# Patient Record
Sex: Female | Born: 1975
Health system: Southern US, Community
[De-identification: ages and names within clinical notes are randomized; demographics above are authoritative.]

## PROBLEM LIST (undated history)

## (undated) DIAGNOSIS — F32A Depression, unspecified: Secondary | ICD-10-CM

## (undated) DIAGNOSIS — G43909 Migraine, unspecified, not intractable, without status migrainosus: Secondary | ICD-10-CM

## (undated) DIAGNOSIS — F419 Anxiety disorder, unspecified: Secondary | ICD-10-CM

## (undated) DIAGNOSIS — D571 Sickle-cell disease without crisis: Secondary | ICD-10-CM

## (undated) DIAGNOSIS — F329 Major depressive disorder, single episode, unspecified: Secondary | ICD-10-CM

## (undated) DIAGNOSIS — B009 Herpesviral infection, unspecified: Secondary | ICD-10-CM

## (undated) DIAGNOSIS — I10 Essential (primary) hypertension: Secondary | ICD-10-CM

## (undated) HISTORY — DX: Major depressive disorder, single episode, unspecified: F32.9

## (undated) HISTORY — DX: Essential (primary) hypertension: I10

## (undated) HISTORY — DX: Depression, unspecified: F32.A

## (undated) HISTORY — DX: Migraine, unspecified, not intractable, without status migrainosus: G43.909

## (undated) HISTORY — PX: ENDOMETRIAL ABLATION: SHX621

## (undated) HISTORY — PX: WISDOM TOOTH EXTRACTION: SHX21

## (undated) HISTORY — DX: Anxiety disorder, unspecified: F41.9

## (undated) HISTORY — DX: Sickle-cell disease without crisis: D57.1

## (undated) HISTORY — DX: Herpesviral infection, unspecified: B00.9

---

## 1999-07-12 ENCOUNTER — Other Ambulatory Visit: Admission: RE | Admit: 1999-07-12 | Discharge: 1999-07-12 | Payer: Self-pay | Admitting: Obstetrics and Gynecology

## 1999-09-05 ENCOUNTER — Encounter (INDEPENDENT_AMBULATORY_CARE_PROVIDER_SITE_OTHER): Payer: Self-pay

## 1999-09-05 ENCOUNTER — Other Ambulatory Visit: Admission: RE | Admit: 1999-09-05 | Discharge: 1999-09-05 | Payer: Self-pay | Admitting: *Deleted

## 2000-02-06 ENCOUNTER — Other Ambulatory Visit: Admission: RE | Admit: 2000-02-06 | Discharge: 2000-02-06 | Payer: Self-pay | Admitting: *Deleted

## 2000-02-07 ENCOUNTER — Encounter (INDEPENDENT_AMBULATORY_CARE_PROVIDER_SITE_OTHER): Payer: Self-pay | Admitting: Specialist

## 2000-02-07 ENCOUNTER — Other Ambulatory Visit: Admission: RE | Admit: 2000-02-07 | Discharge: 2000-02-07 | Payer: Self-pay | Admitting: *Deleted

## 2001-08-11 ENCOUNTER — Other Ambulatory Visit: Admission: RE | Admit: 2001-08-11 | Discharge: 2001-08-11 | Payer: Self-pay | Admitting: Internal Medicine

## 2002-05-30 ENCOUNTER — Encounter: Admission: RE | Admit: 2002-05-30 | Discharge: 2002-05-30 | Payer: Self-pay | Admitting: Internal Medicine

## 2002-05-30 ENCOUNTER — Encounter: Payer: Self-pay | Admitting: Internal Medicine

## 2002-05-30 ENCOUNTER — Encounter (INDEPENDENT_AMBULATORY_CARE_PROVIDER_SITE_OTHER): Payer: Self-pay | Admitting: Specialist

## 2002-11-18 ENCOUNTER — Encounter: Payer: Self-pay | Admitting: Obstetrics & Gynecology

## 2002-11-18 ENCOUNTER — Ambulatory Visit (HOSPITAL_COMMUNITY): Admission: RE | Admit: 2002-11-18 | Discharge: 2002-11-18 | Payer: Self-pay | Admitting: Obstetrics & Gynecology

## 2008-08-30 ENCOUNTER — Inpatient Hospital Stay (HOSPITAL_COMMUNITY): Admission: AD | Admit: 2008-08-30 | Discharge: 2008-08-31 | Payer: Self-pay | Admitting: Obstetrics and Gynecology

## 2008-09-02 ENCOUNTER — Inpatient Hospital Stay (HOSPITAL_COMMUNITY): Admission: AD | Admit: 2008-09-02 | Discharge: 2008-09-02 | Payer: Self-pay | Admitting: Obstetrics and Gynecology

## 2009-11-29 ENCOUNTER — Inpatient Hospital Stay (HOSPITAL_COMMUNITY): Admission: AD | Admit: 2009-11-29 | Discharge: 2009-11-29 | Payer: Self-pay | Admitting: Obstetrics and Gynecology

## 2009-12-29 ENCOUNTER — Inpatient Hospital Stay (HOSPITAL_COMMUNITY): Admission: AD | Admit: 2009-12-29 | Discharge: 2009-12-29 | Payer: Self-pay | Admitting: Obstetrics and Gynecology

## 2010-01-03 ENCOUNTER — Observation Stay (HOSPITAL_COMMUNITY): Admission: RE | Admit: 2010-01-03 | Discharge: 2010-01-03 | Payer: Self-pay | Admitting: Obstetrics and Gynecology

## 2010-01-06 ENCOUNTER — Observation Stay (HOSPITAL_COMMUNITY): Admission: AD | Admit: 2010-01-06 | Discharge: 2010-01-07 | Payer: Self-pay | Admitting: Obstetrics and Gynecology

## 2010-01-22 ENCOUNTER — Inpatient Hospital Stay (HOSPITAL_COMMUNITY): Admission: RE | Admit: 2010-01-22 | Discharge: 2010-01-24 | Payer: Self-pay | Admitting: Obstetrics and Gynecology

## 2010-01-23 ENCOUNTER — Encounter (INDEPENDENT_AMBULATORY_CARE_PROVIDER_SITE_OTHER): Payer: Self-pay | Admitting: Obstetrics and Gynecology

## 2010-07-25 LAB — COMPREHENSIVE METABOLIC PANEL
ALT: 13 U/L (ref 0–35)
AST: 30 U/L (ref 0–37)
Albumin: 2.7 g/dL — ABNORMAL LOW (ref 3.5–5.2)
CO2: 26 mEq/L (ref 19–32)
Chloride: 105 mEq/L (ref 96–112)
GFR calc Af Amer: >60 mL/min (ref >60)
GFR calc non Af Amer: >60 mL/min (ref >60)
Potassium: 3.8 mEq/L (ref 3.5–5.1)
Sodium: 139 mEq/L (ref 135–145)
Total Bilirubin: 0.8 mg/dL (ref 0.3–1.2)

## 2010-07-25 LAB — CBC
Hemoglobin: 11.1 g/dL — ABNORMAL LOW (ref 12.0–15.0)
Hemoglobin: 12.2 g/dL (ref 12.0–15.0)
MCH: 33.4 pg (ref 26.0–34.0)
MCHC: 33.4 g/dL (ref 30.0–36.0)
MCV: 99.9 fL (ref 78.0–100.0)
Platelets: 180 10*3/uL (ref 150–400)
RBC: 3.66 MIL/uL — ABNORMAL LOW (ref 3.87–5.11)
WBC: 20.2 10*3/uL — ABNORMAL HIGH (ref 4.0–10.5)

## 2010-07-25 LAB — URINALYSIS, ROUTINE W REFLEX MICROSCOPIC
Bilirubin Urine: NEGATIVE
Glucose, UA: NEGATIVE mg/dL
Ketones, ur: 15 mg/dL — AB
Nitrite: NEGATIVE
pH: 7.5 (ref 5.0–8.0)

## 2010-07-25 LAB — URINE MICROSCOPIC-ADD ON

## 2010-07-25 LAB — RPR: RPR Ser Ql: NONREACTIVE

## 2010-07-26 LAB — CBC
MCV: 99.2 fL (ref 78.0–100.0)
Platelets: 167 10*3/uL (ref 150–400)
RBC: 3.42 MIL/uL — ABNORMAL LOW (ref 3.87–5.11)
RDW: 12.2 % (ref 11.5–15.5)
WBC: 12.8 10*3/uL — ABNORMAL HIGH (ref 4.0–10.5)

## 2010-07-26 LAB — URINE MICROSCOPIC-ADD ON

## 2010-07-26 LAB — URINALYSIS, ROUTINE W REFLEX MICROSCOPIC
Bilirubin Urine: NEGATIVE
Glucose, UA: NEGATIVE mg/dL
Hgb urine dipstick: NEGATIVE
Protein, ur: NEGATIVE mg/dL
Urobilinogen, UA: 0.2 mg/dL (ref 0.0–1.0)

## 2010-07-27 LAB — URINE MICROSCOPIC-ADD ON

## 2010-07-27 LAB — URINALYSIS, ROUTINE W REFLEX MICROSCOPIC
Hgb urine dipstick: NEGATIVE
Nitrite: NEGATIVE
Protein, ur: NEGATIVE mg/dL
Specific Gravity, Urine: 1.01 (ref 1.005–1.030)
Urobilinogen, UA: 0.2 mg/dL (ref 0.0–1.0)

## 2010-08-21 LAB — CBC
HCT: 36.5 % (ref 36.0–46.0)
MCHC: 33.9 g/dL (ref 30.0–36.0)
MCV: 96.2 fL (ref 78.0–100.0)
Platelets: 281 10*3/uL (ref 150–400)
RDW: 11.4 % — ABNORMAL LOW (ref 11.5–15.5)
WBC: 7.1 10*3/uL (ref 4.0–10.5)

## 2010-08-21 LAB — HCG, QUANTITATIVE, PREGNANCY: hCG, Beta Chain, Quant, S: 85 m[IU]/mL — ABNORMAL HIGH (ref <5)

## 2010-08-21 LAB — TYPE AND SCREEN

## 2013-02-07 ENCOUNTER — Other Ambulatory Visit: Payer: Self-pay | Admitting: Obstetrics and Gynecology

## 2013-02-07 ENCOUNTER — Other Ambulatory Visit (HOSPITAL_COMMUNITY)
Admission: RE | Admit: 2013-02-07 | Discharge: 2013-02-07 | Disposition: A | Discharge status: Home / Self Care | Payer: No Typology Code available for payment source | Source: Ambulatory Visit | Attending: Obstetrics and Gynecology | Admitting: Obstetrics and Gynecology

## 2013-02-07 DIAGNOSIS — Z01419 Encounter for gynecological examination (general) (routine) without abnormal findings: Secondary | ICD-10-CM | POA: Insufficient documentation

## 2013-02-07 DIAGNOSIS — Z1151 Encounter for screening for human papillomavirus (HPV): Secondary | ICD-10-CM | POA: Insufficient documentation

## 2013-03-23 ENCOUNTER — Telehealth: Payer: Self-pay | Admitting: *Deleted

## 2013-03-23 NOTE — Telephone Encounter (Signed)
Coumadin orders faxed to [facility].

## 2014-07-12 ENCOUNTER — Ambulatory Visit (INDEPENDENT_AMBULATORY_CARE_PROVIDER_SITE_OTHER): Payer: Managed Care, Other (non HMO) | Admitting: Physician Assistant

## 2014-07-12 VITALS — BP 118/78 | HR 93 | Temp 98.3°F | Resp 16 | Ht 65.5 in | Wt 141.0 lb

## 2014-07-12 DIAGNOSIS — M7989 Other specified soft tissue disorders: Secondary | ICD-10-CM | POA: Diagnosis not present

## 2014-07-12 DIAGNOSIS — R519 Headache, unspecified: Secondary | ICD-10-CM

## 2014-07-12 DIAGNOSIS — R252 Cramp and spasm: Secondary | ICD-10-CM | POA: Diagnosis not present

## 2014-07-12 DIAGNOSIS — I10 Essential (primary) hypertension: Secondary | ICD-10-CM | POA: Diagnosis not present

## 2014-07-12 DIAGNOSIS — D573 Sickle-cell trait: Secondary | ICD-10-CM | POA: Diagnosis not present

## 2014-07-12 DIAGNOSIS — R42 Dizziness and giddiness: Secondary | ICD-10-CM | POA: Diagnosis not present

## 2014-07-12 DIAGNOSIS — R51 Headache: Secondary | ICD-10-CM

## 2014-07-12 LAB — BASIC METABOLIC PANEL
BUN: 9 mg/dL (ref 6–23)
CALCIUM: 9.5 mg/dL (ref 8.4–10.5)
CO2: 29 mEq/L (ref 19–32)
CREATININE: 0.65 mg/dL (ref 0.50–1.10)
Chloride: 102 mEq/L (ref 96–112)
Glucose, Bld: 86 mg/dL (ref 70–99)
Potassium: 3.8 mEq/L (ref 3.5–5.3)
Sodium: 139 mEq/L (ref 135–145)

## 2014-07-12 LAB — POCT UA - MICROSCOPIC ONLY
Bacteria, U Microscopic: NEGATIVE
CASTS, UR, LPF, POC: NEGATIVE
CRYSTALS, UR, HPF, POC: NEGATIVE
Mucus, UA: NEGATIVE
YEAST UA: NEGATIVE

## 2014-07-12 LAB — POCT URINALYSIS DIPSTICK
Bilirubin, UA: NEGATIVE
GLUCOSE UA: NEGATIVE
Ketones, UA: NEGATIVE
Leukocytes, UA: NEGATIVE
NITRITE UA: NEGATIVE
PH UA: 8
Protein, UA: NEGATIVE
RBC UA: NEGATIVE
Spec Grav, UA: 1.015
UROBILINOGEN UA: 0.2

## 2014-07-12 MED ORDER — LISINOPRIL-HYDROCHLOROTHIAZIDE 20-25 MG PO TABS: 1.0000 | ORAL_TABLET | Freq: Every day | ORAL | Status: DC

## 2014-07-12 NOTE — Progress Notes (Signed)
Subjective:    Patient ID: Shirley Nunez, female    DOB: 1975-07-14, 39 y.o.   MRN: [id number]  Chief Complaint  Patient presents with  . Headache   Patient Active Problem List   Diagnosis Date Noted  . Essential hypertension 07/12/2014    Prior to Admission medications   Medication Sig Start Date End Date Taking? Authorizing Provider  lisinopril-hydrochlorothiazide (PRINZIDE,ZESTORETIC) 20-25 MG per tablet Take 1 tablet by mouth daily.   Yes Historical Provider, MD   Medications, allergies, past medical history, surgical history, family history, social history and problem list reviewed and updated.  HPI  80 yof with pmh htn presents with headaches, dizziness, and le swelling.   LE swelling - Has hx htn for past 10 yrs. Got worse after birth of her 2nd child. Doesn't think she's been worked up for secondary causes. Both parents have htn. Currently well controlled on current med. She feels like sometimes she deals with mild bilateral le swelling over past few yrs. Feels that it has become worse over past few weeks. She doesn't really watch her sodium intake. Has never worn compression stockings. Drinks 4-5 glasses water daily. Denies cp, sob. Checks her bp at home once week runs 120-130s/80-90s.   HA/dizziness - Has had mild bilateral ha across forehead and behind both eyes for past few weeks. They typically get worse as day goes on. Denies unilateral ha. Denies vision changes or muscle weakness. Denies photophobia/phonophobia. Denies worst ha of life. Denies sudden onset. Denies fever, chills. Ibuprofen dulls ha but doesn't take it away. Drinks 2-3 cups coffee day. Drinks occasional 1-2 glasses wine at night. Drinks 4-5 glasses water day. Gets dizziness when she bends forward and brings head back up. Denies palps. Denies tinnitus, hearing loss. Saw optometry one month ago, no changes in vision.   Review of Systems See HPI.     Objective:   Physical Exam  Constitutional: She is  oriented to person, place, and time. She appears well-developed and well-nourished.  Non-toxic appearance. She does not have a sickly appearance. She does not appear ill. No distress.  BP 118/78 mmHg  Pulse 93  Temp(Src) 98.3 F (36.8 C) (Oral)  Resp 16  Ht 5' 5.5" (1.664 m)  Wt 141 lb (63.957 kg)  BMI 23.10 kg/m2  SpO2 100%   HENT:  Right Ear: Tympanic membrane is not erythematous. A middle ear effusion is present.  Left Ear: Tympanic membrane is not erythematous. A middle ear effusion is present.  Nose: No mucosal edema or rhinorrhea. Right sinus exhibits frontal sinus tenderness. Right sinus exhibits no maxillary sinus tenderness. Left sinus exhibits frontal sinus tenderness. Left sinus exhibits no maxillary sinus tenderness.  Mouth/Throat: Mucous membranes are normal. Mucous membranes are not dry.  Mild frontal sinus ttp bilaterally.   Eyes: Conjunctivae and EOM are normal. Pupils are equal, round, and reactive to light.  Neck: No Brudzinski's sign noted.  Cardiovascular: Normal rate, regular rhythm and normal heart sounds.  Exam reveals no gallop and no decreased pulses.   No murmur heard. Pulmonary/Chest: Effort normal and breath sounds normal.  Musculoskeletal:       Right lower leg: She exhibits no swelling and no edema.       Left lower leg: She exhibits no swelling and no edema.  No bilateral le edema.   Neurological: She is alert and oriented to person, place, and time. She has normal strength. No cranial nerve deficit or sensory deficit. She displays a negative Romberg sign.  Coordination and gait normal.  Normal rapid alternating movements, heel to shin.  Negative dix-halpike testing.   Skin: Skin is warm and dry. She is not diaphoretic.  No increased turgor.   Psychiatric: She has a normal mood and affect. Her speech is normal and behavior is normal.   Results for orders placed or performed in visit on 07/12/14  POCT urinalysis dipstick  Result Value Ref Range    Color, UA yellow    Clarity, UA clear    Glucose, UA neg    Bilirubin, UA neg    Ketones, UA neg    Spec Grav, UA 1.015    Blood, UA neg    pH, UA 8.0    Protein, UA neg    Urobilinogen, UA 0.2    Nitrite, UA neg    Leukocytes, UA Negative   POCT UA - Microscopic Only  Result Value Ref Range   WBC, Ur, HPF, POC 0-1    RBC, urine, microscopic 0-3    Bacteria, U Microscopic neg    Mucus, UA neg    Epithelial cells, urine per micros 0-6    Crystals, Ur, HPF, POC neg    Casts, Ur, LPF, POC neg    Yeast, UA neg       Assessment & Plan:   89 yof with pmh htn presents with headaches, dizziness, and le swelling.   Swelling of lower extremity - Plan: POCT urinalysis dipstick, POCT UA - Microscopic Only --no pitting edema on exam --normal hydration on ua --compression stockings/elevate at night/limit sodium/increase water intake --rtc if not resolving  Dizziness - Plan: POCT urinalysis dipstick, POCT UA - Microscopic Only, Basic metabolic panel --normal neuro exam, neg dix-halpike, no assoc palps --low concern for peripheral causes with no tinnitus, hearing loss, low concern for central causes with normal neuro exam --possibly due to mild dehydration as it comes on after lifting head up --rtc if not resolved with below measures   Nonintractable headache, unspecified chronicity pattern, unspecified headache type --not sudden onset, not worst of life, no fever --possibly due to congestion with tm effusions and mild sinus ttp --decongestant next few days, if resolves ha then flonase/zyrtec through allergy season --if no relief with decongestant, could be tension related, hydration and tylenol prn --rtc if no relief with these measures  Cramp of both lower extremities - Plan: Basic metabolic panel --lytes today  Essential hypertension - Plan: lisinopril-hydrochlorothiazide (PRINZIDE,ZESTORETIC) 20-25 MG per tablet --well controlled in clinic and at home per pt --refilled for 6  months --bmp today for bun/cr/k  Julieta Gutting, PA-C Physician Assistant-Certified [facility]  07/12/2014 11:12 AM

## 2014-07-12 NOTE — Patient Instructions (Signed)
I think we should stay on the same blood pressure dose for now. If you notice the swelling increasing, buying some compression stockings may help. You can wear these when you're on your feet a lot during the day. Keeping your feet elevated at night may help. Watching your sodium intake and drinking at least 8 glasses water daily will help. Continue to monitor your bp at home and let us know if its running >140/90 consistently.  For the headaches, it may help to take a decongestant every day every 6 hours for the next 2-3 days. See if this helps. Tylenol may be a better agent for you to help with the headache.  If they get worse or don't go away, please come back or go to the ED.  If you're not feeling better with these issues in a few weeks, please come back to see [place] for further workup. I'll let you know the results of your lab draw.

## 2014-08-10 ENCOUNTER — Ambulatory Visit (INDEPENDENT_AMBULATORY_CARE_PROVIDER_SITE_OTHER): Payer: Managed Care, Other (non HMO) | Admitting: Sports Medicine

## 2014-08-10 VITALS — BP 110/80 | HR 77 | Temp 98.5°F | Ht 66.0 in | Wt 139.2 lb

## 2014-08-10 DIAGNOSIS — N9089 Other specified noninflammatory disorders of vulva and perineum: Secondary | ICD-10-CM

## 2014-08-10 MED ORDER — BACITRACIN 500 UNIT/GM EX OINT: 1.0000 "application " | TOPICAL_OINTMENT | Freq: Two times a day (BID) | CUTANEOUS | Status: DC

## 2014-08-10 NOTE — Progress Notes (Signed)
  Shirley Nunez - 39 y.o. female MRN [id number]  Date of birth: 08-19-1975  SUBJECTIVE:  Including CC & ROS.  patient C/O: vaginal lesion on the right labial fold Onset of symptoms: 1 week Symptoms:  Patient was concerned she cut herself shaving. She developed a lesion on her labia she is is concerned to be a herpes outbreak, she has NO history of herpes. She describes a tenderness but no burning. Relieving factors: Nothing applied topically  Worsened by: contact cause more pain no pain with urination No new sexual partners in monogamous married for 13 years Denies any vaginal discharge, no pelvic pain, no urinary urgency/ frequent/ or dysuria.    ROS:  Constitutional:  No fever, chills, or fatigue.  Respiratory:  No shortness of breath, cough, or wheezing Cardiovascular:  No palpitations, chest pain or syncope Gastrointestinal:  No nausea, no abdominal pain Review of systems otherwise negative except for what is stated in HPI  HISTORY: Past Medical, Surgical, Social, and Family History Reviewed & Updated per EMR. Pertinent Historical Findings include: HTN  PHYSICAL EXAM:  VS: BP:110/80 mmHg  HR:77bpm  TEMP:98.5 F (36.9 C)(Oral)  RESP:100 %  HT:5\' 6"  (167.6 cm)   WT:139 lb 4 oz (63.163 kg)  BMI:22.5 PHYSICAL EXAM: General:  Alert and oriented, No acute distress.   HENT:  Normocephalic, Oral mucosa is moist.   Respiratory:  Lungs are clear to auscultation, Respirations are non-labored, Symmetrical chest wall expansion.   Cardiovascular:  Normal rate, Regular rhythm, No murmur, Good pulses equal in all extremities, No edema.   Gastrointestinal:  Soft, Non-tender, Non-distended, Normal bowel sounds, No organomegaly.   Integumentary:  Warm, Dry, No rash.   GU Exam: Labial folds: small erythyma tender follicular irritation, no vesicles, no abcess Vagina: no discharge, no bleeding, no vaginal atrophy or irritation. No evidence of a rectocele, cystocele or enterocele Cervix: not  dilated, no erythema, no cervical motion tenderness, no adnexal tenderness  Neurologic:  Alert, Oriented, No focal defects Psychiatric:  Cooperative, Appropriate mood & affect.    ASSESSMENT & PLAN:  Impression: Bacterial folliculitis of labial fold  Recommendations: - With no history of herpes outbreak, no new sexual partners, and no burning sensation suspect a localized folliculitis from shaving -Viral culture obtain to r/o herpes for sure -Rx for topical bacitracin ointment BID for 1-2 weeks - Abstain from shave and sexual intercourse for 1-2 week -will call with lab results, f/u PRN -Patient decline other STD test she was recently tested for GC/Chlamydia by GYN

## 2014-08-11 NOTE — Addendum Note (Signed)
Addended by: Louie Casa on: 08/11/2014 08:46 AM   Modules accepted: Orders

## 2014-08-16 ENCOUNTER — Other Ambulatory Visit: Payer: Self-pay | Admitting: Sports Medicine

## 2014-08-16 DIAGNOSIS — A6009 Herpesviral infection of other urogenital tract: Secondary | ICD-10-CM | POA: Insufficient documentation

## 2014-08-16 LAB — HERPES SIMPLEX VIRUS CULTURE: ORGANISM ID, BACTERIA: DETECTED

## 2014-08-16 MED ORDER — VALACYCLOVIR HCL 1 G PO TABS: 1000.0000 mg | ORAL_TABLET | Freq: Two times a day (BID) | ORAL | Status: DC

## 2014-08-22 ENCOUNTER — Telehealth: Payer: Self-pay

## 2014-08-22 DIAGNOSIS — A6000 Herpesviral infection of urogenital system, unspecified: Secondary | ICD-10-CM

## 2014-08-22 MED ORDER — ACYCLOVIR 200 MG PO CAPS: 200.0000 mg | ORAL_CAPSULE | Freq: Every day | ORAL | Status: DC

## 2014-08-22 NOTE — Telephone Encounter (Signed)
Pharm faxed note that valtrex/valacyclovir is not covered by pt's ins, and asked for change to acyclovir 200 or 800 mg.

## 2014-08-22 NOTE — Telephone Encounter (Signed)
Rx for acyclovir 200 mg sent to pharmacy.

## 2014-12-09 ENCOUNTER — Other Ambulatory Visit: Payer: Self-pay | Admitting: Physician Assistant

## 2015-05-15 ENCOUNTER — Other Ambulatory Visit: Payer: Self-pay | Admitting: Physician Assistant

## 2015-06-03 ENCOUNTER — Ambulatory Visit (INDEPENDENT_AMBULATORY_CARE_PROVIDER_SITE_OTHER): Payer: BLUE CROSS/BLUE SHIELD | Admitting: Emergency Medicine

## 2015-06-03 VITALS — BP 112/72 | HR 94 | Temp 98.3°F | Resp 18 | Ht 66.0 in | Wt 134.0 lb

## 2015-06-03 DIAGNOSIS — Z0000 Encounter for general adult medical examination without abnormal findings: Secondary | ICD-10-CM | POA: Diagnosis not present

## 2015-06-03 DIAGNOSIS — Z124 Encounter for screening for malignant neoplasm of cervix: Secondary | ICD-10-CM

## 2015-06-03 DIAGNOSIS — I10 Essential (primary) hypertension: Secondary | ICD-10-CM | POA: Diagnosis not present

## 2015-06-03 DIAGNOSIS — Z23 Encounter for immunization: Secondary | ICD-10-CM

## 2015-06-03 DIAGNOSIS — D573 Sickle-cell trait: Secondary | ICD-10-CM | POA: Diagnosis not present

## 2015-06-03 DIAGNOSIS — N898 Other specified noninflammatory disorders of vagina: Secondary | ICD-10-CM

## 2015-06-03 DIAGNOSIS — R634 Abnormal weight loss: Secondary | ICD-10-CM

## 2015-06-03 DIAGNOSIS — R1030 Lower abdominal pain, unspecified: Secondary | ICD-10-CM

## 2015-06-03 LAB — COMPLETE METABOLIC PANEL WITH GFR
ALT: 10 U/L (ref 6–29)
AST: 15 U/L (ref 10–30)
Albumin: 4.2 g/dL (ref 3.6–5.1)
Alkaline Phosphatase: 42 U/L (ref 33–115)
BILIRUBIN TOTAL: 0.5 mg/dL (ref 0.2–1.2)
BUN: 12 mg/dL (ref 7–25)
CO2: 32 mmol/L — ABNORMAL HIGH (ref 20–31)
CREATININE: 0.71 mg/dL (ref 0.50–1.10)
Calcium: 9.4 mg/dL (ref 8.6–10.2)
Chloride: 102 mmol/L (ref 98–110)
GFR, Est Non African American: >89 mL/min (ref >=60)
GLUCOSE: 77 mg/dL (ref 65–99)
Potassium: 3.6 mmol/L (ref 3.5–5.3)
Sodium: 140 mmol/L (ref 135–146)
TOTAL PROTEIN: 7.6 g/dL (ref 6.1–8.1)

## 2015-06-03 LAB — POCT URINALYSIS DIP (MANUAL ENTRY)
BILIRUBIN UA: NEGATIVE
Blood, UA: NEGATIVE
GLUCOSE UA: NEGATIVE
Ketones, POC UA: NEGATIVE
NITRITE UA: NEGATIVE
PH UA: 7
Protein Ur, POC: NEGATIVE
SPEC GRAV UA: 1.015
UROBILINOGEN UA: 1

## 2015-06-03 LAB — POCT WET + KOH PREP: TRICH BY WET PREP: ABSENT

## 2015-06-03 LAB — POCT CBC
GRANULOCYTE PERCENT: 70.3 % (ref 37–80)
HCT, POC: 33.3 % — AB (ref 37.7–47.9)
HEMOGLOBIN: 11.3 g/dL — AB (ref 12.2–16.2)
Lymph, poc: 1.6 (ref 0.6–3.4)
MCH: 32.5 pg — AB (ref 27–31.2)
MCHC: 33.9 g/dL (ref 31.8–35.4)
MCV: 95.8 fL (ref 80–97)
MID (cbc): 0.2 (ref 0–0.9)
MPV: 7.9 fL (ref 0–99.8)
POC Granulocyte: 4.3 (ref 2–6.9)
POC LYMPH PERCENT: 26.2 %L (ref 10–50)
POC MID %: 3.5 %M (ref 0–12)
Platelet Count, POC: 312 10*3/uL (ref 142–424)
RBC: 3.48 M/uL — AB (ref 4.04–5.48)
RDW, POC: 12.2 %
WBC: 6.1 10*3/uL (ref 4.6–10.2)

## 2015-06-03 LAB — POC MICROSCOPIC URINALYSIS (UMFC): Mucus: ABSENT

## 2015-06-03 LAB — GLUCOSE, POCT (MANUAL RESULT ENTRY): POC Glucose: 91 mg/dl (ref 70–99)

## 2015-06-03 LAB — HEPATITIS C ANTIBODY: HCV Ab: NEGATIVE

## 2015-06-03 LAB — HIV ANTIBODY (ROUTINE TESTING W REFLEX): HIV 1&2 Ab, 4th Generation: NONREACTIVE

## 2015-06-03 LAB — POCT URINE PREGNANCY: PREG TEST UR: NEGATIVE

## 2015-06-03 LAB — TSH: TSH: 0.995 u[IU]/mL (ref 0.350–4.500)

## 2015-06-03 MED ORDER — METRONIDAZOLE 500 MG PO TABS: 500.0000 mg | ORAL_TABLET | Freq: Three times a day (TID) | ORAL | Status: DC

## 2015-06-03 MED ORDER — LISINOPRIL-HYDROCHLOROTHIAZIDE 20-25 MG PO TABS: ORAL_TABLET | ORAL | Status: DC

## 2015-06-03 MED ORDER — FLUCONAZOLE 150 MG PO TABS: 150.0000 mg | ORAL_TABLET | Freq: Once | ORAL | Status: DC

## 2015-06-03 NOTE — Progress Notes (Signed)
By signing my name below, I, Judithe Modest, attest that this documentation has been prepared under the direction and in the presence of Nena Jordan, MD. Electronically Signed: Judithe Modest, ER Scribe. 06/03/2015. 9:06 AM.  Chief Complaint:  Chief Complaint  Patient presents with  . Annual Exam  . Medication Refill    lisinopril-HCTZ    HPI: Shirley Nunez is a 40 y.o. female with a past hx of HTN and sickle cell trait who reports to [facility] today reporting for a full physical. She does not currently have an OBGYN. Her OBGYN used to be at [facility]. She is not sure where she would like to be seen for her GYN services now. She does not exercise much though she does walk around all day for work. She has lost 10 lbs in the last two months unintentionally. She has a past hx of uterine oblation and tubal ligation surgeries. She is not UTD on her tetanus and would like a shot. She states her appetite has been normal. She is married and has two children who are 49 and 57 y/o.  She is also complaining of a watery vaginal discharge for the last week. She denies dysuria or any other urinary sx.   Past Medical History  Diagnosis Date  . Anxiety   . Hypertension   . Sickle cell anemia (HCC)     Sickle cell trait   History reviewed. No pertinent past surgical history. Social History   Social History  . Marital Status: Married    Spouse Name: N/A  . Number of Children: N/A  . Years of Education: N/A   Social History Main Topics  . Smoking status: Never Smoker   . Smokeless tobacco: None  . Alcohol Use: 1.2 oz/week    0 Standard drinks or equivalent, 2 Glasses of wine per week  . Drug Use: No  . Sexual Activity: Not Asked   Other Topics Concern  . None   Social History Narrative   Family History  Problem Relation Age of Onset  . Hypertension Mother   . Hypertension Father   . Hyperlipidemia Father   . Diabetes Father    Allergies  Allergen Reactions  . Penicillins  Rash   Prior to Admission medications   Medication Sig Start Date End Date Taking? Authorizing Provider  lisinopril-hydrochlorothiazide (PRINZIDE,ZESTORETIC) 20-25 MG tablet TAKE ONE TABLET BY MOUTH ONCE DAILY 05/15/15  Yes [name], PA-C     ROS: The patient denies fevers, chills, night sweats, chest pain, palpitations, wheezing, dyspnea on exertion, nausea, vomiting, abdominal pain, dysuria, hematuria, melena, numbness, weakness, or tingling.  All other systems have been reviewed and were otherwise negative with the exception of those mentioned in the HPI and as above.    PHYSICAL EXAM: Filed Vitals:   06/03/15 0841  BP: 112/72  Pulse: 94  Temp: 98.3 F (36.8 C)  Resp: 18   Body mass index is 21.64 kg/(m^2).   General: Alert, no acute distress HEENT:  Normocephalic, atraumatic, oropharynx patent. Eye: Juliette Mangle [facility] Cardiovascular:  Regular rate and rhythm, no rubs murmurs or gallops.  No Carotid bruits, radial pulse intact. No pedal edema.  Respiratory: Clear to auscultation bilaterally.  No wheezes, rales, or rhonchi.  No cyanosis, no use of accessory musculature Abdominal: No organomegaly, abdomen is soft, positive bowel sounds.  No masses. Mild suprapubic and LLQ tenderness.  Musculoskeletal: Gait intact. No edema, tenderness Skin: No rashes. Neurologic: Facial musculature symmetric. Psychiatric: Patient acts appropriately throughout  our interaction. Lymphatic: No cervical or submandibular lymphadenopathy   LABS: Results for orders placed or performed in visit on 06/03/15  POCT CBC  Result Value Ref Range   WBC 6.1 4.6 - 10.2 K/uL   Lymph, poc 1.6 0.6 - 3.4   POC LYMPH PERCENT 26.2 10 - 50 %L   MID (cbc) 0.2 0 - 0.9   POC MID % 3.5 0 - 12 %M   POC Granulocyte 4.3 2 - 6.9   Granulocyte percent 70.3 37 - 80 %G   RBC 3.48 (A) 4.04 - 5.48 M/uL   Hemoglobin 11.3 (A) 12.2 - 16.2 g/dL   HCT, POC 33.3 (A) 37.7 - 47.9 %   MCV 95.8 80 - 97 fL   MCH, POC 32.5 (A) 27  - 31.2 pg   MCHC 33.9 31.8 - 35.4 g/dL   RDW, POC 12.2 %   Platelet Count, POC 312 142 - 424 K/uL   MPV 7.9 0 - 99.8 fL  POCT urinalysis dipstick  Result Value Ref Range   Color, UA yellow yellow   Clarity, UA clear clear   Glucose, UA negative negative   Bilirubin, UA negative negative   Ketones, POC UA negative negative   Spec Grav, UA 1.015    Blood, UA negative negative   pH, UA 7.0    Protein Ur, POC negative negative   Urobilinogen, UA 1.0    Nitrite, UA Negative Negative   Leukocytes, UA small (1+) (A) Negative  POCT Microscopic Urinalysis ([facility])  Result Value Ref Range   WBC,UR,HPF,POC Few (A) None WBC/hpf   RBC,UR,HPF,POC None None RBC/hpf   Bacteria Few (A) None, Too numerous to count   Mucus Absent Absent   Epithelial Cells, UR Per Microscopy Few (A) None, Too numerous to count cells/hpf  POCT glucose (manual entry)  Result Value Ref Range   POC Glucose 91 70 - 99 mg/dl  POCT Wet + KOH Prep  Result Value Ref Range   Yeast by KOH Present Present, Absent   Yeast by wet prep Present Present, Absent   WBC by wet prep Too numerous to count  None, Few, Too numerous to count   Clue Cells Wet Prep HPF POC Moderate (A) None, Too numerous to count   Trich by wet prep Absent Present, Absent   Bacteria Wet Prep HPF POC Moderate (A) None, Few, Too numerous to count   Epithelial Cells By [employer] Pref ([facility]) Moderate (A) None, Few, Too numerous to count   RBC,UR,HPF,POC None None RBC/hpf    EKG/XRAY:   Primary read interpreted by Dr. Everlene Farrier at [facility].   ASSESSMENT/PLAN: We'll treat with Diflucan and Flagyl. She will continue her lisinopril for blood pressure control. Routine labs were done to evaluate weight loss. Pelvic ultrasound scheduled because of left adnexal discomfort. She declined a flu shot today. Tetanus shot will be given.I personally performed the services described in this documentation, which was scribed in my presence. The recorded information has been reviewed and  is accurate.  Gross sideeffects, risk and benefits, and alternatives of medications d/w patient. Patient is aware that all medications have potential sideeffects and we are unable to predict every sideeffect or drug-drug interaction that may occur.  Arlyss Queen MD 06/03/2015 9:06 AM

## 2015-06-03 NOTE — Progress Notes (Signed)
GYN exam - external genitalia appearance WNL, thick yellow-green discharge in vaginal canal, cervix without erythema or discharge, no CMT or uterine masses - slight TTP left pelvic area but no palpable adnexal masses

## 2015-06-03 NOTE — Patient Instructions (Signed)
Health Maintenance, Female Adopting a healthy lifestyle and getting preventive care can go a long way to promote health and wellness. Talk with your health care provider about what schedule of regular examinations is right for you. This is a good chance for you to check in with your provider about disease prevention and staying healthy. In between checkups, there are plenty of things you can do on your own. Experts have done a lot of research about which lifestyle changes and preventive measures are most likely to keep you healthy. Ask your health care provider for more information. WEIGHT AND DIET  Eat a healthy diet  Be sure to include plenty of vegetables, fruits, low-fat dairy products, and lean protein.  Do not eat a lot of foods high in solid fats, added sugars, or salt.  Get regular exercise. This is one of the most important things you can do for your health.  Most adults should exercise for at least 150 minutes each week. The exercise should increase your heart rate and make you sweat (moderate-intensity exercise).  Most adults should also do strengthening exercises at least twice a week. This is in addition to the moderate-intensity exercise.  Maintain a healthy weight  Body mass index (BMI) is a measurement that can be used to identify possible weight problems. It estimates body fat based on height and weight. Your health care provider can help determine your BMI and help you achieve or maintain a healthy weight.  For females 20 years of age and older:   A BMI below 18.5 is considered underweight.  A BMI of 18.5 to 24.9 is normal.  A BMI of 25 to 29.9 is considered overweight.  A BMI of 30 and above is considered obese.  Watch levels of cholesterol and blood lipids  You should start having your blood tested for lipids and cholesterol at 41 years of age, then have this test every 5 years.  You may need to have your cholesterol levels checked more often if:  Your lipid  or cholesterol levels are high.  You are older than 41 years of age.  You are at high risk for heart disease.  CANCER SCREENING   Lung Cancer  Lung cancer screening is recommended for adults 55-80 years old who are at high risk for lung cancer because of a history of smoking.  A yearly low-dose CT scan of the lungs is recommended for people who:  Currently smoke.  Have quit within the past 15 years.  Have at least a 30-pack-year history of smoking. A pack year is smoking an average of one pack of cigarettes a day for 1 year.  Yearly screening should continue until it has been 15 years since you quit.  Yearly screening should stop if you develop a health problem that would prevent you from having lung cancer treatment.  Breast Cancer  Practice breast self-awareness. This means understanding how your breasts normally appear and feel.  It also means doing regular breast self-exams. Let your health care provider know about any changes, no matter how small.  If you are in your 20s or 30s, you should have a clinical breast exam (CBE) by a health care provider every 1-3 years as part of a regular health exam.  If you are 40 or older, have a CBE every year. Also consider having a breast X-ray (mammogram) every year.  If you have a family history of breast cancer, talk to your health care provider about genetic screening.  If you   are at high risk for breast cancer, talk to your health care provider about having an MRI and a mammogram every year.  Breast cancer gene (BRCA) assessment is recommended for women who have family members with BRCA-related cancers. BRCA-related cancers include:  Breast.  Ovarian.  Tubal.  Peritoneal cancers.  Results of the assessment will determine the need for genetic counseling and BRCA1 and BRCA2 testing. Cervical Cancer Your health care provider may recommend that you be screened regularly for cancer of the pelvic organs (ovaries, uterus, and  vagina). This screening involves a pelvic examination, including checking for microscopic changes to the surface of your cervix (Pap test). You may be encouraged to have this screening done every 3 years, beginning at age 21.  For women ages 30-65, health care providers may recommend pelvic exams and Pap testing every 3 years, or they may recommend the Pap and pelvic exam, combined with testing for human papilloma virus (HPV), every 5 years. Some types of HPV increase your risk of cervical cancer. Testing for HPV may also be done on women of any age with unclear Pap test results.  Other health care providers may not recommend any screening for nonpregnant women who are considered low risk for pelvic cancer and who do not have symptoms. Ask your health care provider if a screening pelvic exam is right for you.  If you have had past treatment for cervical cancer or a condition that could lead to cancer, you need Pap tests and screening for cancer for at least 20 years after your treatment. If Pap tests have been discontinued, your risk factors (such as having a new sexual partner) need to be reassessed to determine if screening should resume. Some women have medical problems that increase the chance of getting cervical cancer. In these cases, your health care provider may recommend more frequent screening and Pap tests. Colorectal Cancer  This type of cancer can be detected and often prevented.  Routine colorectal cancer screening usually begins at 41 years of age and continues through 41 years of age.  Your health care provider may recommend screening at an earlier age if you have risk factors for colon cancer.  Your health care provider may also recommend using home test kits to check for hidden blood in the stool.  A small camera at the end of a tube can be used to examine your colon directly (sigmoidoscopy or colonoscopy). This is done to check for the earliest forms of colorectal  cancer.  Routine screening usually begins at age 50.  Direct examination of the colon should be repeated every 5-10 years through 41 years of age. However, you may need to be screened more often if early forms of precancerous polyps or small growths are found. Skin Cancer  Check your skin from head to toe regularly.  Tell your health care provider about any new moles or changes in moles, especially if there is a change in a mole's shape or color.  Also tell your health care provider if you have a mole that is larger than the size of a pencil eraser.  Always use sunscreen. Apply sunscreen liberally and repeatedly throughout the day.  Protect yourself by wearing long sleeves, pants, a wide-brimmed hat, and sunglasses whenever you are outside. HEART DISEASE, DIABETES, AND HIGH BLOOD PRESSURE   High blood pressure causes heart disease and increases the risk of stroke. High blood pressure is more likely to develop in:  People who have blood pressure in the high end   of the normal range (130-139/85-89 mm Hg).  People who are overweight or obese.  People who are African American.  If you are 38-23 years of age, have your blood pressure checked every 3-5 years. If you are 61 years of age or older, have your blood pressure checked every year. You should have your blood pressure measured twice--once when you are at a hospital or clinic, and once when you are not at a hospital or clinic. Record the average of the two measurements. To check your blood pressure when you are not at a hospital or clinic, you can use:  An automated blood pressure machine at a pharmacy.  A home blood pressure monitor.  If you are between 45 years and 39 years old, ask your health care provider if you should take aspirin to prevent strokes.  Have regular diabetes screenings. This involves taking a blood sample to check your fasting blood sugar level.  If you are at a normal weight and have a low risk for diabetes,  have this test once every three years after 41 years of age.  If you are overweight and have a high risk for diabetes, consider being tested at a younger age or more often. PREVENTING INFECTION  Hepatitis B  If you have a higher risk for hepatitis B, you should be screened for this virus. You are considered at high risk for hepatitis B if:  You were born in a country where hepatitis B is common. Ask your health care provider which countries are considered high risk.  Your parents were born in a high-risk country, and you have not been immunized against hepatitis B (hepatitis B vaccine).  You have HIV or AIDS.  You use needles to inject street drugs.  You live with someone who has hepatitis B.  You have had sex with someone who has hepatitis B.  You get hemodialysis treatment.  You take certain medicines for conditions, including cancer, organ transplantation, and autoimmune conditions. Hepatitis C  Blood testing is recommended for:  Everyone born from [birth year] through 1966.  Anyone with known risk factors for hepatitis C. Sexually transmitted infections (STIs)  You should be screened for sexually transmitted infections (STIs) including gonorrhea and chlamydia if:  You are sexually active and are younger than 41 years of age.  You are older than 41 years of age and your health care provider tells you that you are at risk for this type of infection.  Your sexual activity has changed since you were last screened and you are at an increased risk for chlamydia or gonorrhea. Ask your health care provider if you are at risk.  If you do not have HIV, but are at risk, it may be recommended that you take a prescription medicine daily to prevent HIV infection. This is called pre-exposure prophylaxis (PrEP). You are considered at risk if:  You are sexually active and do not regularly use condoms or know the HIV status of your partner(s).  You take drugs by injection.  You are sexually  active with a partner who has HIV. Talk with your health care provider about whether you are at high risk of being infected with HIV. If you choose to begin PrEP, you should first be tested for HIV. You should then be tested every 3 months for as long as you are taking PrEP.  PREGNANCY   If you are premenopausal and you may become pregnant, ask your health care provider about preconception counseling.  If you may  become pregnant, take 400 to 800 micrograms (mcg) of folic acid every day.  If you want to prevent pregnancy, talk to your health care provider about birth control (contraception). OSTEOPOROSIS AND MENOPAUSE   Osteoporosis is a disease in which the bones lose minerals and strength with aging. This can result in serious bone fractures. Your risk for osteoporosis can be identified using a bone density scan.  If you are 11 years of age or older, or if you are at risk for osteoporosis and fractures, ask your health care provider if you should be screened.  Ask your health care provider whether you should take a calcium or vitamin D supplement to lower your risk for osteoporosis.  Menopause may have certain physical symptoms and risks.  Hormone replacement therapy may reduce some of these symptoms and risks. Talk to your health care provider about whether hormone replacement therapy is right for you.  HOME CARE INSTRUCTIONS   Schedule regular health, dental, and eye exams.  Stay current with your immunizations.   Do not use any tobacco products including cigarettes, chewing tobacco, or electronic cigarettes.  If you are pregnant, do not drink alcohol.  If you are breastfeeding, limit how much and how often you drink alcohol.  Limit alcohol intake to no more than 1 drink per day for nonpregnant women. One drink equals 12 ounces of beer, 5 ounces of wine, or 1 ounces of hard liquor.  Do not use street drugs.  Do not share needles.  Ask your health care provider for help if  you need support or information about quitting drugs.  Tell your health care provider if you often feel depressed.  Tell your health care provider if you have ever been abused or do not feel safe at home.   This information is not intended to replace advice given to you by your health care provider. Make sure you discuss any questions you have with your health care provider.   Document Released: 11/11/2010 Document Revised: 05/19/2014 Document Reviewed: 03/30/2013 Elsevier Interactive Patient Education 2016 Elsevier Inc. Monilial Vaginitis Vaginitis in a soreness, swelling and redness (inflammation) of the vagina and vulva. Monilial vaginitis is not a sexually transmitted infection. CAUSES  Yeast vaginitis is caused by yeast (candida) that is normally found in your vagina. With a yeast infection, the candida has overgrown in number to a point that upsets the chemical balance. SYMPTOMS   White, thick vaginal discharge.  Swelling, itching, redness and irritation of the vagina and possibly the lips of the vagina (vulva).  Burning or painful urination.  Painful intercourse. DIAGNOSIS  Things that may contribute to monilial vaginitis are:  Postmenopausal and virginal states.  Pregnancy.  Infections.  Being tired, sick or stressed, especially if you had monilial vaginitis in the past.  Diabetes. Good control will help lower the chance.  Birth control pills.  Tight fitting garments.  Using bubble bath, feminine sprays, douches or deodorant tampons.  Taking certain medications that kill germs (antibiotics).  Sporadic recurrence can occur if you become ill. TREATMENT  Your caregiver will give you medication.  There are several kinds of anti monilial vaginal creams and suppositories specific for monilial vaginitis. For recurrent yeast infections, use a suppository or cream in the vagina 2 times a week, or as directed.  Anti-monilial or steroid cream for the itching or  irritation of the vulva may also be used. Get your caregiver's permission.  Painting the vagina with methylene blue solution may help if the monilial cream does  not work.  Eating yogurt may help prevent monilial vaginitis. HOME CARE INSTRUCTIONS   Finish all medication as prescribed.  Do not have sex until treatment is completed or after your caregiver tells you it is okay.  Take warm sitz baths.  Do not douche.  Do not use tampons, especially scented ones.  Wear cotton underwear.  Avoid tight pants and panty hose.  Tell your sexual partner that you have a yeast infection. They should go to their caregiver if they have symptoms such as mild rash or itching.  Your sexual partner should be treated as well if your infection is difficult to eliminate.  Practice safer sex. Use condoms.  Some vaginal medications cause latex condoms to fail. Vaginal medications that harm condoms are:  Cleocin cream.  Butoconazole (Femstat).  Terconazole (Terazol) vaginal suppository.  Miconazole (Monistat) (may be purchased over the counter). SEEK MEDICAL CARE IF:   You have a temperature by mouth above 102 F (38.9 C).  The infection is getting worse after 2 days of treatment.  The infection is not getting better after 3 days of treatment.  You develop blisters in or around your vagina.  You develop vaginal bleeding, and it is not your menstrual period.  You have pain when you urinate.  You develop intestinal problems.  You have pain with sexual intercourse.   This information is not intended to replace advice given to you by your health care provider. Make sure you discuss any questions you have with your health care provider.   Document Released: 02/05/2005 Document Revised: 07/21/2011 Document Reviewed: 10/30/2014 Elsevier Interactive Patient Education 2016 Elsevier Inc. Bacterial Vaginosis Bacterial vaginosis is a vaginal infection that occurs when the normal balance of  bacteria in the vagina is disrupted. It results from an overgrowth of certain bacteria. This is the most common vaginal infection in women of childbearing age. Treatment is important to prevent complications, especially in pregnant women, as it can cause a premature delivery. CAUSES  Bacterial vaginosis is caused by an increase in harmful bacteria that are normally present in smaller amounts in the vagina. Several different kinds of bacteria can cause bacterial vaginosis. However, the reason that the condition develops is not fully understood. RISK FACTORS Certain activities or behaviors can put you at an increased risk of developing bacterial vaginosis, including:  Having a new sex partner or multiple sex partners.  Douching.  Using an intrauterine device (IUD) for contraception. Women do not get bacterial vaginosis from toilet seats, bedding, swimming pools, or contact with objects around them. SIGNS AND SYMPTOMS  Some women with bacterial vaginosis have no signs or symptoms. Common symptoms include:  Grey vaginal discharge.  A fishlike odor with discharge, especially after sexual intercourse.  Itching or burning of the vagina and vulva.  Burning or pain with urination. DIAGNOSIS  Your health care provider will take a medical history and examine the vagina for signs of bacterial vaginosis. A sample of vaginal fluid may be taken. Your health care provider will look at this sample under a microscope to check for bacteria and abnormal cells. A vaginal pH test may also be done.  TREATMENT  Bacterial vaginosis may be treated with antibiotic medicines. These may be given in the form of a pill or a vaginal cream. A second round of antibiotics may be prescribed if the condition comes back after treatment. Because bacterial vaginosis increases your risk for sexually transmitted diseases, getting treated can help reduce your risk for chlamydia, gonorrhea, HIV, and herpes.  HOME CARE INSTRUCTIONS    Only take over-the-counter or prescription medicines as directed by your health care provider.  If antibiotic medicine was prescribed, take it as directed. Make sure you finish it even if you start to feel better.  Tell all sexual partners that you have a vaginal infection. They should see their health care provider and be treated if they have problems, such as a mild rash or itching.  During treatment, it is important that you follow these instructions:  Avoid sexual activity or use condoms correctly.  Do not douche.  Avoid alcohol as directed by your health care provider.  Avoid breastfeeding as directed by your health care provider. SEEK MEDICAL CARE IF:   Your symptoms are not improving after 3 days of treatment.  You have increased discharge or pain.  You have a fever. MAKE SURE YOU:   Understand these instructions.  Will watch your condition.  Will get help right away if you are not doing well or get worse. FOR MORE INFORMATION  Centers for Disease Control and Prevention, Division of STD Prevention: [employer] American Sexual Health Association (ASHA): www.ashastd.org    This information is not intended to replace advice given to you by your health care provider. Make sure you discuss any questions you have with your health care provider.   Document Released: 04/28/2005 Document Revised: 05/19/2014 Document Reviewed: 12/08/2012 Elsevier Interactive Patient Education [employer].

## 2015-06-04 ENCOUNTER — Telehealth: Payer: Self-pay

## 2015-06-04 NOTE — Telephone Encounter (Signed)
Error

## 2015-06-05 LAB — PAP IG, CT-NG NAA, HPV HIGH-RISK
Chlamydia Probe Amp: NOT DETECTED
GC Probe Amp: NOT DETECTED
HPV DNA High Risk: NOT DETECTED

## 2015-06-06 ENCOUNTER — Other Ambulatory Visit: Payer: Self-pay

## 2015-06-06 DIAGNOSIS — R1030 Lower abdominal pain, unspecified: Secondary | ICD-10-CM

## 2015-06-20 ENCOUNTER — Telehealth: Payer: Self-pay | Admitting: Family Medicine

## 2015-06-20 ENCOUNTER — Ambulatory Visit
Admission: RE | Admit: 2015-06-20 | Discharge: 2015-06-20 | Disposition: A | Discharge status: Home / Self Care | Payer: BLUE CROSS/BLUE SHIELD | Source: Ambulatory Visit | Attending: Emergency Medicine | Admitting: Emergency Medicine

## 2015-06-20 DIAGNOSIS — N898 Other specified noninflammatory disorders of vagina: Secondary | ICD-10-CM

## 2015-06-20 DIAGNOSIS — R1030 Lower abdominal pain, unspecified: Secondary | ICD-10-CM

## 2015-06-20 NOTE — Telephone Encounter (Signed)
Spoke with patient but she has had her CPE in January with Dr. Everlene Farrier so we went ahead and cancelled the appointment with Debbie on 07/04/15.

## 2015-07-04 ENCOUNTER — Encounter: Payer: Managed Care, Other (non HMO) | Admitting: Family Medicine

## 2015-07-05 ENCOUNTER — Other Ambulatory Visit: Payer: Self-pay | Admitting: Physician Assistant

## 2015-07-14 ENCOUNTER — Other Ambulatory Visit: Payer: Self-pay

## 2015-07-14 DIAGNOSIS — D259 Leiomyoma of uterus, unspecified: Secondary | ICD-10-CM

## 2015-07-25 ENCOUNTER — Ambulatory Visit (INDEPENDENT_AMBULATORY_CARE_PROVIDER_SITE_OTHER): Payer: BLUE CROSS/BLUE SHIELD | Admitting: Family Medicine

## 2015-07-25 VITALS — BP 120/68 | HR 98 | Temp 98.3°F | Resp 20 | Ht 66.0 in | Wt 134.6 lb

## 2015-07-25 DIAGNOSIS — J0100 Acute maxillary sinusitis, unspecified: Secondary | ICD-10-CM

## 2015-07-25 MED ORDER — LEVOFLOXACIN 500 MG PO TABS: 500.0000 mg | ORAL_TABLET | Freq: Every day | ORAL | Status: DC

## 2015-07-25 NOTE — Progress Notes (Signed)
Patient ID: Shirley Nunez, female   DOB: [birth year], 39 y.o.   MRN: [id number]  By signing my name below, I, Essence Howell, attest that this documentation has been prepared under the direction and in the presence of Robyn Haber, MD Electronically Signed: Ladene Artist, ED Scribe 07/25/2015 at 12:28 PM.  Patient ID: Shirley Nunez MRN: [id number], DOB: [birth year], 40 y.o. Date of Encounter: 07/25/2015, 12:16 PM  Primary Physician: No primary care provider on file.  Chief Complaint:  Chief Complaint  Patient presents with  . Sinusitis    hand , foot swelling   HPI: 40 y.o. year old female with history below presents with persistent sinus pressure for the past 2 weeks. Pt states that pain has worsened and now radiates into her jaw. She reports associated bilateral ear pain and nasal congestion for the past 2 weeks as well. Pt has also noticed swelling to her hands and feet for the past month. She reports similar swelling prior to diagnosis of HTN and starting Lisinopril-HCTZ. Allergy to Penicillins.   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   Past Medical History  Diagnosis Date  . Anxiety   . Hypertension   . Sickle cell anemia (HCC)     Sickle cell trait    Home Meds: Prior to Admission medications   Medication Sig Start Date End Date Taking? Authorizing Provider  acyclovir (ZOVIRAX) 200 MG capsule TAKE ONE CAPSULE BY MOUTH FIVE TIMES DAILY 07/07/15  Yes [name], PA-C  lisinopril-hydrochlorothiazide (PRINZIDE,ZESTORETIC) 20-25 MG tablet TAKE ONE TABLET BY MOUTH ONCE DAILY 06/03/15  Yes Darlyne Russian, MD  fluconazole (DIFLUCAN) 150 MG tablet Take 1 tablet (150 mg total) by mouth once. Repeat if needed Patient not taking: Reported on 07/25/2015 06/03/15   Darlyne Russian, MD  metroNIDAZOLE (FLAGYL) 500 MG tablet Take 1 tablet (500 mg total) by mouth 3 (three) times daily. Patient not taking: Reported on 07/25/2015 06/03/15   Darlyne Russian, MD   Allergies:  Allergies  Allergen  Reactions  . Penicillins Rash   Social History   Social History  . Marital Status: Married    Spouse Name: N/A  . Number of Children: N/A  . Years of Education: N/A   Occupational History  . Not on file.   Social History Main Topics  . Smoking status: Never Smoker   . Smokeless tobacco: Not on file  . Alcohol Use: 1.2 oz/week    0 Standard drinks or equivalent, 2 Glasses of wine per week  . Drug Use: No  . Sexual Activity: Not on file   Other Topics Concern  . Not on file   Social History Narrative    Review of Systems Constitutional: negative for chills, fever, night sweats, weight changes, or fatigue  HEENT: negative for vision changes, hearing loss, rhinorrhea, ST, epistaxis, +sinus pressure, +congestion, +ear pain Cardiovascular: negative for chest pain or palpitations Respiratory: negative for hemoptysis, wheezing, shortness of breath, or cough Abdominal: negative for abdominal pain, nausea, vomiting, diarrhea, or constipation Msk: +joint swelling Dermatological: negative for rash Neurologic: negative for headache, dizziness, or syncope All other systems reviewed and are otherwise negative with the exception to those above and in the HPI.  Physical Exam: Blood pressure 120/68, pulse 98, temperature 98.3 F (36.8 C), temperature source Oral, resp. rate 20, height 5\' 6"  (1.676 m), weight 134 lb 9.6 oz (61.054 kg), SpO2 96 %., Body mass index is 21.74 kg/(m^2). General: Well developed, well nourished, in no acute  distress. Head: Normocephalic, atraumatic, eyes without discharge, sclera non-icteric, Swollen nasal passages. Bilateral auditory canals clear, TM's are without perforation, pearly grey and translucent with reflective cone of light bilaterally. Oral cavity moist, posterior pharynx without exudate, erythema, peritonsillar abscess, or post nasal drip.  Neck: Supple. No thyromegaly. Full ROM. No lymphadenopathy. Lungs: Clear bilaterally to auscultation without  wheezes, rales, or rhonchi. Breathing is unlabored. Heart: RRR with S1 S2. No murmurs, rubs, or gallops appreciated. Abdomen: Soft, non-tender, non-distended with normoactive bowel sounds. No hepatomegaly. No rebound/guarding. No obvious abdominal masses. Msk:  Strength and tone normal for age.  Extremities/Skin: Warm and dry. No clubbing or cyanosis. Hands appear normal with no real loss of ROM.No edema or ankle. No rashes or suspicious lesions. Neuro: Alert and oriented X 3. Moves all extremities spontaneously. Gait is normal. CNII-XII grossly in tact. Psych:  Responds to questions appropriately with a normal affect.   ASSESSMENT AND PLAN:  40 y.o. year old female with  1. Acute maxillary sinusitis, recurrence not specified    This chart was scribed in my presence and reviewed by me personally.    ICD-9-CM ICD-10-CM   1. Acute maxillary sinusitis, recurrence not specified 461.0 J01.00 levofloxacin (LEVAQUIN) 500 MG tablet     Signed, Robyn Haber, MD 07/25/2015 12:16 PM

## 2015-07-25 NOTE — Patient Instructions (Addendum)

## 2015-09-21 ENCOUNTER — Other Ambulatory Visit: Payer: Self-pay | Admitting: Physician Assistant

## 2015-11-07 ENCOUNTER — Ambulatory Visit (INDEPENDENT_AMBULATORY_CARE_PROVIDER_SITE_OTHER): Payer: BLUE CROSS/BLUE SHIELD | Admitting: Family Medicine

## 2015-11-07 VITALS — BP 122/72 | HR 84 | Temp 98.6°F | Resp 17 | Ht 65.5 in | Wt 135.0 lb

## 2015-11-07 DIAGNOSIS — R609 Edema, unspecified: Secondary | ICD-10-CM | POA: Diagnosis not present

## 2015-11-07 DIAGNOSIS — R202 Paresthesia of skin: Secondary | ICD-10-CM

## 2015-11-07 DIAGNOSIS — D649 Anemia, unspecified: Secondary | ICD-10-CM

## 2015-11-07 DIAGNOSIS — R200 Anesthesia of skin: Secondary | ICD-10-CM

## 2015-11-07 LAB — COMPLETE METABOLIC PANEL WITH GFR
ALBUMIN: 4.4 g/dL (ref 3.6–5.1)
ALT: 12 U/L (ref 6–29)
AST: 14 U/L (ref 10–30)
Alkaline Phosphatase: 41 U/L (ref 33–115)
BILIRUBIN TOTAL: 0.5 mg/dL (ref 0.2–1.2)
BUN: 14 mg/dL (ref 7–25)
CALCIUM: 9.3 mg/dL (ref 8.6–10.2)
CO2: 29 mmol/L (ref 20–31)
CREATININE: 0.72 mg/dL (ref 0.50–1.10)
Chloride: 100 mmol/L (ref 98–110)
Glucose, Bld: 80 mg/dL (ref 65–99)
Potassium: 3.7 mmol/L (ref 3.5–5.3)
Sodium: 136 mmol/L (ref 135–146)
TOTAL PROTEIN: 7.6 g/dL (ref 6.1–8.1)

## 2015-11-07 LAB — IRON,TIBC AND FERRITIN PANEL
%SAT: 12 % (ref 11–50)
FERRITIN: 80 ng/mL (ref 10–232)
Iron: 34 ug/dL — ABNORMAL LOW (ref 40–190)
TIBC: 293 ug/dL (ref 250–450)

## 2015-11-07 LAB — VITAMIN B12: Vitamin B-12: 664 pg/mL (ref 200–1100)

## 2015-11-07 LAB — FOLATE: Folate: 8.8 ng/mL (ref >5.4)

## 2015-11-07 LAB — SEDIMENTATION RATE: SED RATE: 44 mm/h — AB (ref 0–20)

## 2015-11-07 NOTE — Patient Instructions (Addendum)
  Great to meet you!  We will call or send a letter with your labs within a week  Monitor your salt intake to control swelling.      IF you received an x-ray today, you will receive an invoice from [facility]. Please contact [facility] at [phone] with questions or concerns regarding your invoice.   IF you received labwork today, you will receive an invoice from [employer]. Please contact [facility] at [phone] with questions or concerns regarding your invoice.   Our billing staff will not be able to assist you with questions regarding bills from these companies.  You will be contacted with the lab results as soon as they are available. The fastest way to get your results is to activate your My Chart account. Instructions are located on the last page of this paperwork. If you have not heard from [place] regarding the results in 2 weeks, please contact this office.

## 2015-11-07 NOTE — Progress Notes (Signed)
   HPI  Patient presents today here with swelling of hands and feet, dizziness, and numbness and tingling of the hands and feet  Pt states she has had several month Hx of swelling in Hands and feet with associated numbness and tingling. She states that it is worse in the AM and improves after a few hours. She has cut back on salt and made dietary changes which has not really had any effect  She denies fever, chills, sweats, or symptoms of illness.   She has mild, momentary dizziness when first standing or bending over when she stands that resolves after 1-2 seconds. No room spinning or weakness. NO pre-syncope    PMH: Smoking status noted ROS: Per HPI  Objective: BP 122/72 mmHg  Pulse 84  Temp(Src) 98.6 F (37 C) (Oral)  Resp 17  Ht 5' 5.5" (1.664 m)  Wt 135 lb (61.236 kg)  BMI 22.12 kg/m2  SpO2 100% Gen: NAD, alert, cooperative with exam HEENT: NCAT CV: RRR, good S1/S2, no murmur Resp: CTABL, no wheezes, non-labored Ext: No edema, warm Neuro: Alert and oriented, No gross deficits  Assessment and plan:  # Numbness and tingling, swelling Suspect salt related mild peripheral edema, not appreciated on exam today With Am symps worse than later in the day I did consider rheumatic Disease so I have added ESR and ANA Discussed limiting salt, continue current BP meds, Including HCTZ Consider B12/folate or anemia (previousnormocytic anemia with sickle cell trait   # dizziness Like;ly related to normal fluid shifts and transient decrease in BP with standing  BP WNL, still some elevations sometimes Continue current dose, RTC as needed   Normocytic anemia Repeat labs, Folate, B12, iron  Orders Placed This Encounter  Procedures  . Folate  . Vitamin B12  . Iron, TIBC and Ferritin Panel  . CMP14+EGFR  . POCT CBC    Kenn File, MD 10:09 AM

## 2015-11-09 ENCOUNTER — Telehealth: Payer: Self-pay | Admitting: Family Medicine

## 2015-11-09 DIAGNOSIS — F331 Major depressive disorder, recurrent, moderate: Secondary | ICD-10-CM | POA: Insufficient documentation

## 2015-11-09 DIAGNOSIS — M2560 Stiffness of unspecified joint, not elsewhere classified: Secondary | ICD-10-CM

## 2015-11-09 LAB — ANA, IFA COMPREHENSIVE PANEL
ANA: POSITIVE — AB
DS DNA AB: 1 [IU]/mL
ENA SM Ab Ser-aCnc: <1
SCLERODERMA (SCL-70) (ENA) ANTIBODY, IGG: NEGATIVE
SM/RNP: <1
SSA (RO) (ENA) ANTIBODY, IGG: NEGATIVE
SSB (La) (ENA) Antibody, IgG: <1

## 2015-11-09 LAB — ANTI-NUCLEAR AB-TITER (ANA TITER)

## 2015-11-09 NOTE — Telephone Encounter (Signed)
Called to follow-up for labs. She has a positive sedimentation rate and ANA. The ANA was weakly positive.  On clarification she does have joint stiffness for about 1-2 hours in the morning. She also has muscle pains in the thighs and arms.  Consider rheumatoid arthritis, polymyositis.  Patient will return to the clinic for repeat labs.  Laroy Apple, MD  11/09/2015, 2:52 PM

## 2015-11-14 ENCOUNTER — Ambulatory Visit (INDEPENDENT_AMBULATORY_CARE_PROVIDER_SITE_OTHER): Payer: BLUE CROSS/BLUE SHIELD | Admitting: Family Medicine

## 2015-11-14 DIAGNOSIS — M2560 Stiffness of unspecified joint, not elsewhere classified: Secondary | ICD-10-CM

## 2015-11-15 ENCOUNTER — Telehealth: Payer: Self-pay | Admitting: Family Medicine

## 2015-11-15 DIAGNOSIS — M2560 Stiffness of unspecified joint, not elsewhere classified: Secondary | ICD-10-CM

## 2015-11-15 LAB — CBC WITH DIFFERENTIAL/PLATELET
BASOS: 1 %
Basophils Absolute: 0 10*3/uL (ref 0.0–0.2)
EOS (ABSOLUTE): 0.1 10*3/uL (ref 0.0–0.4)
EOS: 1 %
HEMATOCRIT: 37.8 % (ref 34.0–46.6)
HEMOGLOBIN: 12.2 g/dL (ref 11.1–15.9)
IMMATURE GRANS (ABS): 0 10*3/uL (ref 0.0–0.1)
Immature Granulocytes: 0 %
LYMPHS: 29 %
Lymphocytes Absolute: 1.8 10*3/uL (ref 0.7–3.1)
MCH: 32 pg (ref 26.6–33.0)
MCHC: 32.3 g/dL (ref 31.5–35.7)
MCV: 99 fL — AB (ref 79–97)
Monocytes Absolute: 0.4 10*3/uL (ref 0.1–0.9)
Monocytes: 7 %
NEUTROS ABS: 3.8 10*3/uL (ref 1.4–7.0)
Neutrophils: 62 %
Platelets: 365 10*3/uL (ref 150–379)
RBC: 3.81 x10E6/uL (ref 3.77–5.28)
RDW: 12.2 % — ABNORMAL LOW (ref 12.3–15.4)
WBC: 6.1 10*3/uL (ref 3.4–10.8)

## 2015-11-15 LAB — CK: Total CK: 58 U/L (ref 24–173)

## 2015-11-15 LAB — RHEUMATOID FACTOR

## 2015-11-15 NOTE — Progress Notes (Signed)
Lab only visit  Laroy Apple, MD [facility] 11/15/2015, 12:17 PM

## 2015-11-15 NOTE — Telephone Encounter (Signed)
Patient with nonspecific lab findings of elevated sedimentation rate and positive ANA.  She's had joint stiffness and arm and leg swelling for the last several months that last more than 2 hours of morning. Her rheumatoid factor, CK, anti-smooth muscle, scleroderma, and anti-Ro and anti-la were all negative.  Given findings and persistent symptoms I recommended referral to rheumatology.  Referrals been placed, patient will await phone call from clinic for scheduling.  Laroy Apple, MD [facility] 11/15/2015, 12:08 PM

## 2015-12-05 NOTE — Telephone Encounter (Signed)
This was sent to scheduling.

## 2016-03-19 ENCOUNTER — Other Ambulatory Visit: Payer: Self-pay | Admitting: Emergency Medicine

## 2016-05-23 ENCOUNTER — Other Ambulatory Visit: Payer: Self-pay | Admitting: Emergency Medicine

## 2016-06-25 DIAGNOSIS — Z9889 Other specified postprocedural states: Secondary | ICD-10-CM | POA: Diagnosis not present

## 2016-06-25 DIAGNOSIS — Z01419 Encounter for gynecological examination (general) (routine) without abnormal findings: Secondary | ICD-10-CM | POA: Diagnosis not present

## 2016-06-25 DIAGNOSIS — Z124 Encounter for screening for malignant neoplasm of cervix: Secondary | ICD-10-CM | POA: Diagnosis not present

## 2016-06-25 LAB — HM PAP SMEAR: HM Pap smear: NEGATIVE

## 2016-06-25 LAB — HM HIV SCREENING LAB: HM HIV Screening: NEGATIVE

## 2016-07-04 DIAGNOSIS — Z136 Encounter for screening for cardiovascular disorders: Secondary | ICD-10-CM | POA: Diagnosis not present

## 2016-07-04 DIAGNOSIS — Z713 Dietary counseling and surveillance: Secondary | ICD-10-CM | POA: Diagnosis not present

## 2016-07-07 DIAGNOSIS — J029 Acute pharyngitis, unspecified: Secondary | ICD-10-CM | POA: Diagnosis not present

## 2016-07-10 ENCOUNTER — Other Ambulatory Visit: Payer: Self-pay | Admitting: Emergency Medicine

## 2016-07-10 ENCOUNTER — Ambulatory Visit (INDEPENDENT_AMBULATORY_CARE_PROVIDER_SITE_OTHER): Payer: 59 | Admitting: Emergency Medicine

## 2016-07-10 VITALS — BP 130/66 | HR 89 | Temp 98.7°F | Resp 16 | Ht 65.5 in | Wt 142.4 lb

## 2016-07-10 DIAGNOSIS — I10 Essential (primary) hypertension: Secondary | ICD-10-CM

## 2016-07-10 MED ORDER — LISINOPRIL 20 MG PO TABS: 20.0000 mg | ORAL_TABLET | Freq: Every day | ORAL | 3 refills | Status: DC

## 2016-07-10 NOTE — Patient Instructions (Addendum)
   IF you received an x-ray today, you will receive an invoice from [facility]. Please contact [facility] at [phone] with questions or concerns regarding your invoice.   IF you received labwork today, you will receive an invoice from [facility]. Please contact [facility] at [phone] with questions or concerns regarding your invoice.   Our billing staff will not be able to assist you with questions regarding bills from these companies.  You will be contacted with the lab results as soon as they are available. The fastest way to get your results is to activate your My Chart account. Instructions are located on the last page of this paperwork. If you have not heard from us regarding the results in 2 weeks, please contact this office.      Hypertension Hypertension is another name for high blood pressure. High blood pressure forces your heart to work harder to pump blood. This can cause problems over time. There are two numbers in a blood pressure reading. There is a top number (systolic) over a bottom number (diastolic). It is best to have a blood pressure below 120/80. Healthy choices can help lower your blood pressure. You may need medicine to help lower your blood pressure if:  Your blood pressure cannot be lowered with healthy choices.  Your blood pressure is higher than 130/80. Follow these instructions at home: Eating and drinking   If directed, follow the DASH eating plan. This diet includes:  Filling half of your plate at each meal with fruits and vegetables.  Filling one quarter of your plate at each meal with whole grains. Whole grains include whole wheat pasta, brown rice, and whole grain bread.  Eating or drinking low-fat dairy products, such as skim milk or low-fat yogurt.  Filling one quarter of your plate at each meal with low-fat (lean) proteins. Low-fat proteins include fish, skinless chicken, eggs, beans, and tofu.  Avoiding fatty meat,  cured and processed meat, or chicken with skin.  Avoiding premade or processed food.  Eat less than 1,500 mg of salt (sodium) a day.  Limit alcohol use to no more than 1 drink a day for nonpregnant women and 2 drinks a day for men. One drink equals 12 oz of beer, 5 oz of wine, or 1 oz of hard liquor. Lifestyle   Work with your doctor to stay at a healthy weight or to lose weight. Ask your doctor what the best weight is for you.  Get at least 30 minutes of exercise that causes your heart to beat faster (aerobic exercise) most days of the week. This may include walking, swimming, or biking.  Get at least 30 minutes of exercise that strengthens your muscles (resistance exercise) at least 3 days a week. This may include lifting weights or pilates.  Do not use any products that contain nicotine or tobacco. This includes cigarettes and e-cigarettes. If you need help quitting, ask your doctor.  Check your blood pressure at home as told by your doctor.  Keep all follow-up visits as told by your doctor. This is important. Medicines   Take over-the-counter and prescription medicines only as told by your doctor. Follow directions carefully.  Do not skip doses of blood pressure medicine. The medicine does not work as well if you skip doses. Skipping doses also puts you at risk for problems.  Ask your doctor about side effects or reactions to medicines that you should watch for. Contact a doctor if:  You think you are having a   reaction to the medicine you are taking.  You have headaches that keep coming back (recurring).  You feel dizzy.  You have swelling in your ankles.  You have trouble with your vision. Get help right away if:  You get a very bad headache.  You start to feel confused.  You feel weak or numb.  You feel faint.  You get very bad pain in your:  Chest.  Belly (abdomen).  You throw up (vomit) more than once.  You have trouble  breathing. Summary  Hypertension is another name for high blood pressure.  Making healthy choices can help lower blood pressure. If your blood pressure cannot be controlled with healthy choices, you may need to take medicine. This information is not intended to replace advice given to you by your health care provider. Make sure you discuss any questions you have with your health care provider. Document Released: 10/15/2007 Document Revised: 03/26/2016 Document Reviewed: 03/26/2016 Elsevier Interactive Patient Education  2017 Elsevier Inc.  

## 2016-07-10 NOTE — Progress Notes (Signed)
Shirley Nunez 41 y.o.   Chief Complaint  Patient presents with  . Medication Refill    Lisinopril    HISTORY OF PRESENT ILLNESS: This is a 41 y.o. female here for HTN f/u. Wishes to decrease dose of medication in the hopes she can one day not have to take any. Asymptomatic; has no complaints.  HPI   Prior to Admission medications   Medication Sig Start Date End Date Taking? Authorizing Provider  lisinopril-hydrochlorothiazide (PRINZIDE,ZESTORETIC) 20-25 MG tablet TAKE ONE TABLET BY MOUTH ONCE DAILY 03/21/16  Yes Shirley Jeffery, PA-C  acyclovir (ZOVIRAX) 200 MG capsule TAKE 1 CAPSULE BY MOUTH 5 TIMES DAILY Patient not taking: Reported on 07/10/2016 09/24/15   Shirley Russian, MD    Allergies  Allergen Reactions  . Penicillins Rash    Patient Active Problem List   Diagnosis Date Noted  . Joint stiffness 11/09/2015  . Herpes simplex of female genitalia 08/16/2014  . Essential hypertension 07/12/2014  . Sickle cell trait ([place]) 07/12/2014    Past Medical History:  Diagnosis Date  . Anxiety   . Hypertension   . Sickle cell anemia (H[facility])    Sickle cell trait    No past surgical history on file.  Social History   Social History  . Marital status: Married    Spouse name: N/A  . Number of children: N/A  . Years of education: N/A   Occupational History  . Not on file.   Social History Main Topics  . Smoking status: Never Smoker  . Smokeless tobacco: Never Used  . Alcohol use 1.2 oz/week    2 Glasses of wine per week  . Drug use: No  . Sexual activity: Not on file   Other Topics Concern  . Not on file   Social History Narrative  . No narrative on file    Family History  Problem Relation Age of Onset  . Hypertension Mother   . Hypertension Father   . Hyperlipidemia Father   . Diabetes Father      Review of Systems  Constitutional: Negative for chills and fever.  HENT: Positive for sore throat (had recent throat infection/laryngitis).   Eyes: Negative  for blurred vision and double vision.  Respiratory: Negative for cough, shortness of breath and wheezing.   Cardiovascular: Negative for chest pain, palpitations and leg swelling.  Gastrointestinal: Negative for abdominal pain, diarrhea, nausea and vomiting.  Genitourinary: Negative for dysuria and hematuria.  Musculoskeletal: Negative for myalgias and neck pain.  Skin: Negative for rash.  Neurological: Negative for dizziness, sensory change, focal weakness and headaches.  Endo/Heme/Allergies: Does not bruise/bleed easily.  All other systems reviewed and are negative.  Vitals:   07/10/16 1610  BP: 130/66  Pulse: 89  Resp: 16  Temp: 98.7 F (37.1 C)     Physical Exam  Constitutional: She is oriented to person, place, and time. She appears well-developed and well-nourished.  HENT:  Head: Normocephalic and atraumatic.  Nose: Nose normal.  Mouth/Throat: Oropharynx is clear and moist. No oropharyngeal exudate.  Eyes: Conjunctivae and EOM are normal. Pupils are equal, round, and reactive to light.  Neck: Normal range of motion. Neck supple. No JVD present. No thyromegaly present.  Cardiovascular: Normal rate, regular rhythm and normal heart sounds.   Pulmonary/Chest: Effort normal and breath sounds normal.  Abdominal: Soft. Bowel sounds are normal. She exhibits no distension. There is no tenderness.  Musculoskeletal: Normal range of motion.  Lymphadenopathy:    She has no cervical adenopathy.  Neurological: She is alert and oriented to person, place, and time. No sensory deficit. She exhibits normal muscle tone. Coordination normal.  Skin: Skin is warm and dry. Capillary refill takes less than 2 seconds.  Psychiatric: She has a normal mood and affect. Her behavior is normal.  Vitals reviewed.    ASSESSMENT & PLAN: Pt wishes to get off medication and will like to try lower dose with the hopes she can taper down to no meds. Will try Lisinopril 20mg  to start with. Advised to monitor  BB several times a day. Shirley Nunez was seen today for medication refill.  Diagnoses and all orders for this visit:  Essential hypertension  Other orders -     lisinopril (PRINIVIL,ZESTRIL) 20 MG tablet; Take 1 tablet (20 mg total) by mouth daily.    Patient Instructions       IF you received an x-ray today, you will receive an invoice from [facility]. Please contact [facility] at [phone] with questions or concerns regarding your invoice.   IF you received labwork today, you will receive an invoice from [facility]. Please contact [facility] at [phone] with questions or concerns regarding your invoice.   Our billing staff will not be able to assist you with questions regarding bills from these companies.  You will be contacted with the lab results as soon as they are available. The fastest way to get your results is to activate your My Chart account. Instructions are located on the last page of this paperwork. If you have not heard from [place] regarding the results in 2 weeks, please contact this office.     Hypertension Hypertension is another name for high blood pressure. High blood pressure forces your heart to work harder to pump blood. This can cause problems over time. There are two numbers in a blood pressure reading. There is a top number (systolic) over a bottom number (diastolic). It is best to have a blood pressure below 120/80. Healthy choices can help lower your blood pressure. You may need medicine to help lower your blood pressure if:  Your blood pressure cannot be lowered with healthy choices.  Your blood pressure is higher than 130/80. Follow these instructions at home: Eating and drinking   If directed, follow the DASH eating plan. This diet includes:  Filling half of your plate at each meal with fruits and vegetables.  Filling one quarter of your plate at each meal with whole grains. Whole grains include whole wheat pasta, brown rice, and  whole grain bread.  Eating or drinking low-fat dairy products, such as skim milk or low-fat yogurt.  Filling one quarter of your plate at each meal with low-fat (lean) proteins. Low-fat proteins include fish, skinless chicken, eggs, beans, and tofu.  Avoiding fatty meat, cured and processed meat, or chicken with skin.  Avoiding premade or processed food.  Eat less than 1,500 mg of salt (sodium) a day.  Limit alcohol use to no more than 1 drink a day for nonpregnant women and 2 drinks a day for men. One drink equals 12 oz of beer, 5 oz of wine, or 1 oz of hard liquor. Lifestyle   Work with your doctor to stay at a healthy weight or to lose weight. Ask your doctor what the best weight is for you.  Get at least 30 minutes of exercise that causes your heart to beat faster (aerobic exercise) most days of the week. This may include walking, swimming, or biking.  Get at least 30 minutes  of exercise that strengthens your muscles (resistance exercise) at least 3 days a week. This may include lifting weights or pilates.  Do not use any products that contain nicotine or tobacco. This includes cigarettes and e-cigarettes. If you need help quitting, ask your doctor.  Check your blood pressure at home as told by your doctor.  Keep all follow-up visits as told by your doctor. This is important. Medicines   Take over-the-counter and prescription medicines only as told by your doctor. Follow directions carefully.  Do not skip doses of blood pressure medicine. The medicine does not work as well if you skip doses. Skipping doses also puts you at risk for problems.  Ask your doctor about side effects or reactions to medicines that you should watch for. Contact a doctor if:  You think you are having a reaction to the medicine you are taking.  You have headaches that keep coming back (recurring).  You feel dizzy.  You have swelling in your ankles.  You have trouble with your vision. Get help  right away if:  You get a very bad headache.  You start to feel confused.  You feel weak or numb.  You feel faint.  You get very bad pain in your:  Chest.  Belly (abdomen).  You throw up (vomit) more than once.  You have trouble breathing. Summary  Hypertension is another name for high blood pressure.  Making healthy choices can help lower blood pressure. If your blood pressure cannot be controlled with healthy choices, you may need to take medicine. This information is not intended to replace advice given to you by your health care provider. Make sure you discuss any questions you have with your health care provider. Document Released: 10/15/2007 Document Revised: 03/26/2016 Document Reviewed: 03/26/2016 Elsevier Interactive Patient Education  2017 Elsevier Inc.     Agustina Caroli, MD [facility]

## 2016-07-16 DIAGNOSIS — R102 Pelvic and perineal pain: Secondary | ICD-10-CM | POA: Diagnosis not present

## 2016-07-16 DIAGNOSIS — Z1231 Encounter for screening mammogram for malignant neoplasm of breast: Secondary | ICD-10-CM | POA: Diagnosis not present

## 2016-07-23 LAB — HM MAMMOGRAPHY

## 2017-01-12 DIAGNOSIS — J018 Other acute sinusitis: Secondary | ICD-10-CM | POA: Diagnosis not present

## 2017-01-12 DIAGNOSIS — N760 Acute vaginitis: Secondary | ICD-10-CM | POA: Diagnosis not present

## 2017-04-16 DIAGNOSIS — J019 Acute sinusitis, unspecified: Secondary | ICD-10-CM | POA: Diagnosis not present

## 2017-05-15 DIAGNOSIS — I10 Essential (primary) hypertension: Secondary | ICD-10-CM | POA: Diagnosis not present

## 2017-05-15 DIAGNOSIS — R002 Palpitations: Secondary | ICD-10-CM | POA: Diagnosis not present

## 2017-05-15 DIAGNOSIS — Z8249 Family history of ischemic heart disease and other diseases of the circulatory system: Secondary | ICD-10-CM | POA: Diagnosis not present

## 2017-05-28 ENCOUNTER — Ambulatory Visit (INDEPENDENT_AMBULATORY_CARE_PROVIDER_SITE_OTHER): Payer: 59 | Admitting: Psychology

## 2017-05-28 DIAGNOSIS — F331 Major depressive disorder, recurrent, moderate: Secondary | ICD-10-CM | POA: Diagnosis not present

## 2017-06-04 DIAGNOSIS — I2510 Atherosclerotic heart disease of native coronary artery without angina pectoris: Secondary | ICD-10-CM | POA: Diagnosis not present

## 2017-06-04 DIAGNOSIS — I10 Essential (primary) hypertension: Secondary | ICD-10-CM | POA: Diagnosis not present

## 2017-06-11 ENCOUNTER — Ambulatory Visit (INDEPENDENT_AMBULATORY_CARE_PROVIDER_SITE_OTHER): Payer: 59 | Admitting: Psychology

## 2017-06-11 DIAGNOSIS — F331 Major depressive disorder, recurrent, moderate: Secondary | ICD-10-CM

## 2017-06-20 ENCOUNTER — Other Ambulatory Visit: Payer: Self-pay | Admitting: Emergency Medicine

## 2017-06-25 ENCOUNTER — Ambulatory Visit (INDEPENDENT_AMBULATORY_CARE_PROVIDER_SITE_OTHER): Payer: 59 | Admitting: Psychology

## 2017-06-25 DIAGNOSIS — F331 Major depressive disorder, recurrent, moderate: Secondary | ICD-10-CM

## 2017-07-09 ENCOUNTER — Ambulatory Visit (INDEPENDENT_AMBULATORY_CARE_PROVIDER_SITE_OTHER): Payer: 59 | Admitting: Psychology

## 2017-07-09 DIAGNOSIS — F331 Major depressive disorder, recurrent, moderate: Secondary | ICD-10-CM

## 2017-07-16 ENCOUNTER — Encounter: Payer: Self-pay | Admitting: Physician Assistant

## 2017-07-16 ENCOUNTER — Ambulatory Visit (INDEPENDENT_AMBULATORY_CARE_PROVIDER_SITE_OTHER): Payer: 59 | Admitting: Physician Assistant

## 2017-07-16 VITALS — BP 118/78 | HR 86 | Temp 99.1°F | Ht 65.25 in | Wt 146.0 lb

## 2017-07-16 DIAGNOSIS — Z0001 Encounter for general adult medical examination with abnormal findings: Secondary | ICD-10-CM

## 2017-07-16 DIAGNOSIS — D573 Sickle-cell trait: Secondary | ICD-10-CM

## 2017-07-16 DIAGNOSIS — Z136 Encounter for screening for cardiovascular disorders: Secondary | ICD-10-CM

## 2017-07-16 DIAGNOSIS — I10 Essential (primary) hypertension: Secondary | ICD-10-CM | POA: Diagnosis not present

## 2017-07-16 DIAGNOSIS — G43909 Migraine, unspecified, not intractable, without status migrainosus: Secondary | ICD-10-CM | POA: Insufficient documentation

## 2017-07-16 DIAGNOSIS — Z1231 Encounter for screening mammogram for malignant neoplasm of breast: Secondary | ICD-10-CM

## 2017-07-16 DIAGNOSIS — A6009 Herpesviral infection of other urogenital tract: Secondary | ICD-10-CM | POA: Diagnosis not present

## 2017-07-16 DIAGNOSIS — Z0000 Encounter for general adult medical examination without abnormal findings: Secondary | ICD-10-CM

## 2017-07-16 DIAGNOSIS — Z13220 Encounter for screening for lipoid disorders: Secondary | ICD-10-CM | POA: Diagnosis not present

## 2017-07-16 LAB — CBC WITH DIFFERENTIAL/PLATELET
BASOS PCT: 1.1 % (ref 0.0–3.0)
Basophils Absolute: 0.1 10*3/uL (ref 0.0–0.1)
EOS ABS: 0.1 10*3/uL (ref 0.0–0.7)
Eosinophils Relative: 1.6 % (ref 0.0–5.0)
HEMATOCRIT: 35.7 % — AB (ref 36.0–46.0)
Hemoglobin: 11.7 g/dL — ABNORMAL LOW (ref 12.0–15.0)
Lymphocytes Relative: 22.2 % (ref 12.0–46.0)
Lymphs Abs: 1.5 10*3/uL (ref 0.7–4.0)
MCHC: 32.9 g/dL (ref 30.0–36.0)
MCV: 99.9 fl (ref 78.0–100.0)
MONO ABS: 0.6 10*3/uL (ref 0.1–1.0)
Monocytes Relative: 8.4 % (ref 3.0–12.0)
Neutro Abs: 4.6 10*3/uL (ref 1.4–7.7)
Neutrophils Relative %: 66.7 % (ref 43.0–77.0)
PLATELETS: 305 10*3/uL (ref 150.0–400.0)
RBC: 3.57 Mil/uL — ABNORMAL LOW (ref 3.87–5.11)
RDW: 12.1 % (ref 11.5–15.5)
WBC: 6.9 10*3/uL (ref 4.0–10.5)

## 2017-07-16 LAB — LIPID PANEL
CHOL/HDL RATIO: 2
CHOLESTEROL: 150 mg/dL (ref 0–200)
HDL: 64.5 mg/dL (ref >39.00)
LDL CALC: 75 mg/dL (ref 0–99)
NonHDL: 85.31
TRIGLYCERIDES: 54 mg/dL (ref 0.0–149.0)
VLDL: 10.8 mg/dL (ref 0.0–40.0)

## 2017-07-16 LAB — COMPREHENSIVE METABOLIC PANEL
ALBUMIN: 4.4 g/dL (ref 3.5–5.2)
ALT: 10 U/L (ref 0–35)
AST: 11 U/L (ref 0–37)
Alkaline Phosphatase: 50 U/L (ref 39–117)
BUN: 12 mg/dL (ref 6–23)
CALCIUM: 9.9 mg/dL (ref 8.4–10.5)
CHLORIDE: 105 meq/L (ref 96–112)
CO2: 27 mEq/L (ref 19–32)
CREATININE: 0.75 mg/dL (ref 0.40–1.20)
GFR: 108.49 mL/min (ref >60.00)
Glucose, Bld: 86 mg/dL (ref 70–99)
Potassium: 4 mEq/L (ref 3.5–5.1)
SODIUM: 140 meq/L (ref 135–145)
Total Bilirubin: 0.6 mg/dL (ref 0.2–1.2)
Total Protein: 7.7 g/dL (ref 6.0–8.3)

## 2017-07-16 MED ORDER — VALACYCLOVIR HCL 500 MG PO TABS: 500.0000 mg | ORAL_TABLET | Freq: Every day | ORAL | 1 refills | Status: DC

## 2017-07-16 MED ORDER — LISINOPRIL-HYDROCHLOROTHIAZIDE 10-12.5 MG PO TABS: 1.0000 | ORAL_TABLET | Freq: Every day | ORAL | 0 refills | Status: DC

## 2017-07-16 NOTE — Assessment & Plan Note (Signed)
Currently asymptomatic. Continue to monitor.

## 2017-07-16 NOTE — Assessment & Plan Note (Signed)
Relatively well controlled. Consider abortive medication if frequency increases or they are not well managed with caffeine.

## 2017-07-16 NOTE — Assessment & Plan Note (Signed)
Will decrease Lisinopril 20 mg to Lisinopril 10 mg and add HCTZ 12.5mg  to help with swelling. Follow-up in 2-4 weeks. Was told to keep bp log for [place] and follow-up sooner if uncontrolled or symptoms change.

## 2017-07-16 NOTE — Assessment & Plan Note (Signed)
Well controlled on daily valtrex. Continue.

## 2017-07-16 NOTE — Patient Instructions (Signed)
It was great to meet you!  Let's change your blood pressure medication from Lisinopril 20 mg to Lisinopril '10mg'$  + HCTZ 12.5 mg. I have sent this in to your pharmacy. Let's follow-up in 2-4 weeks to make sure that this is working well for you, sooner if your BP are consistently >140/90.  Health Maintenance, Female Adopting a healthy lifestyle and getting preventive care can go a long way to promote health and wellness. Talk with your health care provider about what schedule of regular examinations is right for you. This is a good chance for you to check in with your provider about disease prevention and staying healthy. In between checkups, there are plenty of things you can do on your own. Experts have done a lot of research about which lifestyle changes and preventive measures are most likely to keep you healthy. Ask your health care provider for more information. Weight and diet Eat a healthy diet  Be sure to include plenty of vegetables, fruits, low-fat dairy products, and lean protein.  Do not eat a lot of foods high in solid fats, added sugars, or salt.  Get regular exercise. This is one of the most important things you can do for your health. ? Most adults should exercise for at least 150 minutes each week. The exercise should increase your heart rate and make you sweat (moderate-intensity exercise). ? Most adults should also do strengthening exercises at least twice a week. This is in addition to the moderate-intensity exercise.  Maintain a healthy weight  Body mass index (BMI) is a measurement that can be used to identify possible weight problems. It estimates body fat based on height and weight. Your health care provider can help determine your BMI and help you achieve or maintain a healthy weight.  For females 35 years of age and older: ? A BMI below 18.5 is considered underweight. ? A BMI of 18.5 to 24.9 is normal. ? A BMI of 25 to 29.9 is considered overweight. ? A BMI of 30 and  above is considered obese.  Watch levels of cholesterol and blood lipids  You should start having your blood tested for lipids and cholesterol at 43 years of age, then have this test every 5 years.  You may need to have your cholesterol levels checked more often if: ? Your lipid or cholesterol levels are high. ? You are older than 43 years of age. ? You are at high risk for heart disease.  Cancer screening Lung Cancer  Lung cancer screening is recommended for adults 17-3 years old who are at high risk for lung cancer because of a history of smoking.  A yearly low-dose CT scan of the lungs is recommended for people who: ? Currently smoke. ? Have quit within the past 15 years. ? Have at least a 30-pack-year history of smoking. A pack year is smoking an average of one pack of cigarettes a day for 1 year.  Yearly screening should continue until it has been 15 years since you quit.  Yearly screening should stop if you develop a health problem that would prevent you from having lung cancer treatment.  Breast Cancer  Practice breast self-awareness. This means understanding how your breasts normally appear and feel.  It also means doing regular breast self-exams. Let your health care provider know about any changes, no matter how small.  If you are in your 20s or 30s, you should have a clinical breast exam (CBE) by a health care provider every 1-3  years as part of a regular health exam.  If you are 27 or older, have a CBE every year. Also consider having a breast X-ray (mammogram) every year.  If you have a family history of breast cancer, talk to your health care provider about genetic screening.  If you are at high risk for breast cancer, talk to your health care provider about having an MRI and a mammogram every year.  Breast cancer gene (BRCA) assessment is recommended for women who have family members with BRCA-related cancers. BRCA-related cancers  include: ? Breast. ? Ovarian. ? Tubal. ? Peritoneal cancers.  Results of the assessment will determine the need for genetic counseling and BRCA1 and BRCA2 testing.  Cervical Cancer Your health care provider may recommend that you be screened regularly for cancer of the pelvic organs (ovaries, uterus, and vagina). This screening involves a pelvic examination, including checking for microscopic changes to the surface of your cervix (Pap test). You may be encouraged to have this screening done every 3 years, beginning at age 57.  For women ages 42-65, health care providers may recommend pelvic exams and Pap testing every 3 years, or they may recommend the Pap and pelvic exam, combined with testing for human papilloma virus (HPV), every 5 years. Some types of HPV increase your risk of cervical cancer. Testing for HPV may also be done on women of any age with unclear Pap test results.  Other health care providers may not recommend any screening for nonpregnant women who are considered low risk for pelvic cancer and who do not have symptoms. Ask your health care provider if a screening pelvic exam is right for you.  If you have had past treatment for cervical cancer or a condition that could lead to cancer, you need Pap tests and screening for cancer for at least 20 years after your treatment. If Pap tests have been discontinued, your risk factors (such as having a new sexual partner) need to be reassessed to determine if screening should resume. Some women have medical problems that increase the chance of getting cervical cancer. In these cases, your health care provider may recommend more frequent screening and Pap tests.  Colorectal Cancer  This type of cancer can be detected and often prevented.  Routine colorectal cancer screening usually begins at 43 years of age and continues through 43 years of age.  Your health care provider may recommend screening at an earlier age if you have risk factors  for colon cancer.  Your health care provider may also recommend using home test kits to check for hidden blood in the stool.  A small camera at the end of a tube can be used to examine your colon directly (sigmoidoscopy or colonoscopy). This is done to check for the earliest forms of colorectal cancer.  Routine screening usually begins at age 81.  Direct examination of the colon should be repeated every 5-10 years through 43 years of age. However, you may need to be screened more often if early forms of precancerous polyps or small growths are found.  Skin Cancer  Check your skin from head to toe regularly.  Tell your health care provider about any new moles or changes in moles, especially if there is a change in a mole's shape or color.  Also tell your health care provider if you have a mole that is larger than the size of a pencil eraser.  Always use sunscreen. Apply sunscreen liberally and repeatedly throughout the day.  Protect yourself  by wearing long sleeves, pants, a wide-brimmed hat, and sunglasses whenever you are outside.  Heart disease, diabetes, and high blood pressure  High blood pressure causes heart disease and increases the risk of stroke. High blood pressure is more likely to develop in: ? People who have blood pressure in the high end of the normal range (130-139/85-89 mm Hg). ? People who are overweight or obese. ? People who are African American.  If you are 18-39 years of age, have your blood pressure checked every 3-5 years. If you are 40 years of age or older, have your blood pressure checked every year. You should have your blood pressure measured twice-once when you are at a hospital or clinic, and once when you are not at a hospital or clinic. Record the average of the two measurements. To check your blood pressure when you are not at a hospital or clinic, you can use: ? An automated blood pressure machine at a pharmacy. ? A home blood pressure monitor.  If  you are between 55 years and 79 years old, ask your health care provider if you should take aspirin to prevent strokes.  Have regular diabetes screenings. This involves taking a blood sample to check your fasting blood sugar level. ? If you are at a normal weight and have a low risk for diabetes, have this test once every three years after 43 years of age. ? If you are overweight and have a high risk for diabetes, consider being tested at a younger age or more often. Preventing infection Hepatitis B  If you have a higher risk for hepatitis B, you should be screened for this virus. You are considered at high risk for hepatitis B if: ? You were born in a country where hepatitis B is common. Ask your health care provider which countries are considered high risk. ? Your parents were born in a high-risk country, and you have not been immunized against hepatitis B (hepatitis B vaccine). ? You have HIV or AIDS. ? You use needles to inject street drugs. ? You live with someone who has hepatitis B. ? You have had sex with someone who has hepatitis B. ? You get hemodialysis treatment. ? You take certain medicines for conditions, including cancer, organ transplantation, and autoimmune conditions.  Hepatitis C  Blood testing is recommended for: ? Everyone born from 1945 through 1965. ? Anyone with known risk factors for hepatitis C.  Sexually transmitted infections (STIs)  You should be screened for sexually transmitted infections (STIs) including gonorrhea and chlamydia if: ? You are sexually active and are younger than 43 years of age. ? You are older than 43 years of age and your health care provider tells you that you are at risk for this type of infection. ? Your sexual activity has changed since you were last screened and you are at an increased risk for chlamydia or gonorrhea. Ask your health care provider if you are at risk.  If you do not have HIV, but are at risk, it may be recommended  that you take a prescription medicine daily to prevent HIV infection. This is called pre-exposure prophylaxis (PrEP). You are considered at risk if: ? You are sexually active and do not regularly use condoms or know the HIV status of your partner(s). ? You take drugs by injection. ? You are sexually active with a partner who has HIV.  Talk with your health care provider about whether you are at high risk of being infected   with HIV. If you choose to begin PrEP, you should first be tested for HIV. You should then be tested every 3 months for as long as you are taking PrEP. Pregnancy  If you are premenopausal and you may become pregnant, ask your health care provider about preconception counseling.  If you may become pregnant, take 400 to 800 micrograms (mcg) of folic acid every day.  If you want to prevent pregnancy, talk to your health care provider about birth control (contraception). Osteoporosis and menopause  Osteoporosis is a disease in which the bones lose minerals and strength with aging. This can result in serious bone fractures. Your risk for osteoporosis can be identified using a bone density scan.  If you are 65 years of age or older, or if you are at risk for osteoporosis and fractures, ask your health care provider if you should be screened.  Ask your health care provider whether you should take a calcium or vitamin D supplement to lower your risk for osteoporosis.  Menopause may have certain physical symptoms and risks.  Hormone replacement therapy may reduce some of these symptoms and risks. Talk to your health care provider about whether hormone replacement therapy is right for you. Follow these instructions at home:  Schedule regular health, dental, and eye exams.  Stay current with your immunizations.  Do not use any tobacco products including cigarettes, chewing tobacco, or electronic cigarettes.  If you are pregnant, do not drink alcohol.  If you are  breastfeeding, limit how much and how often you drink alcohol.  Limit alcohol intake to no more than 1 drink per day for nonpregnant women. One drink equals 12 ounces of beer, 5 ounces of wine, or 1 ounces of hard liquor.  Do not use street drugs.  Do not share needles.  Ask your health care provider for help if you need support or information about quitting drugs.  Tell your health care provider if you often feel depressed.  Tell your health care provider if you have ever been abused or do not feel safe at home. This information is not intended to replace advice given to you by your health care provider. Make sure you discuss any questions you have with your health care provider. Document Released: 11/11/2010 Document Revised: 10/04/2015 Document Reviewed: 01/30/2015 Elsevier Interactive Patient Education  2018 Elsevier Inc.  

## 2017-07-16 NOTE — Progress Notes (Signed)
Shirley Nunez is a 42 y.o. female here to Establish care  I acted as a Education administrator for [employer], PA-C Anselmo Pickler, LPN  History of Present Illness:   Chief Complaint  Patient presents with  . [place]  . Annual Exam   She is here for physical exam.  Acute Concerns: None  Chronic Issues: HTN -- 15-16 years ago she was diagnosed, was on beta-blocker; currently on lisinopril 20 mg (was on HCTZ combo med at one point but came off it); takes medication at night; feels like maybe she is getting a little bit of swelling in fingers; home readings are 120-130/85-90, no HA or blurred vision Sickle cell -- has the trait, has never had a crisis Depression --  Sees Trey Paula for depression q 2 weeks -- has been seeing for a couple of months; was on Zoloft a long time ago but didn't like the way it made her feel Migraines -- relatively well managed, does take caffeine when it comes on, occurring about once a week HSV -- 2016 had a labial outbreak, on daily medication of 500 mg valtrex  Health Maintenance: Immunizations -- up to date, declined Flu Colonoscopy -- N/A Mammogram -- 07/23/2016 BI-Rads 2 Benign PAP -- done 06/25/2016 NILM/ HPV Negative Bone Density -- N/A PSA -- N/A Diet -- trying to cut back on sweets, pushing water, lots of fish, brown rice, vegetables Caffeine intake -- limited Sleep habits -- 6-10 hours 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  Other providers/specialists: Dr. Tommi Rumps -- cardiology Trey Paula -- therapist [facility] -- saw briefly  Past Medical History:  Diagnosis Date  . Anxiety   . Depression   . HSV infection    on daily medication  . Migraine   . Sickle cell anemia (HCC)    Sickle cell trait     Social History   Socioeconomic History  . Marital status: Married    Spouse name: Not on file  . Number of children: Not on file  . Years of education: Not on file  . Highest education level: Not on file  Social Needs  . Financial resource strain: Not on file  . Food insecurity - worry: Not on file  . Food insecurity - inability: Not on file  . Transportation needs - medical: Not on file  . Transportation needs - non-medical: Not on file  Occupational History  . Not on file  Tobacco Use  . Smoking status: Never Smoker  . Smokeless tobacco: Never Used  Substance and Sexual Activity  . Alcohol use: Yes    Alcohol/week: 1.2 oz    Types: 2 Glasses of wine per week  . Drug use: No  . Sexual activity: Yes    Birth control/protection: Other-see comments    Comment: ablation  Other Topics Concern  . Not on file  Social  History Narrative   Loss adjuster, chartered at [employer]    Married   2 kids   Likes: writing and reading   One of her sons passed away 34 years ago    Past Surgical History:  Procedure Laterality Date  . WISDOM TOOTH EXTRACTION      Family History  Problem Relation Age of Onset  . Hypertension Mother   . Hypertension Father   . Hyperlipidemia Father   . Diabetes Father     Allergies  Allergen Reactions  . Mushroom Extract Complex Rash  . Penicillins Rash     Current Medications:   Current Outpatient Medications:  .  lisinopril (PRINIVIL,ZESTRIL) 20 MG tablet, TAKE 1 TABLET BY MOUTH ONCE DAILY, Disp: 30 tablet, Rfl: 0 .  valACYclovir (VALTREX) 500 MG tablet, Take 1 tablet (500 mg total) by mouth daily., Disp: 90 tablet, Rfl: 1 .   lisinopril-hydrochlorothiazide (PRINZIDE,ZESTORETIC) 10-12.5 MG tablet, Take 1 tablet by mouth daily., Disp: 90 tablet, Rfl: 0   Review of Systems:   Review of Systems  Constitutional: Negative.  Negative for chills, fever, malaise/fatigue and weight loss.  HENT: Negative.  Negative for hearing loss, sinus pain and sore throat.   Eyes: Negative.  Negative for blurred vision.  Respiratory: Negative for cough and shortness of breath.        Slight cough due to weather change  Cardiovascular: Negative.  Negative for chest pain, palpitations and leg swelling.  Gastrointestinal: Negative.  Negative for abdominal pain, constipation, diarrhea, heartburn, nausea and vomiting.  Genitourinary: Negative.  Negative for dysuria, frequency and urgency.  Musculoskeletal: Negative.  Negative for back pain, myalgias and neck pain.  Skin: Negative.  Negative for itching and rash.  Neurological: Positive for dizziness. Negative for tingling, seizures, loss of consciousness and headaches.  Endo/Heme/Allergies: Negative.  Negative for polydipsia.  Psychiatric/Behavioral: Positive for depression. Negative for substance abuse and suicidal ideas. The patient is not nervous/anxious.     Vitals:   Vitals:   07/16/17 0839  BP: 118/78  Pulse: 86  Temp: 99.1 F (37.3 C)  TempSrc: Oral  SpO2: 98%  Weight: 146 lb (66.2 kg)  Height: 5' 5.25" (1.657 m)     Body mass index is 24.11 kg/m.  Physical Exam:   Physical Exam  Constitutional: She appears well-developed and well-nourished. She is cooperative.  Non-toxic appearance. She does not have a sickly appearance. She does not appear ill. No distress.  HENT:  Head: Normocephalic and atraumatic.  Right Ear: Tympanic membrane, external ear and ear canal normal. Tympanic membrane is not erythematous, not retracted and not bulging.  Left Ear: Tympanic membrane, external ear and ear canal normal. Tympanic membrane is not erythematous, not retracted and not  bulging.  Eyes: Conjunctivae, EOM and lids are normal. Pupils are equal, round, and reactive to light.  Neck: Trachea normal and full passive range of motion without pain.  Cardiovascular: Normal rate, regular rhythm, S1 normal, S2 normal, normal heart sounds and intact distal pulses.  Pulmonary/Chest: Effort normal and breath sounds normal. No tachypnea. No respiratory distress. She has no decreased breath sounds. She has no wheezes. She has no rhonchi. She has no rales.  Abdominal: Soft. Normal appearance and bowel sounds are normal. There is no tenderness.  Musculoskeletal: Normal range of motion.  Lymphadenopathy:    She has no cervical adenopathy.  Neurological: She is alert. She has normal reflexes. No cranial nerve deficit or sensory deficit. GCS eye subscore is 4. GCS verbal subscore is 5. GCS motor  subscore is 6.  Skin: Skin is warm, dry and intact.  Psychiatric: She has a normal mood and affect. Her speech is normal and behavior is normal.  Nursing note and vitals reviewed.   Assessment and Plan:    Problem List Items Addressed This Visit      Cardiovascular and Mediastinum   Essential hypertension    Will decrease Lisinopril 20 mg to Lisinopril 10 mg and add HCTZ 12.5mg  to help with swelling. Follow-up in 2-4 weeks. Was told to keep bp log for [place] and follow-up sooner if uncontrolled or symptoms change.      Relevant Medications   lisinopril-hydrochlorothiazide (PRINZIDE,ZESTORETIC) 10-12.5 MG tablet   Other Relevant Orders   CBC with Differential/Platelet   Comprehensive metabolic panel   Lipid panel   Migraine    Relatively well controlled. Consider abortive medication if frequency increases or they are not well managed with caffeine.      Relevant Medications   lisinopril-hydrochlorothiazide (PRINZIDE,ZESTORETIC) 10-12.5 MG tablet     Genitourinary   Herpes simplex of female genitalia    Well controlled on daily valtrex. Continue.      Relevant Medications    valACYclovir (VALTREX) 500 MG tablet     Other   Sickle cell trait (HCC)    Currently asymptomatic. Continue to monitor.       Other Visit Diagnoses    Routine general medical examination at a health care facility    -  Primary   Breast cancer screening by mammogram       Relevant Orders   MM SCREENING BREAST TOMO BILATERAL   Encounter for lipid screening for cardiovascular disease       Relevant Orders   Lipid panel       . Reviewed expectations re: course of current medical issues. . Discussed self-management of symptoms. . Outlined signs and symptoms indicating need for more acute intervention. . Patient verbalized understanding and all questions were answered. . See orders for this visit as documented in the electronic medical record. . Patient received an After-Visit Summary.   CMA or LPN served as scribe during this visit. History, Physical, and Plan performed by medical provider. Documentation and orders reviewed and attested to.   Inda Coke, PA-C

## 2017-07-20 ENCOUNTER — Encounter: Payer: Self-pay | Admitting: Physician Assistant

## 2017-07-22 ENCOUNTER — Encounter: Payer: Self-pay | Admitting: *Deleted

## 2017-07-23 ENCOUNTER — Ambulatory Visit (INDEPENDENT_AMBULATORY_CARE_PROVIDER_SITE_OTHER): Payer: 59 | Admitting: Psychology

## 2017-07-23 DIAGNOSIS — F331 Major depressive disorder, recurrent, moderate: Secondary | ICD-10-CM | POA: Diagnosis not present

## 2017-07-29 ENCOUNTER — Other Ambulatory Visit: Payer: Self-pay | Admitting: Emergency Medicine

## 2017-08-06 ENCOUNTER — Ambulatory Visit (INDEPENDENT_AMBULATORY_CARE_PROVIDER_SITE_OTHER): Payer: 59 | Admitting: Psychology

## 2017-08-06 DIAGNOSIS — F331 Major depressive disorder, recurrent, moderate: Secondary | ICD-10-CM | POA: Diagnosis not present

## 2017-08-20 ENCOUNTER — Ambulatory Visit: Payer: 59 | Admitting: Physician Assistant

## 2017-08-20 ENCOUNTER — Ambulatory Visit (INDEPENDENT_AMBULATORY_CARE_PROVIDER_SITE_OTHER): Payer: 59 | Admitting: Psychology

## 2017-08-20 DIAGNOSIS — F331 Major depressive disorder, recurrent, moderate: Secondary | ICD-10-CM

## 2017-09-03 ENCOUNTER — Ambulatory Visit (INDEPENDENT_AMBULATORY_CARE_PROVIDER_SITE_OTHER): Payer: 59 | Admitting: Psychology

## 2017-09-03 DIAGNOSIS — F331 Major depressive disorder, recurrent, moderate: Secondary | ICD-10-CM | POA: Diagnosis not present

## 2017-09-17 ENCOUNTER — Ambulatory Visit (INDEPENDENT_AMBULATORY_CARE_PROVIDER_SITE_OTHER): Payer: 59 | Admitting: Psychology

## 2017-09-17 DIAGNOSIS — F331 Major depressive disorder, recurrent, moderate: Secondary | ICD-10-CM | POA: Diagnosis not present

## 2017-10-01 ENCOUNTER — Ambulatory Visit (INDEPENDENT_AMBULATORY_CARE_PROVIDER_SITE_OTHER): Payer: 59 | Admitting: Psychology

## 2017-10-01 DIAGNOSIS — F331 Major depressive disorder, recurrent, moderate: Secondary | ICD-10-CM

## 2017-10-15 ENCOUNTER — Ambulatory Visit (INDEPENDENT_AMBULATORY_CARE_PROVIDER_SITE_OTHER): Payer: 59 | Admitting: Psychology

## 2017-10-15 DIAGNOSIS — F331 Major depressive disorder, recurrent, moderate: Secondary | ICD-10-CM

## 2017-10-19 ENCOUNTER — Other Ambulatory Visit: Payer: Self-pay | Admitting: Physician Assistant

## 2017-10-23 ENCOUNTER — Other Ambulatory Visit: Payer: Self-pay | Admitting: Family Medicine

## 2017-10-23 NOTE — Telephone Encounter (Signed)
Rx request: Diflucan  Attempted to call patient for symptoms- left message.  LOV: 07/16/17  PCP: [place]: verified

## 2017-10-23 NOTE — Telephone Encounter (Signed)
See note

## 2017-10-23 NOTE — Telephone Encounter (Signed)
Copied from [place] [id number]. Topic: Quick Communication - Rx Refill/Question >> Oct 23, 2017 12:02 PM Oliver Pila B wrote: Medication: diflucan  Has the patient contacted their pharmacy? Yes.   (Agent: If no, request that the patient contact the pharmacy for the refill.) (Agent: If yes, when and what did the pharmacy advise?)  Preferred Pharmacy (with phone number or street name): [facility]  Agent: Please be advised that RX refills may take up to 3 business days. We ask that you follow-up with your pharmacy.

## 2017-10-28 NOTE — Telephone Encounter (Signed)
Left message on voicemail to call office. Pt requesting Diflucan need to know symptoms.

## 2017-10-30 NOTE — Telephone Encounter (Signed)
Tried to contact pt numerous times regarding request for Diflucan. Pt has never returned calls.

## 2017-11-05 ENCOUNTER — Ambulatory Visit: Payer: 59 | Admitting: Psychology

## 2017-11-09 ENCOUNTER — Ambulatory Visit (INDEPENDENT_AMBULATORY_CARE_PROVIDER_SITE_OTHER): Payer: 59 | Admitting: Psychology

## 2017-11-09 DIAGNOSIS — F331 Major depressive disorder, recurrent, moderate: Secondary | ICD-10-CM | POA: Diagnosis not present

## 2017-11-19 ENCOUNTER — Ambulatory Visit (INDEPENDENT_AMBULATORY_CARE_PROVIDER_SITE_OTHER): Payer: 59 | Admitting: Psychology

## 2017-11-19 DIAGNOSIS — F331 Major depressive disorder, recurrent, moderate: Secondary | ICD-10-CM | POA: Diagnosis not present

## 2017-12-03 ENCOUNTER — Ambulatory Visit (INDEPENDENT_AMBULATORY_CARE_PROVIDER_SITE_OTHER): Payer: 59 | Admitting: Psychology

## 2017-12-03 DIAGNOSIS — F331 Major depressive disorder, recurrent, moderate: Secondary | ICD-10-CM

## 2017-12-08 ENCOUNTER — Telehealth: Payer: Self-pay | Admitting: Physician Assistant

## 2017-12-08 NOTE — Telephone Encounter (Signed)
Please call pt and have her come in today or tomorrow with [facility]. Needs to be seen for swelling. Please schedule.

## 2017-12-08 NOTE — Telephone Encounter (Signed)
Needs appointment for labs, blood pressure check and further discussion.  Inda Coke PA-C

## 2017-12-08 NOTE — Telephone Encounter (Signed)
Left message on voicemail to call office.  

## 2017-12-08 NOTE — Telephone Encounter (Signed)
Copied from [place] [id number]. Topic: General - Other >> Dec 08, 2017 11:35 AM Keene Breath wrote: Reason for CRM: Patient called to speak to a nurse regarding her BP medication.  She states that she is having swelling in her hands and would like to see if her medication needs to be changed.  Please advise.  CB# [phone] or [phone].

## 2017-12-08 NOTE — Telephone Encounter (Signed)
Please see message and advise 

## 2017-12-08 NOTE — Telephone Encounter (Signed)
Spoke to the patient and because of financial reasons she is unable to schedule an appt for this week. She would like to keep her appt for 8/8. She wanted to know if she could change her medication back to the 20 mg. The 10 mg is not be working.

## 2017-12-10 ENCOUNTER — Ambulatory Visit: Payer: 59 | Admitting: Physician Assistant

## 2017-12-17 ENCOUNTER — Encounter: Payer: Self-pay | Admitting: Physician Assistant

## 2017-12-17 ENCOUNTER — Ambulatory Visit (INDEPENDENT_AMBULATORY_CARE_PROVIDER_SITE_OTHER): Payer: 59 | Admitting: Physician Assistant

## 2017-12-17 VITALS — BP 120/88 | HR 97 | Ht 65.25 in | Wt 146.2 lb

## 2017-12-17 DIAGNOSIS — I10 Essential (primary) hypertension: Secondary | ICD-10-CM

## 2017-12-17 DIAGNOSIS — G4700 Insomnia, unspecified: Secondary | ICD-10-CM | POA: Diagnosis not present

## 2017-12-17 DIAGNOSIS — F320 Major depressive disorder, single episode, mild: Secondary | ICD-10-CM | POA: Diagnosis not present

## 2017-12-17 LAB — CBC
HEMATOCRIT: 36.2 % (ref 36.0–46.0)
HEMOGLOBIN: 12.1 g/dL (ref 12.0–15.0)
MCHC: 33.5 g/dL (ref 30.0–36.0)
MCV: 98.6 fl (ref 78.0–100.0)
PLATELETS: 307 10*3/uL (ref 150.0–400.0)
RBC: 3.67 Mil/uL — AB (ref 3.87–5.11)
RDW: 11.9 % (ref 11.5–15.5)
WBC: 5.7 10*3/uL (ref 4.0–10.5)

## 2017-12-17 LAB — COMPREHENSIVE METABOLIC PANEL
ALBUMIN: 4.3 g/dL (ref 3.5–5.2)
ALK PHOS: 58 U/L (ref 39–117)
ALT: 15 U/L (ref 0–35)
AST: 16 U/L (ref 0–37)
BUN: 14 mg/dL (ref 6–23)
CALCIUM: 9.3 mg/dL (ref 8.4–10.5)
CO2: 32 mEq/L (ref 19–32)
Chloride: 100 mEq/L (ref 96–112)
Creatinine, Ser: 0.8 mg/dL (ref 0.40–1.20)
GFR: 100.5 mL/min (ref >60.00)
Glucose, Bld: 64 mg/dL — ABNORMAL LOW (ref 70–99)
POTASSIUM: 3.3 meq/L — AB (ref 3.5–5.1)
SODIUM: 138 meq/L (ref 135–145)
TOTAL PROTEIN: 8 g/dL (ref 6.0–8.3)
Total Bilirubin: 0.4 mg/dL (ref 0.2–1.2)

## 2017-12-17 LAB — TSH: TSH: 1 u[IU]/mL (ref 0.35–4.50)

## 2017-12-17 MED ORDER — TRAZODONE HCL 50 MG PO TABS: 50.0000 mg | ORAL_TABLET | Freq: Every day | ORAL | 1 refills | Status: DC

## 2017-12-17 MED ORDER — LISINOPRIL 10 MG PO TABS: 10.0000 mg | ORAL_TABLET | Freq: Every day | ORAL | 1 refills | Status: DC

## 2017-12-17 MED ORDER — HYDROCHLOROTHIAZIDE 25 MG PO TABS: 25.0000 mg | ORAL_TABLET | Freq: Every day | ORAL | 1 refills | Status: DC

## 2017-12-17 NOTE — Progress Notes (Signed)
Shirley Nunez is a 42 y.o. female is here to discuss:   History of Present Illness:   Chief Complaint  Patient presents with  . Follow-up    HTN - BP has been fluctuating, over the past couple of weeks she has noticed increased swelling. She has increased her water intake. She denies visual changes but does note occasional HA. Lisinopril was changed to Lisinopril-HCTZ at last visit, she seems to be tolerated well except for swelling. Swelling worse around the time menses should occur.    . difficulty falling alseep    trouble falling sleep which has worsened since June 2019. She has not trouble staying alseep. This has been going on for years.     HPI   Hypertension During last office visit in March 2019 --> Lisinopril was decreased to 10 mg and HCTZ 12.5 mg was added due to swelling. She was advised to follow-up in 2-4 weeks but did not. Currently taking lisinopril-hydrochlorothiazide 10-12.5 mg. At home blood pressure readings are: On average 140/90. Patient denies chest pain, SOB, blurred vision, dizziness, unusual headaches. Patient is compliant with medication. Denies excessive caffeine intake, stimulant usage, excessive alcohol intake, or increase in salt consumption.  She does endorse taking electrolyte replacements that she bought on [employer], we reviewed these together, each 1 contains 500 mg of sodium.  She was doing this for a few days, but stopped.  Despite the change we made, she was having persistent swelling in her hands and feet.  Insomnia Taking Tylenol PM on a regular basis, has for years. Has tried melatonin without success for several months. Has tried sleepy time tea and better sleep hygiene. No concerns for sleep apnea. Has had xanax in the past to help her sleep, which did help, however she is hesitant to go back on this.  Depression/Anxiety She continues to see Trey Paula in our office, she is currently going through a separation.  She has been considering going on  something for her depression and anxiety.  Several years ago she tried Zoloft, however it did not work for her because she was so annoyed with how much sweating it caused her. No SI/HI.  There are no preventive care reminders to display for this patient.  Past Medical History:  Diagnosis Date  . Anxiety   . Depression   . HSV infection    on daily medication  . Migraine   . Sickle cell anemia (HCC)    Sickle cell trait     Social History   Socioeconomic History  . Marital status: Married    Spouse name: Not on file  . Number of children: Not on file  . Years of education: Not on file  . Highest education level: Not on file  Occupational History  . Not on file  Social Needs  . Financial resource strain: Not on file  . Food insecurity:    Worry: Not on file    Inability: Not on file  . Transportation needs:    Medical: Not on file    Non-medical: Not on file  Tobacco Use  . Smoking status: Never Smoker  . Smokeless tobacco: Never Used  Substance and Sexual Activity  . Alcohol use: Yes    Alcohol/week: 2.0 standard drinks    Types: 2 Glasses of wine per week  . Drug use: No  . Sexual activity: Yes    Birth control/protection: Other-see comments    Comment: ablation  Lifestyle  . Physical activity:  Days per week: Not on file    Minutes per session: Not on file  . Stress: Not on file  Relationships  . Social connections:    Talks on phone: Not on file    Gets together: Not on file    Attends religious service: Not on file    Active member of club or organization: Not on file    Attends meetings of clubs or organizations: Not on file    Relationship status: Not on file  . Intimate partner violence:    Fear of current or ex partner: Not on file    Emotionally abused: Not on file    Physically abused: Not on file    Forced sexual activity: Not on file  Other Topics Concern  . Not on file  Social History Narrative   Loss adjuster, chartered at [employer]    Married    2 kids   Likes: writing and reading   One of her sons passed away 46 years ago    Past Surgical History:  Procedure Laterality Date  . WISDOM TOOTH EXTRACTION      Family History  Problem Relation Age of Onset  . Hypertension Mother   . Depression Mother   . Miscarriages / [place] Mother   . Hypertension Father   . Hyperlipidemia Father   . Diabetes Father   . Arthritis Father   . Stroke Father   . Sickle cell anemia Sister   . Alcohol abuse Maternal Grandmother   . Arthritis Maternal Grandmother   . Depression Maternal Grandmother   . Diabetes Maternal Grandmother   . Heart attack Maternal Grandmother   . Hypertension Maternal Grandmother   . Stroke Maternal Grandmother   . Heart attack Maternal Grandfather   . Lung cancer Paternal Grandmother        mets to brain  . Alcohol abuse Paternal Grandfather   . Heart attack Paternal Grandfather   . Hypertension Paternal Grandfather   . Stroke Paternal Grandfather     PMHx, SurgHx, SocialHx, FamHx, Medications, and Allergies were reviewed in the Visit Navigator and updated as appropriate.   Patient Active Problem List   Diagnosis Date Noted  . Migraine 07/16/2017  . Joint stiffness 11/09/2015  . Herpes simplex of female genitalia 08/16/2014  . Essential hypertension 07/12/2014  . Sickle cell trait ([facility]) 07/12/2014    Social History   Tobacco Use  . Smoking status: Never Smoker  . Smokeless tobacco: Never Used  Substance Use Topics  . Alcohol use: Yes    Alcohol/week: 2.0 standard drinks    Types: 2 Glasses of wine per week  . Drug use: No    Current Medications and Allergies:    Current Outpatient Medications:  .  valACYclovir (VALTREX) 500 MG tablet, Take 1 tablet (500 mg total) by mouth daily., Disp: 90 tablet, Rfl: 1 .  hydrochlorothiazide (HYDRODIURIL) 25 MG tablet, Take 1 tablet (25 mg total) by mouth daily., Disp: 30 tablet, Rfl: 1 .  lisinopril (PRINIVIL,ZESTRIL) 10 MG tablet, Take 1 tablet (10  mg total) by mouth daily., Disp: 30 tablet, Rfl: 1 .  traZODone (DESYREL) 50 MG tablet, Take 1 tablet (50 mg total) by mouth at bedtime., Disp: 30 tablet, Rfl: 1   Allergies  Allergen Reactions  . Mushroom Extract Complex Rash  . Penicillins Rash    Review of Systems   ROS  Negative unless otherwise specified per HPI.  Vitals:   Vitals:   12/17/17 0940  BP: 120/88  Pulse:  97  SpO2: 97%  Weight: 146 lb 3.2 oz (66.3 kg)  Height: 5' 5.25" (1.657 m)     Body mass index is 24.14 kg/m.   Physical Exam:    Physical Exam  Constitutional: She appears well-developed. She is cooperative.  Non-toxic appearance. She does not have a sickly appearance. She does not appear ill. No distress.  Cardiovascular: Normal rate, regular rhythm, S1 normal, S2 normal, normal heart sounds and normal pulses.  Pulses:      Dorsalis pedis pulses are 2+ on the right side, and 2+ on the left side.       Posterior tibial pulses are 2+ on the right side, and 2+ on the left side.  Trace edema to bilateral feet, no swelling noted in upper extremities including hands  Pulmonary/Chest: Effort normal and breath sounds normal.  Neurological: She is alert. GCS eye subscore is 4. GCS verbal subscore is 5. GCS motor subscore is 6.  Skin: Skin is warm, dry and intact.  Psychiatric: She has a normal mood and affect. Her speech is normal and behavior is normal.  Nursing note and vitals reviewed.    Assessment and Plan:    Concepcion was seen today for follow-up and difficulty falling alseep.  Diagnoses and all orders for this visit:  Essential hypertension Uncontrolled based on her home readings, however she is using a wrist cuff so unsure of accuracy of her readings.  Due to her continued swelling, we are going to maintain her lisinopril 10 mg and increase her hydrochlorothiazide to 25 mg.  Discussed need for adequate hydration to avoid dehydration and AKI.  Will check CMP, CBC and TSH for further evaluation.   Follow-up in 4 to 6 weeks, sooner if needed. -     CBC -     Comprehensive metabolic panel -     TSH  Insomnia, unspecified type Long discussion regarding this.  We are going to try trazodone 25 mg, may increase to 50 mg nightly.  Stop Tylenol PM.  We also discussed maybe trying hydroxyzine if trazodone is unsuccessful for her.  Depression, major, single episode, mild ([place]) We had a long discussion regarding this as well today.  She is going to continue to follow-up with Trey Paula.  We are going to start trazodone nightly.  She is agreeable to maybe starting an SSRI, has had Zoloft in the past but it did cause excessive sweating.  Other orders -     lisinopril (PRINIVIL,ZESTRIL) 10 MG tablet; Take 1 tablet (10 mg total) by mouth daily. -     hydrochlorothiazide (HYDRODIURIL) 25 MG tablet; Take 1 tablet (25 mg total) by mouth daily. -     traZODone (DESYREL) 50 MG tablet; Take 1 tablet (50 mg total) by mouth at bedtime.    . Reviewed expectations re: course of current medical issues. . Discussed self-management of symptoms. . Outlined signs and symptoms indicating need for more acute intervention. . Patient verbalized understanding and all questions were answered. . See orders for this visit as documented in the electronic medical record. . Patient received an After Visit Summary.   Inda Coke, PA-C [place], [place] 12/17/2017  Follow-up: No follow-ups on file.

## 2017-12-17 NOTE — Patient Instructions (Signed)
It was great to see you!  1. Stop the combination blood pressure medication you are on and the tylenol PM. 2. Start Lisinopril 10 mg daily 3. Start Hydrochlorothiazide 25 mg daily 4. Start Trazodone 25 mg (CUT TABLET IN HALF) nightly, may increase to 50 mg if needed for sleep  Let's follow-up in 4-6 weeks, sooner if you have concerns.  Take care,  Inda Coke PA-C

## 2017-12-18 ENCOUNTER — Other Ambulatory Visit: Payer: Self-pay | Admitting: Physician Assistant

## 2017-12-18 MED ORDER — TRIAMTERENE-HCTZ 37.5-25 MG PO CAPS: 1.0000 | ORAL_CAPSULE | Freq: Every day | ORAL | 1 refills | Status: DC

## 2017-12-24 ENCOUNTER — Ambulatory Visit (INDEPENDENT_AMBULATORY_CARE_PROVIDER_SITE_OTHER): Payer: 59 | Admitting: Psychology

## 2017-12-24 DIAGNOSIS — F331 Major depressive disorder, recurrent, moderate: Secondary | ICD-10-CM

## 2017-12-29 ENCOUNTER — Telehealth: Payer: Self-pay | Admitting: Physician Assistant

## 2017-12-29 NOTE — Telephone Encounter (Signed)
Called patient and informed her that Per Aldona Bar it was okay to stop taking those medications. She can just follow up with Sam as scheduled on Sept 5th and discuss other options. Patient did request Xanax due to her having 2 more panic attacks since she last seen [place]. I explained to her that for Xanax she would have to have a office visit with  [facility] before she could prescribe that. Patient verbalized understanding.

## 2017-12-29 NOTE — Telephone Encounter (Signed)
Copied from [place] [id number]. Topic: Quick Communication - See Telephone Encounter >> Dec 29, 2017  9:30 AM Burchel, Abbi R wrote: traZODone (DESYREL) 50 MG tablet  triamterene-hydrochlorothiazide (DYAZIDE) 37.5-25 MG capsule   Pt would like to let Inda Coke know that she is no longer taking these medications bc she exp side effects (nasuea, sweating, lightheadedness).  She is still taking the [place] and is doing well, please call pt back to discuss.   Pt:  [phone]

## 2017-12-29 NOTE — Telephone Encounter (Signed)
See note

## 2018-01-07 ENCOUNTER — Ambulatory Visit (INDEPENDENT_AMBULATORY_CARE_PROVIDER_SITE_OTHER): Payer: 59 | Admitting: Psychology

## 2018-01-07 DIAGNOSIS — F331 Major depressive disorder, recurrent, moderate: Secondary | ICD-10-CM

## 2018-01-14 ENCOUNTER — Encounter: Payer: Self-pay | Admitting: Physician Assistant

## 2018-01-14 ENCOUNTER — Ambulatory Visit (INDEPENDENT_AMBULATORY_CARE_PROVIDER_SITE_OTHER): Payer: 59 | Admitting: Physician Assistant

## 2018-01-14 VITALS — BP 120/84 | HR 90 | Temp 98.6°F | Ht 65.0 in | Wt 142.4 lb

## 2018-01-14 DIAGNOSIS — I10 Essential (primary) hypertension: Secondary | ICD-10-CM | POA: Diagnosis not present

## 2018-01-14 DIAGNOSIS — F320 Major depressive disorder, single episode, mild: Secondary | ICD-10-CM

## 2018-01-14 LAB — BASIC METABOLIC PANEL
BUN: 13 mg/dL (ref 6–23)
CO2: 34 mEq/L — ABNORMAL HIGH (ref 19–32)
Calcium: 9.7 mg/dL (ref 8.4–10.5)
Chloride: 100 mEq/L (ref 96–112)
Creatinine, Ser: 0.76 mg/dL (ref 0.40–1.20)
GFR: 106.59 mL/min (ref >60.00)
Glucose, Bld: 89 mg/dL (ref 70–99)
POTASSIUM: 3.4 meq/L — AB (ref 3.5–5.1)
SODIUM: 140 meq/L (ref 135–145)

## 2018-01-14 MED ORDER — ESCITALOPRAM OXALATE 10 MG PO TABS: 10.0000 mg | ORAL_TABLET | Freq: Every day | ORAL | 1 refills | Status: DC

## 2018-01-14 MED ORDER — ALPRAZOLAM 0.25 MG PO TABS: 0.2500 mg | ORAL_TABLET | Freq: Every evening | ORAL | 0 refills | Status: DC | PRN

## 2018-01-14 NOTE — Patient Instructions (Signed)
It was great to see you!  Start daily Lexapro. Xanax as needed.  We will contact you regarding your labs.  Let's follow-up in 6 weeks, sooner if you have concerns.  Take care,  Inda Coke PA-C

## 2018-01-14 NOTE — Progress Notes (Signed)
Shirley Nunez is a 42 y.o. female here for a follow up of a pre-existing problem.   History of Present Illness:   Chief Complaint  Patient presents with  . Follow-up    on meds     HPI  3 or 4 days after taking her new medications, Trazadone and Triamterene-HCTZ she had one episode of vomiting, so she stopped both of them and resumed her HCTZ.  Currently taking HCTZ 50 mg. At home blood pressure readings are: 118/80. Patient denies chest pain, SOB, blurred vision, dizziness, unusual headaches, lower leg swelling. Patient is compliant with medication. Denies excessive caffeine intake, stimulant usage, excessive alcohol intake, or increase in salt consumption.  Down to 1 cup of coffee daily.  Has been on Xanax in the past and did well with this for insomnia, only took prn. She is also considering starting an SSRI. Zoloft caused sweating in the past, so she is resistant to trying this again. Denies SI/HI. Going through a divorce.    Past Medical History:  Diagnosis Date  . Anxiety   . Depression   . HSV infection    on daily medication  . Migraine   . Sickle cell anemia (HCC)    Sickle cell trait     Social History   Socioeconomic History  . Marital status: Married    Spouse name: Not on file  . Number of children: Not on file  . Years of education: Not on file  . Highest education level: Not on file  Occupational History  . Not on file  Social Needs  . Financial resource strain: Not on file  . Food insecurity:    Worry: Not on file    Inability: Not on file  . Transportation needs:    Medical: Not on file    Non-medical: Not on file  Tobacco Use  . Smoking status: Never Smoker  . Smokeless tobacco: Never Used  Substance and Sexual Activity  . Alcohol use: Yes    Alcohol/week: 2.0 standard drinks    Types: 2 Glasses of wine per week  . Drug use: No  . Sexual activity: Yes    Birth control/protection: Other-see comments    Comment: ablation  Lifestyle  .  Physical activity:    Days per week: Not on file    Minutes per session: Not on file  . Stress: Not on file  Relationships  . Social connections:    Talks on phone: Not on file    Gets together: Not on file    Attends religious service: Not on file    Active member of club or organization: Not on file    Attends meetings of clubs or organizations: Not on file    Relationship status: Not on file  . Intimate partner violence:    Fear of current or ex partner: Not on file    Emotionally abused: Not on file    Physically abused: Not on file    Forced sexual activity: Not on file  Other Topics Concern  . Not on file  Social History Narrative   Loss adjuster, chartered at [employer]    Married   2 kids   Likes: writing and reading   One of her sons passed away 91 years ago    Past Surgical History:  Procedure Laterality Date  . WISDOM TOOTH EXTRACTION      Family History  Problem Relation Age of Onset  . Hypertension Mother   . Depression Mother   .  Miscarriages / [place] Mother   . Hypertension Father   . Hyperlipidemia Father   . Diabetes Father   . Arthritis Father   . Stroke Father   . Sickle cell anemia Sister   . Alcohol abuse Maternal Grandmother   . Arthritis Maternal Grandmother   . Depression Maternal Grandmother   . Diabetes Maternal Grandmother   . Heart attack Maternal Grandmother   . Hypertension Maternal Grandmother   . Stroke Maternal Grandmother   . Heart attack Maternal Grandfather   . Lung cancer Paternal Grandmother        mets to brain  . Alcohol abuse Paternal Grandfather   . Heart attack Paternal Grandfather   . Hypertension Paternal Grandfather   . Stroke Paternal Grandfather     Allergies  Allergen Reactions  . Mushroom Extract Complex Rash  . Penicillins Rash    Current Medications:   Current Outpatient Medications:  .  hydrochlorothiazide (HYDRODIURIL) 25 MG tablet, Take 1 tablet (25 mg total) by mouth daily., Disp: 30 tablet, Rfl:  1 .  valACYclovir (VALTREX) 500 MG tablet, Take 1 tablet (500 mg total) by mouth daily., Disp: 90 tablet, Rfl: 1 .  ALPRAZolam (XANAX) 0.25 MG tablet, Take 1 tablet (0.25 mg total) by mouth at bedtime as needed for anxiety or sleep., Disp: 20 tablet, Rfl: 0 .  escitalopram (LEXAPRO) 10 MG tablet, Take 1 tablet (10 mg total) by mouth daily., Disp: 30 tablet, Rfl: 1   Review of Systems:   ROS  Negative unless otherwise specified per HPI.  Vitals:   Vitals:   01/14/18 1130  BP: 120/84  Pulse: 90  Temp: 98.6 F (37 C)  TempSrc: Oral  SpO2: 97%  Weight: 142 lb 6.4 oz (64.6 kg)  Height: 5\' 5"  (1.651 m)     Body mass index is 23.7 kg/m.  Physical Exam:   Physical Exam  Constitutional: She appears well-developed. She is cooperative.  Non-toxic appearance. She does not have a sickly appearance. She does not appear ill. No distress.  Cardiovascular: Normal rate, regular rhythm, S1 normal, S2 normal, normal heart sounds and normal pulses.  No LE edema  Pulmonary/Chest: Effort normal and breath sounds normal.  Neurological: She is alert. GCS eye subscore is 4. GCS verbal subscore is 5. GCS motor subscore is 6.  Skin: Skin is warm, dry and intact.  Psychiatric: She has a normal mood and affect. Her speech is normal and behavior is normal.  Nursing note and vitals reviewed.   Assessment and Plan:    Naz was seen today for follow-up.  Diagnoses and all orders for this visit:  Essential hypertension Well controlled. Continue HCTZ 25 mg daily. Re-check potassium today, was low at last visit, 3.3. -     Basic metabolic panel  Depression, major, single episode, mild ([place]) Long discussion today regarding this. No SI/HI. Continues to see Trey Paula. Discussed starting Lexapro 10 mg daily and xanax prn. I did discuss with her that if she feels like she needs xanax more frequently or Lexapro doesn't work well for her, psychiatry may be necessary.   Other orders -     ALPRAZolam  (XANAX) 0.25 MG tablet; Take 1 tablet (0.25 mg total) by mouth at bedtime as needed for anxiety or sleep. -     escitalopram (LEXAPRO) 10 MG tablet; Take 1 tablet (10 mg total) by mouth daily.    . Reviewed expectations re: course of current medical issues. . Discussed self-management of symptoms. . Outlined signs  and symptoms indicating need for more acute intervention. . Patient verbalized understanding and all questions were answered. . See orders for this visit as documented in the electronic medical record. . Patient received an After-Visit Summary.    Inda Coke, PA-C

## 2018-01-21 ENCOUNTER — Ambulatory Visit (INDEPENDENT_AMBULATORY_CARE_PROVIDER_SITE_OTHER): Payer: 59 | Admitting: Psychology

## 2018-01-21 DIAGNOSIS — F331 Major depressive disorder, recurrent, moderate: Secondary | ICD-10-CM | POA: Diagnosis not present

## 2018-01-31 ENCOUNTER — Other Ambulatory Visit: Payer: Self-pay | Admitting: Physician Assistant

## 2018-02-11 ENCOUNTER — Ambulatory Visit (INDEPENDENT_AMBULATORY_CARE_PROVIDER_SITE_OTHER): Payer: 59 | Admitting: Psychology

## 2018-02-11 DIAGNOSIS — F331 Major depressive disorder, recurrent, moderate: Secondary | ICD-10-CM

## 2018-02-25 ENCOUNTER — Ambulatory Visit (INDEPENDENT_AMBULATORY_CARE_PROVIDER_SITE_OTHER): Payer: 59 | Admitting: Physician Assistant

## 2018-02-25 ENCOUNTER — Encounter: Payer: Self-pay | Admitting: Physician Assistant

## 2018-02-25 VITALS — BP 126/88 | HR 87 | Temp 98.6°F | Ht 65.0 in | Wt 138.5 lb

## 2018-02-25 DIAGNOSIS — G4700 Insomnia, unspecified: Secondary | ICD-10-CM

## 2018-02-25 DIAGNOSIS — F320 Major depressive disorder, single episode, mild: Secondary | ICD-10-CM

## 2018-02-25 MED ORDER — DIAZEPAM 5 MG PO TABS: 5.0000 mg | ORAL_TABLET | Freq: Every evening | ORAL | 0 refills | Status: DC | PRN

## 2018-02-25 MED ORDER — ESCITALOPRAM OXALATE 10 MG PO TABS: 10.0000 mg | ORAL_TABLET | Freq: Every day | ORAL | 1 refills | Status: DC

## 2018-02-25 NOTE — Progress Notes (Signed)
Shirley Nunez is a 42 y.o. female is here to discuss: Depression  I acted as a Education administrator for [employer], PA-C Anselmo Pickler, LPN  History of Present Illness:   Chief Complaint  Patient presents with  . Depression    Depression         This is a chronic problem.  The problem occurs every several days.  The most recent episode lasted 2 hours.    The problem has been gradually improving since onset.  Associated symptoms include fatigue, insomnia, restlessness, appetite change (eating once a day), headaches and sad.  Associated symptoms include no decreased concentration, no helplessness, no hopelessness, not irritable, no decreased interest, no body aches, no indigestion and no suicidal ideas.     The symptoms are aggravated by family issues and work stress.  Compliance with treatment is good.  Previous treatment provided moderate relief.  Tried xanax a few times but did not help with her insomnia. When she started Lexapro it did allow her to sleep in for a few more hours (until 4a vs her usual 2a awakenings) but that has worn off. Feels overall much better on medication, insomnia is only issue she is still struggling with. Does have plans to see hypnotherapist in a week.  There are no preventive care reminders to display for this patient.  Past Medical History:  Diagnosis Date  . Anxiety   . Depression   . HSV infection    on daily medication  . Migraine   . Sickle cell anemia (HCC)    Sickle cell trait     Social History   Socioeconomic History  . Marital status: Married    Spouse name: Not on file  . Number of children: Not on file  . Years of education: Not on file  . Highest education level: Not on file  Occupational History  . Not on file  Social Needs  . Financial resource strain: Not on file  . Food insecurity:    Worry: Not on file    Inability: Not on file  . Transportation needs:    Medical: Not on file    Non-medical: Not on file  Tobacco Use  .  Smoking status: Never Smoker  . Smokeless tobacco: Never Used  Substance and Sexual Activity  . Alcohol use: Yes    Alcohol/week: 2.0 standard drinks    Types: 2 Glasses of wine per week  . Drug use: No  . Sexual activity: Yes    Birth control/protection: Other-see comments    Comment: ablation  Lifestyle  . Physical activity:    Days per week: Not on file    Minutes per session: Not on file  . Stress: Not on file  Relationships  . Social connections:    Talks on phone: Not on file    Gets together: Not on file    Attends religious service: Not on file    Active member of club or organization: Not on file    Attends meetings of clubs or organizations: Not on file    Relationship status: Not on file  . Intimate partner violence:    Fear of current or ex partner: Not on file    Emotionally abused: Not on file    Physically abused: Not on file    Forced sexual activity: Not on file  Other Topics Concern  . Not on file  Social History Narrative   Loss adjuster, chartered at [employer]    Married   2  kids   Likes: writing and reading   One of her sons passed away 82 years ago    Past Surgical History:  Procedure Laterality Date  . WISDOM TOOTH EXTRACTION      Family History  Problem Relation Age of Onset  . Hypertension Mother   . Depression Mother   . Miscarriages / [place] Mother   . Hypertension Father   . Hyperlipidemia Father   . Diabetes Father   . Arthritis Father   . Stroke Father   . Sickle cell anemia Sister   . Alcohol abuse Maternal Grandmother   . Arthritis Maternal Grandmother   . Depression Maternal Grandmother   . Diabetes Maternal Grandmother   . Heart attack Maternal Grandmother   . Hypertension Maternal Grandmother   . Stroke Maternal Grandmother   . Heart attack Maternal Grandfather   . Lung cancer Paternal Grandmother        mets to brain  . Alcohol abuse Paternal Grandfather   . Heart attack Paternal Grandfather   . Hypertension Paternal  Grandfather   . Stroke Paternal Grandfather     PMHx, SurgHx, SocialHx, FamHx, Medications, and Allergies were reviewed in the Visit Navigator and updated as appropriate.   Patient Active Problem List   Diagnosis Date Noted  . Migraine 07/16/2017  . Joint stiffness 11/09/2015  . Herpes simplex of female genitalia 08/16/2014  . Essential hypertension 07/12/2014  . Sickle cell trait ([place]) 07/12/2014    Social History   Tobacco Use  . Smoking status: Never Smoker  . Smokeless tobacco: Never Used  Substance Use Topics  . Alcohol use: Yes    Alcohol/week: 2.0 standard drinks    Types: 2 Glasses of wine per week  . Drug use: No    Current Medications and Allergies:    Current Outpatient Medications:  .  ALPRAZolam (XANAX) 0.25 MG tablet, Take 1 tablet (0.25 mg total) by mouth at bedtime as needed for anxiety or sleep., Disp: 20 tablet, Rfl: 0 .  escitalopram (LEXAPRO) 10 MG tablet, Take 1 tablet (10 mg total) by mouth daily., Disp: 90 tablet, Rfl: 1 .  hydrochlorothiazide (HYDRODIURIL) 25 MG tablet, Take 1 tablet (25 mg total) by mouth daily., Disp: 30 tablet, Rfl: 1 .  valACYclovir (VALTREX) 500 MG tablet, TAKE 1 TABLET BY MOUTH ONCE DAILY, Disp: 90 tablet, Rfl: 1 .  diazepam (VALIUM) 5 MG tablet, Take 1 tablet (5 mg total) by mouth at bedtime as needed (insomnia)., Disp: 30 tablet, Rfl: 0   Allergies  Allergen Reactions  . Mushroom Extract Complex Rash  . Penicillins Rash    Review of Systems   Review of Systems  Constitutional: Positive for appetite change (eating once a day) and fatigue.  Neurological: Positive for headaches.  Psychiatric/Behavioral: Positive for depression. Negative for decreased concentration and suicidal ideas. The patient has insomnia.     Vitals:   Vitals:   02/25/18 0937  BP: 126/88  Pulse: 87  Temp: 98.6 F (37 C)  TempSrc: Oral  SpO2: 98%  Weight: 138 lb 8 oz (62.8 kg)  Height: 5\' 5"  (1.651 m)     Body mass index is 23.05  kg/m.   Physical Exam:    Physical Exam  Constitutional: She appears well-developed. She is not irritable and cooperative.  Non-toxic appearance. She does not have a sickly appearance. She does not appear ill. No distress.  Cardiovascular: Normal rate, regular rhythm, S1 normal, S2 normal, normal heart sounds and normal pulses.  No LE  edema  Pulmonary/Chest: Effort normal and breath sounds normal.  Neurological: She is alert. GCS eye subscore is 4. GCS verbal subscore is 5. GCS motor subscore is 6.  Skin: Skin is warm, dry and intact.  Psychiatric: She has a normal mood and affect. Her speech is normal and behavior is normal.  Nursing note and vitals reviewed.    Assessment and Plan:    Nissi was seen today for depression.  Diagnoses and all orders for this visit:  Depression, major, single episode, mild ([place]) Improved on treatment. Continue current regimen on Lexapro 10 mg daily. Follow-up in 3-6 months, sooner if needed. Continue counseling with Trey Paula.  Insomnia, unspecified type Not controlled. We will try 5 mg valium. Follow-up if this is not effective. She declines sleep study. We are also consider increasing Lexapro to 20 mg to see if this will help, consider at follow-up.  Other orders -     diazepam (VALIUM) 5 MG tablet; Take 1 tablet (5 mg total) by mouth at bedtime as needed (insomnia). -     escitalopram (LEXAPRO) 10 MG tablet; Take 1 tablet (10 mg total) by mouth daily.  . Reviewed expectations re: course of current medical issues. . Discussed self-management of symptoms. . Outlined signs and symptoms indicating need for more acute intervention. . Patient verbalized understanding and all questions were answered. . See orders for this visit as documented in the electronic medical record. . Patient received an After Visit Summary.  CMA or LPN served as scribe during this visit. History, Physical, and Plan performed by medical provider. The above documentation  has been reviewed and is accurate and complete.  Inda Coke, PA-C [place] [place] 02/25/2018  Follow-up: No follow-ups on file.

## 2018-02-25 NOTE — Patient Instructions (Signed)
It was great to see you!  Continue Lexapro 10 mg.  May take valium for sleep, keep [place] posted on how it works for you.  Let's follow-up in 3-6 months, sooner if you have concerns.  Take care,  Inda Coke PA-C

## 2018-03-03 ENCOUNTER — Other Ambulatory Visit: Payer: Self-pay | Admitting: Physician Assistant

## 2018-03-04 ENCOUNTER — Other Ambulatory Visit: Payer: Self-pay

## 2018-03-04 ENCOUNTER — Ambulatory Visit (INDEPENDENT_AMBULATORY_CARE_PROVIDER_SITE_OTHER): Payer: 59 | Admitting: Psychology

## 2018-03-04 DIAGNOSIS — F331 Major depressive disorder, recurrent, moderate: Secondary | ICD-10-CM

## 2018-03-08 ENCOUNTER — Other Ambulatory Visit: Payer: Self-pay

## 2018-03-08 MED ORDER — HYDROCHLOROTHIAZIDE 25 MG PO TABS: 25.0000 mg | ORAL_TABLET | Freq: Every day | ORAL | 0 refills | Status: DC

## 2018-03-18 ENCOUNTER — Ambulatory Visit: Payer: 59 | Admitting: Psychology

## 2018-03-25 ENCOUNTER — Ambulatory Visit: Payer: Self-pay

## 2018-03-25 NOTE — Telephone Encounter (Signed)
Pt. Reports she has had decreased appetite since starting her Lexapro. Has gone from a size 8 to a size 4. Tries to eat , "but I just can't. I'm trying to eat things like mashed potatoes to gain weight." Reports she feels good with the Lexapro. Mood is good and no panic attacks. States she can not afford to come into the office just now, but would like some advise on what to do.  Answer Assessment - Initial Assessment Questions 1. SYMPTOMS: "Do you have any symptoms?"     No appetite since starting the Lexapro. Has lost weight. From Size 8 to a size 4 2. SEVERITY: If symptoms are present, ask "Are they mild, moderate or severe?"     Moderate  Protocols used: MEDICATION QUESTION CALL-A-AH

## 2018-03-26 NOTE — Telephone Encounter (Signed)
Notified patient of previous message per Aldona Bar and she voices understanding. Patient stated she has been taking Lexapro at night with no food. She will start taking Lexapro 5 mg in the evening with food. Also will call back with to schedule a follow up appt.

## 2018-03-26 NOTE — Telephone Encounter (Signed)
Please call patient.  I'm glad the medication is working. Sometimes it can cause decreased appetite. Has she tried to take with food to see if this makes a difference? She could try that or decreasing her Lexapro to 5 mg to see if it helps as well, although if it is her helping with her mood, that may not be the best solution.  Try to eat small, frequent meals. Drink smoothies or things with calories (and ideally protein) to get in some nutrition on a regular basis. Also, try to add calories to food that you are able to eat (add olive oil, butter or cheese to the mashed potatoes, for example.)  I do think that it is important for to follow-up with [place] to make sure that there is nothing else going on.

## 2018-03-26 NOTE — Telephone Encounter (Signed)
Please see message and advise 

## 2018-04-01 ENCOUNTER — Ambulatory Visit (INDEPENDENT_AMBULATORY_CARE_PROVIDER_SITE_OTHER): Payer: 59 | Admitting: Psychology

## 2018-04-01 ENCOUNTER — Encounter: Payer: Self-pay | Admitting: Physician Assistant

## 2018-04-01 DIAGNOSIS — F331 Major depressive disorder, recurrent, moderate: Secondary | ICD-10-CM | POA: Diagnosis not present

## 2018-04-06 ENCOUNTER — Other Ambulatory Visit: Payer: Self-pay | Admitting: Physician Assistant

## 2018-04-06 NOTE — Telephone Encounter (Signed)
Pt requesting refill

## 2018-04-15 ENCOUNTER — Ambulatory Visit (INDEPENDENT_AMBULATORY_CARE_PROVIDER_SITE_OTHER): Payer: 59 | Admitting: Psychology

## 2018-04-15 DIAGNOSIS — F331 Major depressive disorder, recurrent, moderate: Secondary | ICD-10-CM | POA: Diagnosis not present

## 2018-04-22 ENCOUNTER — Encounter: Payer: Self-pay | Admitting: Physician Assistant

## 2018-04-23 ENCOUNTER — Encounter: Payer: Self-pay | Admitting: Physician Assistant

## 2018-04-23 NOTE — Telephone Encounter (Signed)
Samantha notified. 

## 2018-04-29 ENCOUNTER — Ambulatory Visit (INDEPENDENT_AMBULATORY_CARE_PROVIDER_SITE_OTHER): Payer: 59 | Admitting: Psychology

## 2018-04-29 DIAGNOSIS — F331 Major depressive disorder, recurrent, moderate: Secondary | ICD-10-CM

## 2018-05-01 ENCOUNTER — Other Ambulatory Visit: Payer: Self-pay | Admitting: Physician Assistant

## 2018-05-04 ENCOUNTER — Ambulatory Visit (INDEPENDENT_AMBULATORY_CARE_PROVIDER_SITE_OTHER): Payer: 59 | Admitting: Physician Assistant

## 2018-05-04 ENCOUNTER — Encounter: Payer: Self-pay | Admitting: Physician Assistant

## 2018-05-04 VITALS — BP 120/76 | HR 74 | Temp 98.1°F | Ht 65.0 in | Wt 131.0 lb

## 2018-05-04 DIAGNOSIS — G4700 Insomnia, unspecified: Secondary | ICD-10-CM

## 2018-05-04 DIAGNOSIS — R634 Abnormal weight loss: Secondary | ICD-10-CM | POA: Diagnosis not present

## 2018-05-04 DIAGNOSIS — N760 Acute vaginitis: Secondary | ICD-10-CM | POA: Diagnosis not present

## 2018-05-04 DIAGNOSIS — F320 Major depressive disorder, single episode, mild: Secondary | ICD-10-CM | POA: Diagnosis not present

## 2018-05-04 DIAGNOSIS — F10982 Alcohol use, unspecified with alcohol-induced sleep disorder: Secondary | ICD-10-CM | POA: Insufficient documentation

## 2018-05-04 MED ORDER — CLONAZEPAM 0.5 MG PO TABS: 0.5000 mg | ORAL_TABLET | Freq: Every day | ORAL | 0 refills | Status: DC

## 2018-05-04 MED ORDER — METRONIDAZOLE 500 MG PO TABS: 500.0000 mg | ORAL_TABLET | Freq: Two times a day (BID) | ORAL | 0 refills | Status: AC

## 2018-05-04 NOTE — Progress Notes (Signed)
Shirley Nunez is a 42 y.o. female here for a new problem.  History of Present Illness:   Chief Complaint  Patient presents with  . weight loss from Lexapro    HPI   She states that Lexapro is helping her mood. She has noticed weight loss from starting the medication due to early satiety and decreased appetite. Denies: fevers, chills, night sweats. Denies n/d/c. Denies SI/HI. She has tried small, frequent meals but often lacks appetite for that.   Continues to have insomnia. We trialed Valium 5 mg but she is taking two to help her sleep at night, as one will help her fall asleep but two helps her stay asleep. She did not tolerate Trazodone.   One thing that she does when she cannot sleep is take baths. She has taken a bath every night for the past 7 nights. She states that she now has white vaginal discharge with fishy odor that is consistent with past BV infections.  Wt Readings from Last 10 Encounters:  05/04/18 131 lb (59.4 kg)  02/25/18 138 lb 8 oz (62.8 kg)  01/14/18 142 lb 6.4 oz (64.6 kg)  12/17/17 146 lb 3.2 oz (66.3 kg)  07/16/17 146 lb (66.2 kg)  07/10/16 142 lb 6.4 oz (64.6 kg)  11/07/15 135 lb (61.2 kg)  07/25/15 134 lb 9.6 oz (61.1 kg)  06/03/15 134 lb (60.8 kg)  08/10/14 139 lb 4 oz (63.2 kg)   GAD 7 : Generalized Anxiety Score 05/04/2018  Nervous, Anxious, on Edge 1  Control/stop worrying 2  Worry too much - different things 2  Trouble relaxing 3  Restless 1  Easily annoyed or irritable 1  Afraid - awful might happen 0  Total GAD 7 Score 10  Anxiety Difficulty Somewhat difficult   No LMP recorded (lmp unknown). Patient has had an ablation.  Depression screen [facility] 2/9 05/04/2018 02/25/2018 12/17/2017 07/16/2017 07/10/2016  Decreased Interest 1 1 1 1  0  Down, Depressed, Hopeless 2 1 1 1  0  PHQ - 2 Score 3 2 2 2  0  Altered sleeping 3 3 2 3  -  Tired, decreased energy 2 2 2 1  -  Change in appetite 3 3 1 1  -  Feeling bad or failure about yourself  1 1 1 1  -  Trouble  concentrating 1 0 3 1 -  Moving slowly or fidgety/restless 0 0 1 0 -  Suicidal thoughts 0 0 0 - -  PHQ-9 Score 13 11 12 9  -  Difficult doing work/chores Somewhat difficult Very difficult Somewhat difficult Somewhat difficult -     Past Medical History:  Diagnosis Date  . Anxiety   . Depression   . HSV infection    on daily medication  . Migraine   . Sickle cell anemia (HCC)    Sickle cell trait     Social History   Socioeconomic History  . Marital status: Married    Spouse name: Not on file  . Number of children: Not on file  . Years of education: Not on file  . Highest education level: Not on file  Occupational History  . Not on file  Social Needs  . Financial resource strain: Not on file  . Food insecurity:    Worry: Not on file    Inability: Not on file  . Transportation needs:    Medical: Not on file    Non-medical: Not on file  Tobacco Use  . Smoking status: Never Smoker  . Smokeless tobacco:  Never Used  Substance and Sexual Activity  . Alcohol use: Yes    Alcohol/week: 2.0 standard drinks    Types: 2 Glasses of wine per week  . Drug use: No  . Sexual activity: Yes    Birth control/protection: Other-see comments    Comment: ablation  Lifestyle  . Physical activity:    Days per week: Not on file    Minutes per session: Not on file  . Stress: Not on file  Relationships  . Social connections:    Talks on phone: Not on file    Gets together: Not on file    Attends religious service: Not on file    Active member of club or organization: Not on file    Attends meetings of clubs or organizations: Not on file    Relationship status: Not on file  . Intimate partner violence:    Fear of current or ex partner: Not on file    Emotionally abused: Not on file    Physically abused: Not on file    Forced sexual activity: Not on file  Other Topics Concern  . 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 WISDOM TOOTH EXTRACTION      Family History  Problem Relation Age of Onset  . Hypertension Mother   . Depression Mother   . Miscarriages / [place] Mother   . Hypertension Father   . Hyperlipidemia Father   . Diabetes Father   . Arthritis Father   . Stroke Father   . Sickle cell anemia Sister   . Alcohol abuse Maternal Grandmother   . Arthritis Maternal Grandmother   . Depression Maternal Grandmother   . Diabetes Maternal Grandmother   . Heart attack Maternal Grandmother   . Hypertension Maternal Grandmother   . Stroke Maternal Grandmother   . Heart attack Maternal Grandfather   . Lung cancer Paternal Grandmother        mets to brain  . Alcohol abuse Paternal Grandfather   . Heart attack Paternal Grandfather   . Hypertension Paternal Grandfather   . Stroke Paternal Grandfather     Allergies  Allergen Reactions  . Mushroom Extract Complex Rash  . Penicillins Rash    Current Medications:   Current Outpatient Medications:  .  escitalopram (LEXAPRO) 10 MG tablet, Take 1 tablet (10 mg total) by mouth daily., Disp: 90 tablet, Rfl: 1 .  hydrochlorothiazide (HYDRODIURIL) 25 MG tablet, Take 1 tablet (25 mg total) by mouth daily., Disp: 90 tablet, Rfl: 0 .  valACYclovir (VALTREX) 500 MG tablet, TAKE 1 TABLET BY MOUTH ONCE DAILY, Disp: 90 tablet, Rfl: 1 .  clonazePAM (KLONOPIN) 0.5 MG tablet, Take 1 tablet (0.5 mg total) by mouth at bedtime., Disp: 30 tablet, Rfl: 0 .  metroNIDAZOLE (FLAGYL) 500 MG tablet, Take 1 tablet (500 mg total) by mouth 2 (two) times daily for 7 days., Disp: 14 tablet, Rfl: 0   Review of Systems:   ROS Negative unless otherwise specified per HPI.  Vitals:   Vitals:   05/04/18 1114  BP: 120/76  Pulse: 74  Temp: 98.1 F (36.7 C)  TempSrc: Oral  SpO2: 97%  Weight: 131 lb (59.4 kg)  Height: 5\' 5"  (1.651 m)     Body mass  index is 21.8 kg/m.  Physical Exam:   Physical Exam Vitals signs and nursing note reviewed.  Constitutional:      General: She is not in acute distress.    Appearance: She is well-developed. She is not ill-appearing or toxic-appearing.  Cardiovascular:     Rate and Rhythm: Normal rate and regular rhythm.     Pulses: Normal pulses.     Heart sounds: Normal heart sounds, S1 normal and S2 normal.     Comments: No LE edema Pulmonary:     Effort: Pulmonary effort is normal.     Breath sounds: Normal breath sounds.  Skin:    General: Skin is warm and dry.  Neurological:     Mental Status: She is alert.     GCS: GCS eye subscore is 4. GCS verbal subscore is 5. GCS motor subscore is 6.  Psychiatric:        Speech: Speech normal.        Behavior: Behavior normal. Behavior is cooperative.      Assessment and Plan:   Malicia was seen today for weight loss from lexapro.  Diagnoses and all orders for this visit:  Depression, major, single episode, mild ([place]) Relatively controlled per patient. Once her emotional triggers of this week pass, she is planning to trial decreasing lexapro to 5 mg daily (if mood allows) to see if this helps with her early satiety. I asked her to send me a mychart message after a week of doing this to see if she has any improvement. We may trial switching to different SSRI or adding remeron. I discussed with patient that if they develop any SI, to tell someone immediately and seek medical attention.  Insomnia, unspecified type Uncontrolled. I told her I am not comfortable with her taking 10 mg valium nightly for sleep. Stop valium. Stop xanax if she has any remaining. Will trial low dose of klonopin. May need to send to psych if unable to further get her insomnia under control.  Weight loss She declines further work-up of this. Declines labs. Will continue to monitor. Likely secondary to decreased caloric intake.  Acute vaginitis She declines pelvic exam.  Symptoms consistent with past BV infections. Will trial flagyl. If no improvement, needs exam. Instructed to not drink alcohol while on this medication.  Other orders -     metroNIDAZOLE (FLAGYL) 500 MG tablet; Take 1 tablet (500 mg total) by mouth 2 (two) times daily for 7 days. -     clonazePAM (KLONOPIN) 0.5 MG tablet; Take 1 tablet (0.5 mg total) by mouth at bedtime.  . Reviewed expectations re: course of current medical issues. . Discussed self-management of symptoms. . Outlined signs and symptoms indicating need for more acute intervention. . Patient verbalized understanding and all questions were answered. . See orders for this visit as documented in the electronic medical record. . Patient received an After-Visit Summary.  Inda Coke, PA-C

## 2018-05-04 NOTE — Patient Instructions (Addendum)
It was great to see you!  1. Start flagyl for BV symptoms -- do not drink alcohol on this medication. Follow-up if symptoms persist.  2. Cut Lexapro tablets in half. Take 5 mg daily. Update me in 1 week via MyChart after doing this on your appetite and weight.  3. We are going to try klonopin for sleep. Stop xanax. Stop valium.  Let's follow-up in 1 month, sooner if you have concerns.  Take care,  Inda Coke PA-C

## 2018-05-18 DIAGNOSIS — M546 Pain in thoracic spine: Secondary | ICD-10-CM | POA: Diagnosis not present

## 2018-05-18 DIAGNOSIS — M545 Low back pain: Secondary | ICD-10-CM | POA: Diagnosis not present

## 2018-05-18 DIAGNOSIS — M9902 Segmental and somatic dysfunction of thoracic region: Secondary | ICD-10-CM | POA: Diagnosis not present

## 2018-05-20 ENCOUNTER — Ambulatory Visit (INDEPENDENT_AMBULATORY_CARE_PROVIDER_SITE_OTHER): Payer: 59 | Admitting: Psychology

## 2018-05-20 DIAGNOSIS — F331 Major depressive disorder, recurrent, moderate: Secondary | ICD-10-CM | POA: Diagnosis not present

## 2018-05-23 ENCOUNTER — Other Ambulatory Visit: Payer: Self-pay | Admitting: Physician Assistant

## 2018-05-30 ENCOUNTER — Other Ambulatory Visit: Payer: Self-pay | Admitting: Physician Assistant

## 2018-06-03 ENCOUNTER — Ambulatory Visit (INDEPENDENT_AMBULATORY_CARE_PROVIDER_SITE_OTHER): Payer: 59 | Admitting: Psychology

## 2018-06-03 DIAGNOSIS — F331 Major depressive disorder, recurrent, moderate: Secondary | ICD-10-CM | POA: Diagnosis not present

## 2018-06-17 ENCOUNTER — Ambulatory Visit: Payer: 59 | Admitting: Psychology

## 2018-06-30 ENCOUNTER — Ambulatory Visit: Payer: 59 | Admitting: Psychology

## 2018-06-30 DIAGNOSIS — F331 Major depressive disorder, recurrent, moderate: Secondary | ICD-10-CM

## 2018-07-01 ENCOUNTER — Ambulatory Visit: Payer: 59 | Admitting: Psychology

## 2018-07-03 ENCOUNTER — Other Ambulatory Visit: Payer: Self-pay | Admitting: Physician Assistant

## 2018-07-07 ENCOUNTER — Encounter: Payer: Self-pay | Admitting: Physician Assistant

## 2018-07-08 ENCOUNTER — Other Ambulatory Visit: Payer: Self-pay | Admitting: Physician Assistant

## 2018-07-08 ENCOUNTER — Ambulatory Visit
Admission: RE | Admit: 2018-07-08 | Discharge: 2018-07-08 | Disposition: A | Discharge status: Home / Self Care | Payer: 59 | Source: Ambulatory Visit | Attending: Physician Assistant | Admitting: Physician Assistant

## 2018-07-08 ENCOUNTER — Encounter: Payer: Self-pay | Admitting: Physician Assistant

## 2018-07-08 ENCOUNTER — Ambulatory Visit: Payer: 59 | Admitting: Physician Assistant

## 2018-07-08 VITALS — BP 112/80 | HR 81 | Temp 97.7°F | Ht 65.0 in | Wt 130.0 lb

## 2018-07-08 DIAGNOSIS — G4700 Insomnia, unspecified: Secondary | ICD-10-CM

## 2018-07-08 DIAGNOSIS — N644 Mastodynia: Secondary | ICD-10-CM

## 2018-07-08 DIAGNOSIS — F320 Major depressive disorder, single episode, mild: Secondary | ICD-10-CM

## 2018-07-08 DIAGNOSIS — N6310 Unspecified lump in the right breast, unspecified quadrant: Secondary | ICD-10-CM

## 2018-07-08 LAB — POCT URINE PREGNANCY: Preg Test, Ur: NEGATIVE

## 2018-07-08 MED ORDER — CLONAZEPAM 0.5 MG PO TABS: 0.5000 mg | ORAL_TABLET | Freq: Two times a day (BID) | ORAL | 0 refills | Status: DC | PRN

## 2018-07-08 MED ORDER — ESCITALOPRAM OXALATE 10 MG PO TABS: 10.0000 mg | ORAL_TABLET | Freq: Every day | ORAL | 1 refills | Status: DC

## 2018-07-08 NOTE — Progress Notes (Signed)
Shirley Nunez is a 43 y.o. female here for a new problem.  I acted as a Education administrator for [employer], PA-C Anselmo Pickler, LPN  History of Present Illness:   Chief Complaint  Patient presents with  . Breast Pain both    x2weeks  . Rt breast leakage    HPI   Anxiety and depression --Pt says she is doing little better from last visit. Pt is going to therapy every 2 weeks, seeing  Trey Paula. She is taking Lexapro 10 mg daily. Klonopin is helping sleep. Does sometime have to take a second klonopin at night to help her sleep, maybe two times a week. Denies SI/HI.  Breast pain -- Pt c/o bilateral breast pain x 2 weeks. Pt is having clear drainage from Right nipple. Pt has not felt any lumps she is too tender to do a good breast exam. Does not have periods, had an ablation about 5-6 years ago. She is sexually active. No family history of gynecological/breast cancers.     Past Medical History:  Diagnosis Date  . Anxiety   . Depression   . HSV infection    on daily medication  . Migraine   . Sickle cell anemia (HCC)    Sickle cell trait     Social History   Socioeconomic History  . Marital status: Married    Spouse name: Not on file  . Number of children: Not on file  . Years of education: Not on file  . Highest education level: Not on file  Occupational History  . Not on file  Social Needs  . Financial resource strain: Not on file  . Food insecurity:    Worry: Not on file    Inability: Not on file  . Transportation needs:    Medical: Not on file    Non-medical: Not on file  Tobacco Use  . Smoking status: Never Smoker  . Smokeless tobacco: Never Used  Substance and Sexual Activity  . Alcohol use: Yes    Alcohol/week: 2.0 standard drinks    Types: 2 Glasses of wine per week  . Drug use: No  . Sexual activity: Yes    Birth control/protection: Other-see comments    Comment: ablation  Lifestyle  . Physical activity:    Days per week: Not on file    Minutes per  session: Not on file  . Stress: Not on file  Relationships  . Social connections:    Talks on phone: Not on file    Gets together: Not on file    Attends religious service: Not on file    Active member of club or organization: Not on file    Attends meetings of clubs or organizations: Not on file    Relationship status: Not on file  . Intimate partner violence:    Fear of current or ex partner: Not on file    Emotionally abused: Not on file    Physically abused: Not on file    Forced sexual activity: Not on file  Other Topics Concern  . Not on file  Social History Narrative   Loss adjuster, chartered at [employer]    Married   2 kids   Likes: writing and reading   One of her sons passed away 36 years ago    Past Surgical History:  Procedure Laterality Date  . WISDOM TOOTH EXTRACTION      Family History  Problem Relation Age of Onset  . Hypertension Mother   . Depression  Mother   . Miscarriages / [place] Mother   . Hypertension Father   . Hyperlipidemia Father   . Diabetes Father   . Arthritis Father   . Stroke Father   . Sickle cell anemia Sister   . Alcohol abuse Maternal Grandmother   . Arthritis Maternal Grandmother   . Depression Maternal Grandmother   . Diabetes Maternal Grandmother   . Heart attack Maternal Grandmother   . Hypertension Maternal Grandmother   . Stroke Maternal Grandmother   . Heart attack Maternal Grandfather   . Lung cancer Paternal Grandmother        mets to brain  . Alcohol abuse Paternal Grandfather   . Heart attack Paternal Grandfather   . Hypertension Paternal Grandfather   . Stroke Paternal Grandfather     Allergies  Allergen Reactions  . Mushroom Extract Complex Rash  . Penicillins Rash    Current Medications:   Current Outpatient Medications:  .  clonazePAM (KLONOPIN) 0.5 MG tablet, Take 1 tablet (0.5 mg total) by mouth 2 (two) times daily as needed for anxiety., Disp: 60 tablet, Rfl: 0 .  escitalopram (LEXAPRO) 10 MG tablet,  Take 1 tablet (10 mg total) by mouth daily., Disp: 90 tablet, Rfl: 1 .  hydrochlorothiazide (HYDRODIURIL) 25 MG tablet, TAKE 1 TABLET BY MOUTH ONCE DAILY, Disp: 90 tablet, Rfl: 0 .  valACYclovir (VALTREX) 500 MG tablet, TAKE 1 TABLET BY MOUTH ONCE DAILY, Disp: 90 tablet, Rfl: 1   Review of Systems:   Review of Systems  Constitutional: Negative for chills, fever, malaise/fatigue and weight loss.  Respiratory: Negative for shortness of breath.   Cardiovascular: Negative for chest pain, orthopnea, claudication and leg swelling.  Gastrointestinal: Negative for heartburn, nausea and vomiting.  Neurological: Negative for dizziness, tingling and headaches.    Vitals:   Vitals:   07/08/18 1045  BP: 112/80  Pulse: 81  Temp: 97.7 F (36.5 C)  TempSrc: Oral  SpO2: 98%  Weight: 130 lb (59 kg)  Height: 5\' 5"  (1.651 m)     Body mass index is 21.63 kg/m.  Physical Exam:   Physical Exam Vitals signs and nursing note reviewed.  Constitutional:      General: She is not in acute distress.    Appearance: She is well-developed. She is not ill-appearing or toxic-appearing.  Cardiovascular:     Rate and Rhythm: Normal rate and regular rhythm.     Pulses: Normal pulses.     Heart sounds: Normal heart sounds, S1 normal and S2 normal.     Comments: No LE edema Pulmonary:     Effort: Pulmonary effort is normal.     Breath sounds: Normal breath sounds.  Chest:     Comments: Extreme tenderness to bilateral breasts and axilla. Clear fluid was able to be expressed from R nipple. Limited exam today 2/2 pain. Skin:    General: Skin is warm and dry.  Neurological:     Mental Status: She is alert.     GCS: GCS eye subscore is 4. GCS verbal subscore is 5. GCS motor subscore is 6.  Psychiatric:        Speech: Speech normal.        Behavior: Behavior normal. Behavior is cooperative.    Results for orders placed or performed in visit on 07/08/18  POCT urine pregnancy  Result Value Ref Range    Preg Test, Ur Negative Negative      Assessment and Plan:   Shirley Nunez was seen today for breast pain  both and rt breast leakage.  Diagnoses and all orders for this visit:  Breast tenderness Mammogram appointment secured for today. Further intervention based on results. -     POCT urine pregnancy -     MM DIAG BREAST TOMO BILATERAL; Future -     US BREAST LTD UNI LEFT INC AXILLA; Future -     US BREAST LTD UNI RIGHT INC AXILLA; Future  Depression Overall well controlled. Continue Lexapro and therapy with Trey Paula.  Insomnia Klonopin prn.   Other orders -     escitalopram (LEXAPRO) 10 MG tablet; Take 1 tablet (10 mg total) by mouth daily. -     clonazePAM (KLONOPIN) 0.5 MG tablet; Take 1 tablet (0.5 mg total) by mouth 2 (two) times daily as needed for anxiety.    . Reviewed expectations re: course of current medical issues. . Discussed self-management of symptoms. . Outlined signs and symptoms indicating need for more acute intervention. . Patient verbalized understanding and all questions were answered. . See orders for this visit as documented in the electronic medical record. . Patient received an After-Visit Summary.  CMA or LPN served as scribe during this visit. History, Physical, and Plan performed by medical provider. The above documentation has been reviewed and is accurate and complete.   Inda Coke, PA-C

## 2018-07-08 NOTE — Patient Instructions (Signed)
It was great to see you!           Let's follow-up in 3 months for your mood, sooner if you have concerns.  Take care,  Inda Coke PA-C

## 2018-07-13 ENCOUNTER — Ambulatory Visit
Admission: RE | Admit: 2018-07-13 | Discharge: 2018-07-13 | Disposition: A | Discharge status: Home / Self Care | Payer: 59 | Source: Ambulatory Visit | Attending: Physician Assistant | Admitting: Physician Assistant

## 2018-07-13 ENCOUNTER — Other Ambulatory Visit: Payer: Self-pay | Admitting: Physician Assistant

## 2018-07-13 DIAGNOSIS — N6310 Unspecified lump in the right breast, unspecified quadrant: Secondary | ICD-10-CM

## 2018-07-14 ENCOUNTER — Ambulatory Visit (INDEPENDENT_AMBULATORY_CARE_PROVIDER_SITE_OTHER): Payer: 59 | Admitting: Psychology

## 2018-07-14 ENCOUNTER — Other Ambulatory Visit: Payer: Self-pay | Admitting: Physician Assistant

## 2018-07-14 DIAGNOSIS — F331 Major depressive disorder, recurrent, moderate: Secondary | ICD-10-CM | POA: Diagnosis not present

## 2018-07-14 MED ORDER — METRONIDAZOLE 500 MG PO TABS: 500.0000 mg | ORAL_TABLET | Freq: Two times a day (BID) | ORAL | 0 refills | Status: AC

## 2018-07-15 ENCOUNTER — Ambulatory Visit: Payer: 59 | Admitting: Psychology

## 2018-07-26 ENCOUNTER — Ambulatory Visit: Payer: Self-pay | Admitting: Surgery

## 2018-07-26 DIAGNOSIS — D241 Benign neoplasm of right breast: Secondary | ICD-10-CM

## 2018-07-26 NOTE — H&P (Signed)
Shirley Nunez Documented: 07/26/2018 10:02 AM Location: [facility] Patient #: [id number] DOB: [birth year] Separated / Language: Shirley Nunez / Race: Black or African American Female  History of Present Illness Shirley Moores A. Srihith Aquilino Nunez; 07/26/2018 10:19 AM) Patient words: Diagnosis 1. Breast, right, needle core biopsy, 7 o'clock retroareolar - DUCTAL PAPILLOMA. - FIBROCYSTIC CHANGES. - SEE MICROSCOPIC DESCRIPTION. 2. Breast, right, needle core biopsy, 6 o'clock retroareolar - FIBROADENOMA. - SEE MICROSCOPIC DESCRIPTION. Microscopic      CLINICAL DATA: 43 year old presenting with a 2 week history of diffuse BILATERAL breast pain. Patient also states that she has had a spontaneous clear RIGHT nipple discharge over the past 1 week. She is unsure if the discharge emanates from a single duct orifice or multiple duct orifices.  EXAM: DIGITAL DIAGNOSTIC BILATERAL MAMMOGRAM WITH CAD AND TOMO  LIMITED ULTRASOUND BILATERAL BREASTS  COMPARISON: Mammography 07/16/2016 from [facility]. No prior ultrasound.  ACR Breast Density Category c: The breast tissue is heterogeneously dense, which may obscure small masses.  FINDINGS: Tomosynthesis and synthesized full field CC and MLO views of both breasts were obtained. Tomosynthesis and synthesized spot compression MLO views of the subareolar location of both breasts were also obtained.  No findings suspicious for malignancy in either breast. Specifically, no mammographic abnormalities in the subareolar location of either breast to explain the BILATERAL nipple discharge.  Mammographic images were processed with CAD.  Targeted RIGHT breast ultrasound is performed, showing upper normal caliber ducts at the 6-7 o'clock subareolar location. Adjacent to a duct at the 6 o'clock subareolar location is a superficial oval hypoechoic mass with macrolobular margins measuring approximately 0.5 x 1.2 x 0.5 cm, demonstrating no posterior  characteristics and demonstrating internal power Doppler flow. At the 7 o'clock subareolar location there is an intraductal hypoechoic mass which extends into an adjacent branch of the duct. The mass measures approximately 0.6 x 1.0 x 0.7 cm and is immediately adjacent to the mass at 6 o'clock.  Sonographic evaluation of the RIGHT axilla demonstrates no pathologic lymphadenopathy.  Targeted LEFT breast ultrasound is performed in the subareolar location showing normal fibroglandular tissue. There is no cyst, solid mass, intraductal mass or duct ectasia.  IMPRESSION: 1. Adjacent masses at the 6-7 o'clock subareolar location of the RIGHT breast. The mass at the 7 o'clock location is intraductal and the mass at the 6 o'clock location is superficial and may also be intraductal. 2. No mammographic or sonographic evidence of malignancy involving the LEFT breast. 3. No pathologic RIGHT axillary lymphadenopathy.  RECOMMENDATION: Ultrasound-guided core needle biopsy of the adjacent masses in the LOWER subareolar RIGHT breast.  I have discussed the findings and recommendations with the patient. Results were also provided in writing at the conclusion of the visit.  BI-RADS CATEGORY 4: Suspicious.   Electronically Signed By: Shirley Nunez M.D. On: 07/08/2018 15:29.  The patient is a 43 year old female.   Past Surgical History Shirley Nunez, Shirley Nunez; 07/26/2018 10:02 AM) Breast Biopsy Right.  Diagnostic Studies History Shirley Nunez, Shirley Nunez; 07/26/2018 10:02 AM) Colonoscopy never Mammogram 1-3 years ago  Allergies Shirley Nunez, Shirley Nunez; 07/26/2018 10:02 AM) Penicillins Allergies Reconciled  Medication History Shirley Nunez, Shirley Nunez; 07/26/2018 10:03 AM) clonazePAM (0.5MG  Tablet, Oral) Active. hydroCHLOROthiazide (25MG  Tablet, Oral) Active. valACYclovir HCl (500MG  Tablet, Oral) Active. metroNIDAZOLE (500MG  Tablet, Oral) Active. Medications Reconciled  Social History  Shirley Nunez, Shirley Nunez; 07/26/2018 10:02 AM) Alcohol use Moderate alcohol use. Caffeine use Coffee. No drug use Tobacco use Never smoker.  Family History Shirley Nunez, [place]; 07/26/2018 10:02 AM)  Bleeding disorder Sister. Diabetes Mellitus Father. Hypertension Father.  Pregnancy / Birth History Shirley Nunez, [place]; 07/26/2018 10:02 AM) Age at menarche 43 years. Gravida 6 Irregular periods Length (months) of breastfeeding 12-24 Maternal age 76-20 Para 3  Other Problems Shirley Nunez, [place]; 07/26/2018 10:02 AM) No pertinent past medical history     Review of Systems Shirley Nunez Shirley Nunez; 07/26/2018 10:02 AM) General Present- Weight Loss. Not Present- Appetite Loss, Chills, Fatigue, Fever, Night Sweats and Weight Gain. HEENT Not Present- Earache, Hearing Loss, Hoarseness, Nose Bleed, Oral Ulcers, Ringing in the Ears, Seasonal Allergies, Sinus Pain, Sore Throat, Visual Disturbances, Wears glasses/contact lenses and Yellow Eyes. Respiratory Not Present- Bloody sputum, Chronic Cough, Difficulty Breathing, Snoring and Wheezing. Breast Present- Breast Pain. Not Present- Breast Mass, Nipple Discharge and Skin Changes. Cardiovascular Not Present- Chest Pain, Difficulty Breathing Lying Down, Leg Cramps, Palpitations, Rapid Heart Rate, Shortness of Breath and Swelling of Extremities. Gastrointestinal Not Present- Abdominal Pain, Bloating, Bloody Stool, Change in Bowel Habits, Chronic diarrhea, Constipation, Difficulty Swallowing, Excessive gas, Gets full quickly at meals, Hemorrhoids, Indigestion, Nausea, Rectal Pain and Vomiting. Female Genitourinary Not Present- Frequency, Nocturia, Painful Urination, Pelvic Pain and Urgency. Musculoskeletal Not Present- Back Pain, Joint Pain, Joint Stiffness, Muscle Pain, Muscle Weakness and Swelling of Extremities. Neurological Not Present- Decreased Memory, Fainting, Headaches, Numbness, Seizures, Tingling, Tremor, Trouble walking and  Weakness. Endocrine Not Present- Cold Intolerance, Excessive Hunger, Hair Changes, Heat Intolerance, Hot flashes and New Diabetes. Hematology Not Present- Blood Thinners, Easy Bruising, Excessive bleeding, Gland problems, HIV and Persistent Infections.  Vitals Shirley Nunez Shirley Nunez; 07/26/2018 10:04 AM) 07/26/2018 10:03 AM Weight: 129.13 lb Height: 65in Body Surface Area: 1.64 m Body Mass Index: 21.49 kg/m  Temp.: 1F(Oral)  Pulse: 86 (Regular)  P.OX: 99% (Room air) BP: 110/72 (Sitting, Left Arm, Standard)      Physical Exam (Shirley Nunez; 07/26/2018 12:44 PM)  General Mental Status-Alert. General Appearance-Consistent with stated age. Hydration-Well hydrated. Voice-Normal.  Head and Neck Head-normocephalic, atraumatic with no lesions or palpable masses. Trachea-midline. Thyroid Gland Characteristics - normal size and consistency.  Eye Eyeball - Bilateral-Extraocular movements intact. Sclera/Conjunctiva - Bilateral-No scleral icterus.  Breast Breast - Left-Symmetric, Non Tender, No Biopsy scars, no Dimpling, No Inflammation, No Lumpectomy scars, No Mastectomy scars, No Peau d' Orange. Breast - Right-Symmetric, Non Tender, No Biopsy scars, no Dimpling, No Inflammation, No Lumpectomy scars, No Mastectomy scars, No Peau d' Orange. Breast Lump-No Palpable Breast Mass.  Neurologic Neurologic evaluation reveals -alert and oriented x 3 with no impairment of recent or remote memory. Mental Status-Normal.  Musculoskeletal Normal Exam - Left-Upper Extremity Strength Normal and Lower Extremity Strength Normal. Normal Exam - Right-Upper Extremity Strength Normal and Lower Extremity Strength Normal.  Lymphatic Head & Neck  General Head & Neck Lymphatics: Bilateral - Description - Normal. Axillary  General Axillary Region: Bilateral - Description - Normal. Tenderness - Non Tender.    Assessment & Plan (Shirley Nunez;  07/26/2018 12:45 PM)  PAPILLOMA OF RIGHT BREAST (D24.1) Impression: Patient desires lumpectomy. Risk of lumpectomy include bleeding, infection, seroma, more surgery, use of seed/wire, wound care, cosmetic deformity and the need for other treatments, death , blood clots, death. Pt agrees to proceed.   BREAST FIBROADENOMA, RIGHT (D24.1) Impression: Risk of lumpectomy include bleeding, infection, seroma, more surgery, use of seed/wire, wound care, cosmetic deformity and the need for other treatments, death , blood clots, death. Pt agrees to proceed. Patient desires lumpectomy.  Current Plans You are being scheduled for surgery- Our  schedulers will call you.  You should hear from our office's scheduling department within 5 working days about the location, date, and time of surgery. We try to make accommodations for patient's preferences in scheduling surgery, but sometimes the OR schedule or the surgeon's schedule prevents [place] from making those accommodations.  If you have not heard from our office [phone]) in 5 working days, call the office and ask for your surgeon's nurse.  If you have other questions about your diagnosis, plan, or surgery, call the office and ask for your surgeon's nurse.  Pt Education - C[facility] Breast Biopsy HCI: discussed with patient and provided information.

## 2018-07-29 ENCOUNTER — Ambulatory Visit: Payer: 59 | Admitting: Psychology

## 2018-08-05 ENCOUNTER — Ambulatory Visit (INDEPENDENT_AMBULATORY_CARE_PROVIDER_SITE_OTHER): Payer: 59 | Admitting: Psychology

## 2018-08-05 DIAGNOSIS — F331 Major depressive disorder, recurrent, moderate: Secondary | ICD-10-CM | POA: Diagnosis not present

## 2018-08-09 ENCOUNTER — Other Ambulatory Visit: Payer: Self-pay | Admitting: Surgery

## 2018-08-09 DIAGNOSIS — D241 Benign neoplasm of right breast: Secondary | ICD-10-CM

## 2018-08-10 ENCOUNTER — Other Ambulatory Visit: Payer: Self-pay | Admitting: Physician Assistant

## 2018-08-11 ENCOUNTER — Telehealth: Payer: Self-pay | Admitting: Physician Assistant

## 2018-08-11 ENCOUNTER — Other Ambulatory Visit: Payer: Self-pay | Admitting: Physician Assistant

## 2018-08-11 ENCOUNTER — Encounter: Payer: Self-pay | Admitting: Physician Assistant

## 2018-08-11 MED ORDER — CLONAZEPAM 0.5 MG PO TABS: 0.5000 mg | ORAL_TABLET | Freq: Two times a day (BID) | ORAL | 0 refills | Status: DC | PRN

## 2018-08-11 MED ORDER — VALACYCLOVIR HCL 500 MG PO TABS: 500.0000 mg | ORAL_TABLET | Freq: Every day | ORAL | 1 refills | Status: DC

## 2018-08-11 MED ORDER — HYDROCHLOROTHIAZIDE 25 MG PO TABS: 25.0000 mg | ORAL_TABLET | Freq: Every day | ORAL | 0 refills | Status: DC

## 2018-08-11 NOTE — Telephone Encounter (Signed)
Copied from [place] [id number]. Topic: General - Other >> Aug 11, 2018  1:38 PM Lennox Solders wrote: Reason for CRM: pt is calling and would like to know why her medications valtrex,hctz and clonazepam was denied. Pt last seen samantha on 07-08-2018. [facility] on [address]

## 2018-08-11 NOTE — Telephone Encounter (Signed)
Meds sent in and patient notified via [place] message.

## 2018-08-11 NOTE — Telephone Encounter (Signed)
Please see message and advise 

## 2018-08-12 ENCOUNTER — Other Ambulatory Visit: Payer: Self-pay

## 2018-08-12 ENCOUNTER — Ambulatory Visit (INDEPENDENT_AMBULATORY_CARE_PROVIDER_SITE_OTHER): Payer: 59 | Admitting: Psychology

## 2018-08-12 DIAGNOSIS — F331 Major depressive disorder, recurrent, moderate: Secondary | ICD-10-CM | POA: Diagnosis not present

## 2018-08-24 ENCOUNTER — Ambulatory Visit (INDEPENDENT_AMBULATORY_CARE_PROVIDER_SITE_OTHER): Payer: 59 | Admitting: Psychology

## 2018-08-24 DIAGNOSIS — F331 Major depressive disorder, recurrent, moderate: Secondary | ICD-10-CM | POA: Diagnosis not present

## 2018-09-09 ENCOUNTER — Ambulatory Visit (INDEPENDENT_AMBULATORY_CARE_PROVIDER_SITE_OTHER): Payer: 59 | Admitting: Psychology

## 2018-09-09 DIAGNOSIS — F331 Major depressive disorder, recurrent, moderate: Secondary | ICD-10-CM | POA: Diagnosis not present

## 2018-09-20 ENCOUNTER — Ambulatory Visit: Payer: 59 | Admitting: Psychology

## 2018-09-21 ENCOUNTER — Ambulatory Visit (HOSPITAL_BASED_OUTPATIENT_CLINIC_OR_DEPARTMENT_OTHER): Admission: RE | Admit: 2018-09-21 | Payer: 59 | Source: Home / Self Care | Admitting: Surgery

## 2018-09-21 ENCOUNTER — Encounter (HOSPITAL_BASED_OUTPATIENT_CLINIC_OR_DEPARTMENT_OTHER): Admission: RE | Payer: Self-pay | Source: Home / Self Care

## 2018-09-21 SURGERY — BREAST LUMPECTOMY WITH RADIOACTIVE SEED LOCALIZATION
Anesthesia: General | Site: Breast | Laterality: Right

## 2018-09-23 ENCOUNTER — Ambulatory Visit (INDEPENDENT_AMBULATORY_CARE_PROVIDER_SITE_OTHER): Payer: 59 | Admitting: Psychology

## 2018-09-23 DIAGNOSIS — F331 Major depressive disorder, recurrent, moderate: Secondary | ICD-10-CM

## 2018-10-06 ENCOUNTER — Other Ambulatory Visit: Payer: Self-pay | Admitting: Physician Assistant

## 2018-10-07 ENCOUNTER — Ambulatory Visit: Payer: 59 | Admitting: Psychology

## 2018-10-07 NOTE — Telephone Encounter (Signed)
Pt requesting refill on Clonazepam 0.5 mg, last filled 08/11/18, # 60 tablets. Last visit 07/08/18.

## 2018-10-11 ENCOUNTER — Ambulatory Visit (INDEPENDENT_AMBULATORY_CARE_PROVIDER_SITE_OTHER): Payer: 59 | Admitting: Psychology

## 2018-10-11 DIAGNOSIS — F331 Major depressive disorder, recurrent, moderate: Secondary | ICD-10-CM

## 2018-10-21 ENCOUNTER — Ambulatory Visit: Payer: 59 | Admitting: Psychology

## 2018-11-03 ENCOUNTER — Ambulatory Visit (INDEPENDENT_AMBULATORY_CARE_PROVIDER_SITE_OTHER): Payer: 59 | Admitting: Psychology

## 2018-11-03 DIAGNOSIS — F331 Major depressive disorder, recurrent, moderate: Secondary | ICD-10-CM | POA: Diagnosis not present

## 2018-11-07 ENCOUNTER — Other Ambulatory Visit: Payer: Self-pay | Admitting: Physician Assistant

## 2018-11-08 ENCOUNTER — Encounter: Payer: Self-pay | Admitting: Physician Assistant

## 2018-11-08 NOTE — Telephone Encounter (Signed)
Shirley Nunez, please call and schedule virtual visit with [facility]. Follow up on Depression.

## 2018-11-08 NOTE — Telephone Encounter (Signed)
Requesting refill on Clonazepam 0.5 mg, last OV 07/08/2018, Last Rx 10/07/2018, # 60, refill 0.

## 2018-11-09 ENCOUNTER — Telehealth: Payer: Self-pay

## 2018-11-09 ENCOUNTER — Encounter: Payer: Self-pay | Admitting: Physician Assistant

## 2018-11-09 ENCOUNTER — Ambulatory Visit (INDEPENDENT_AMBULATORY_CARE_PROVIDER_SITE_OTHER): Payer: 59 | Admitting: Physician Assistant

## 2018-11-09 VITALS — Ht 65.0 in | Wt 130.0 lb

## 2018-11-09 DIAGNOSIS — F320 Major depressive disorder, single episode, mild: Secondary | ICD-10-CM | POA: Diagnosis not present

## 2018-11-09 DIAGNOSIS — G4700 Insomnia, unspecified: Secondary | ICD-10-CM

## 2018-11-09 MED ORDER — CLONAZEPAM 0.5 MG PO TABS: 0.5000 mg | ORAL_TABLET | Freq: Two times a day (BID) | ORAL | 2 refills | Status: DC | PRN

## 2018-11-09 MED ORDER — ESCITALOPRAM OXALATE 10 MG PO TABS: 10.0000 mg | ORAL_TABLET | Freq: Every day | ORAL | 1 refills | Status: DC

## 2018-11-09 NOTE — Telephone Encounter (Signed)
Left detailed voicemail message on patient's cell phone requesting a c/b to review meds, pre-screen prior to 1 pm appt today

## 2018-11-09 NOTE — Progress Notes (Signed)
Virtual Visit via Video   I connected with Shirley Nunez on 11/09/18 at  1:00 PM EDT by a video enabled telemedicine application and verified that I am speaking with the correct person using two identifiers. Location patient: Home Location provider: [facility], Office Persons participating in the virtual visit: Carleta Z Sizer, Inda Coke PA-C  I discussed the limitations of evaluation and management by telemedicine and the availability of in person appointments. The patient expressed understanding and agreed to proceed.  Subjective:   HPI:   Depression and Insomnia Continues on Lexapro 10 mg daily and Klonopin 0.5 mg BID. She is currently tolerating both medications well. She was taking Klonopin 0.5 mg x 2 at night, but now she is down to 1 - 1.5 tablets daily.  She continues to work with Lattie Haw as her divorce process continues. Denies any SI/HI. Her goal is to stop all medications, or at least the klonopin by the end of the year.   GAD 7 : Generalized Anxiety Score 11/09/2018 07/08/2018 05/04/2018  Nervous, Anxious, on Edge 1 1 1   Control/stop worrying 1 2 2   Worry too much - different things 1 2 2   Trouble relaxing 1 3 3   Restless 0 1 1  Easily annoyed or irritable 2 1 1   Afraid - awful might happen 1 2 0  Total GAD 7 Score 7 12 10   Anxiety Difficulty Not difficult at all Somewhat difficult Somewhat difficult    Depression screen [facility] 2/9 11/09/2018 07/08/2018 05/04/2018 02/25/2018 12/17/2017  Decreased Interest 0 1 1 1 1   Down, Depressed, Hopeless 1 1 2 1 1   PHQ - 2 Score 1 2 3 2 2   Altered sleeping 3 3 3 3 2   Tired, decreased energy 1 2 2 2 2   Change in appetite 0 3 3 3 1   Feeling bad or failure about yourself  1 1 1 1 1   Trouble concentrating 0 1 1 0 3  Moving slowly or fidgety/restless 0 0 0 0 1  Suicidal thoughts 0 0 0 0 0  PHQ-9 Score 6 12 13 11 12   Difficult doing work/chores Not difficult at all Somewhat difficult Somewhat difficult Very difficult Somewhat  difficult     ROS: See pertinent positives and negatives per HPI.  Patient Active Problem List   Diagnosis Date Noted  . Depression, major, single episode, mild ([place]) 05/04/2018  . Insomnia 05/04/2018  . Migraine 07/16/2017  . Joint stiffness 11/09/2015  . Herpes simplex of female genitalia 08/16/2014  . Essential hypertension 07/12/2014  . Sickle cell trait ([place]) 07/12/2014    Social History   Tobacco Use  . Smoking status: Never Smoker  . Smokeless tobacco: Never Used  Substance Use Topics  . Alcohol use: Yes    Alcohol/week: 2.0 standard drinks    Types: 2 Glasses of wine per week    Current Outpatient Medications:  Marland Kitchen  Multiple Vitamin (MULTIVITAMIN) tablet, Take 1 tablet by mouth daily., Disp: , Rfl:  .  clonazePAM (KLONOPIN) 0.5 MG tablet, Take 1 tablet (0.5 mg total) by mouth 2 (two) times daily as needed. for anxiety, Disp: 60 tablet, Rfl: 2 .  escitalopram (LEXAPRO) 10 MG tablet, Take 1 tablet (10 mg total) by mouth daily., Disp: 90 tablet, Rfl: 1 .  hydrochlorothiazide (HYDRODIURIL) 25 MG tablet, Take 1 tablet (25 mg total) by mouth daily., Disp: 90 tablet, Rfl: 0 .  valACYclovir (VALTREX) 500 MG tablet, Take 1 tablet (500 mg total) by mouth daily., Disp: 90  tablet, Rfl: 1  Allergies  Allergen Reactions  . Mushroom Extract Complex Rash  . Penicillins Rash    Objective:   VITALS: Per patient if applicable, see vitals. GENERAL: Alert, appears well and in no acute distress. HEENT: Atraumatic, conjunctiva clear, no obvious abnormalities on inspection of external nose and ears. NECK: Normal movements of the head and neck. CARDIOPULMONARY: No increased WOB. Speaking in clear sentences. I:E ratio WNL.  MS: Moves all visible extremities without noticeable abnormality. PSYCH: Pleasant and cooperative, well-groomed. Speech normal rate and rhythm. Affect is appropriate. Insight and judgement are appropriate. Attention is focused, linear, and appropriate.  NEURO: CN  grossly intact. Oriented as arrived to appointment on time with no prompting. Moves both UE equally.  SKIN: No obvious lesions, wounds, erythema, or cyanosis noted on face or hands.  Assessment and Plan:   Shirley Nunez was seen today for medication refill.  Diagnoses and all orders for this visit:  Depression, major, single episode, mild ([place]); Insomnia, unspecified type Controlled. Refill medications. I would like to see her in about 4 months so we can discuss weaning off klonopin at that time. She is in agreement. Follow-up sooner if any concerns.  Other orders -     escitalopram (LEXAPRO) 10 MG tablet; Take 1 tablet (10 mg total) by mouth daily. -     clonazePAM (KLONOPIN) 0.5 MG tablet; Take 1 tablet (0.5 mg total) by mouth 2 (two) times daily as needed. for anxiety   . Reviewed expectations re: course of current medical issues. . Discussed self-management of symptoms. . Outlined signs and symptoms indicating need for more acute intervention. . Patient verbalized understanding and all questions were answered. Marland Kitchen Health Maintenance issues including appropriate healthy diet, exercise, and smoking avoidance were discussed with patient. . See orders for this visit as documented in the electronic medical record.  I discussed the assessment and treatment plan with the patient. The patient was provided an opportunity to ask questions and all were answered. The patient agreed with the plan and demonstrated an understanding of the instructions.   The patient was advised to call back or seek an in-person evaluation if the symptoms worsen or if the condition fails to improve as anticipated.   [place], [place] 11/09/2018

## 2018-11-18 ENCOUNTER — Ambulatory Visit (INDEPENDENT_AMBULATORY_CARE_PROVIDER_SITE_OTHER): Payer: 59 | Admitting: Psychology

## 2018-11-18 DIAGNOSIS — F331 Major depressive disorder, recurrent, moderate: Secondary | ICD-10-CM

## 2018-11-20 ENCOUNTER — Other Ambulatory Visit: Payer: Self-pay | Admitting: Physician Assistant

## 2018-12-01 ENCOUNTER — Ambulatory Visit (INDEPENDENT_AMBULATORY_CARE_PROVIDER_SITE_OTHER): Payer: 59 | Admitting: Psychology

## 2018-12-01 DIAGNOSIS — F331 Major depressive disorder, recurrent, moderate: Secondary | ICD-10-CM

## 2018-12-14 ENCOUNTER — Ambulatory Visit (INDEPENDENT_AMBULATORY_CARE_PROVIDER_SITE_OTHER): Payer: 59 | Admitting: Psychology

## 2018-12-14 DIAGNOSIS — F331 Major depressive disorder, recurrent, moderate: Secondary | ICD-10-CM | POA: Diagnosis not present

## 2018-12-29 ENCOUNTER — Ambulatory Visit (INDEPENDENT_AMBULATORY_CARE_PROVIDER_SITE_OTHER): Payer: 59 | Admitting: Psychology

## 2018-12-29 DIAGNOSIS — F411 Generalized anxiety disorder: Secondary | ICD-10-CM | POA: Diagnosis not present

## 2018-12-29 DIAGNOSIS — F331 Major depressive disorder, recurrent, moderate: Secondary | ICD-10-CM | POA: Diagnosis not present

## 2019-01-12 ENCOUNTER — Ambulatory Visit (INDEPENDENT_AMBULATORY_CARE_PROVIDER_SITE_OTHER): Payer: 59 | Admitting: Psychology

## 2019-01-12 DIAGNOSIS — F331 Major depressive disorder, recurrent, moderate: Secondary | ICD-10-CM | POA: Diagnosis not present

## 2019-01-26 ENCOUNTER — Ambulatory Visit: Payer: 59 | Admitting: Psychology

## 2019-01-27 ENCOUNTER — Ambulatory Visit (INDEPENDENT_AMBULATORY_CARE_PROVIDER_SITE_OTHER): Payer: 59 | Admitting: Psychology

## 2019-01-27 DIAGNOSIS — F411 Generalized anxiety disorder: Secondary | ICD-10-CM | POA: Diagnosis not present

## 2019-01-27 DIAGNOSIS — F331 Major depressive disorder, recurrent, moderate: Secondary | ICD-10-CM

## 2019-02-08 ENCOUNTER — Ambulatory Visit (INDEPENDENT_AMBULATORY_CARE_PROVIDER_SITE_OTHER): Payer: 59 | Admitting: Psychology

## 2019-02-08 DIAGNOSIS — F331 Major depressive disorder, recurrent, moderate: Secondary | ICD-10-CM | POA: Diagnosis not present

## 2019-02-21 ENCOUNTER — Other Ambulatory Visit: Payer: Self-pay | Admitting: Physician Assistant

## 2019-02-23 ENCOUNTER — Ambulatory Visit (INDEPENDENT_AMBULATORY_CARE_PROVIDER_SITE_OTHER): Payer: 59 | Admitting: Psychology

## 2019-02-23 DIAGNOSIS — F331 Major depressive disorder, recurrent, moderate: Secondary | ICD-10-CM

## 2019-03-08 ENCOUNTER — Ambulatory Visit (INDEPENDENT_AMBULATORY_CARE_PROVIDER_SITE_OTHER): Payer: 59 | Admitting: Psychology

## 2019-03-08 DIAGNOSIS — F331 Major depressive disorder, recurrent, moderate: Secondary | ICD-10-CM | POA: Diagnosis not present

## 2019-03-23 ENCOUNTER — Ambulatory Visit (INDEPENDENT_AMBULATORY_CARE_PROVIDER_SITE_OTHER): Payer: 59 | Admitting: Psychology

## 2019-03-23 DIAGNOSIS — F331 Major depressive disorder, recurrent, moderate: Secondary | ICD-10-CM | POA: Diagnosis not present

## 2019-04-05 ENCOUNTER — Ambulatory Visit (INDEPENDENT_AMBULATORY_CARE_PROVIDER_SITE_OTHER): Payer: 59 | Admitting: Psychology

## 2019-04-05 DIAGNOSIS — F331 Major depressive disorder, recurrent, moderate: Secondary | ICD-10-CM

## 2019-04-11 ENCOUNTER — Other Ambulatory Visit: Payer: Self-pay | Admitting: Physician Assistant

## 2019-04-20 ENCOUNTER — Ambulatory Visit: Payer: 59 | Admitting: Psychology

## 2019-05-02 ENCOUNTER — Ambulatory Visit (INDEPENDENT_AMBULATORY_CARE_PROVIDER_SITE_OTHER): Payer: 59 | Admitting: Psychology

## 2019-05-02 DIAGNOSIS — F331 Major depressive disorder, recurrent, moderate: Secondary | ICD-10-CM | POA: Diagnosis not present

## 2019-05-11 ENCOUNTER — Other Ambulatory Visit: Payer: Self-pay | Admitting: Physician Assistant

## 2019-05-25 ENCOUNTER — Other Ambulatory Visit: Payer: Self-pay | Admitting: Physician Assistant

## 2019-05-25 NOTE — Telephone Encounter (Signed)
Pt requesting refill

## 2019-05-26 ENCOUNTER — Ambulatory Visit: Payer: Self-pay | Admitting: Psychology

## 2019-05-27 ENCOUNTER — Ambulatory Visit (INDEPENDENT_AMBULATORY_CARE_PROVIDER_SITE_OTHER): Payer: Self-pay | Admitting: Physician Assistant

## 2019-05-27 ENCOUNTER — Encounter: Payer: Self-pay | Admitting: Physician Assistant

## 2019-05-27 DIAGNOSIS — F320 Major depressive disorder, single episode, mild: Secondary | ICD-10-CM

## 2019-05-27 DIAGNOSIS — I10 Essential (primary) hypertension: Secondary | ICD-10-CM

## 2019-05-27 DIAGNOSIS — A6009 Herpesviral infection of other urogenital tract: Secondary | ICD-10-CM

## 2019-05-27 DIAGNOSIS — G4700 Insomnia, unspecified: Secondary | ICD-10-CM

## 2019-05-27 MED ORDER — CLONAZEPAM 0.5 MG PO TABS: 0.5000 mg | ORAL_TABLET | Freq: Two times a day (BID) | ORAL | 2 refills | Status: DC | PRN

## 2019-05-27 MED ORDER — HYDROCHLOROTHIAZIDE 25 MG PO TABS: 25.0000 mg | ORAL_TABLET | Freq: Every day | ORAL | 1 refills | Status: DC

## 2019-05-27 MED ORDER — ESCITALOPRAM OXALATE 10 MG PO TABS: 10.0000 mg | ORAL_TABLET | Freq: Every day | ORAL | 1 refills | Status: DC

## 2019-05-27 MED ORDER — VALACYCLOVIR HCL 500 MG PO TABS: 500.0000 mg | ORAL_TABLET | Freq: Every day | ORAL | 1 refills | Status: DC

## 2019-05-27 NOTE — Progress Notes (Signed)
Virtual Visit via Video   I connected with Shirley Nunez on 05/27/19 at  7:40 AM EST by a video enabled telemedicine application and verified that I am speaking with the correct person using two identifiers. Location patient: Home Location provider: [facility], Office Persons participating in the virtual visit: Shirley Nunez, Shirley Nunez, Shirley Pickler, LPN   I discussed the limitations of evaluation and management by telemedicine and the availability of in person appointments. The patient expressed understanding and agreed to proceed.  I acted as a Education administrator for [employer], Nunez [employer], LPN  Subjective:   HPI:   Depression/Inosmnia Pt following up today for depression. Currently taking Klonopin 0.5 mg BID prn and Lexapro 10 mg daily. She is doing really well right now. Divorce is officially finalized. She is planning to start a business, going to become a Economist. Currently having trouble sleeping.   HTN Currently taking HCTZ 25 mg. At home blood pressure readings are: under control per patient. Patient denies chest pain, SOB, blurred vision, dizziness, unusual headaches, lower leg swelling. Patient is compliant with medication. Denies excessive caffeine intake, stimulant usage, excessive alcohol intake, or increase in salt consumption.  BP Readings from Last 3 Encounters:  07/08/18 112/80  05/04/18 120/76  02/25/18 126/88    Herpes simplex of female genitalia Breakouts mostly triggered by stress. When on suppressive therapy she doesn't have breakouts, due for refill today.    ROS: See pertinent positives and negatives per HPI.  Patient Active Problem List   Diagnosis Date Noted  . Depression, major, single episode, mild ([place]) 05/04/2018  . Insomnia 05/04/2018  . Migraine 07/16/2017  . Joint stiffness 11/09/2015  . Herpes simplex of female genitalia 08/16/2014  . Essential hypertension 07/12/2014  . Sickle cell trait ([place]) 07/12/2014      Social History   Tobacco Use  . Smoking status: Never Smoker  . Smokeless tobacco: Never Used  Substance Use Topics  . Alcohol use: Yes    Alcohol/week: 2.0 standard drinks    Types: 2 Glasses of wine per week    Current Outpatient Medications:  .  clonazePAM (KLONOPIN) 0.5 MG tablet, Take 1 tablet (0.5 mg total) by mouth 2 (two) times daily as needed. for anxiety, Disp: 60 tablet, Rfl: 2 .  escitalopram (LEXAPRO) 10 MG tablet, Take 1 tablet (10 mg total) by mouth daily., Disp: 90 tablet, Rfl: 1 .  hydrochlorothiazide (HYDRODIURIL) 25 MG tablet, Take 1 tablet (25 mg total) by mouth daily., Disp: 90 tablet, Rfl: 1 .  hydrocortisone 2.5 % cream, APPLY A SMALL AMOUNT TO AFFECTED AREA TWICE DAILY FOR 1 WEEK ON 1 WEEK OFF, Disp: , Rfl:  .  metroNIDAZOLE (METROCREAM) 0.75 % cream, APPLY A SMALL AMOUNT TOPICALLY TO AFFECTED AREA TWICE DAILY, Disp: , Rfl:  .  Multiple Vitamin (MULTIVITAMIN) tablet, Take 1 tablet by mouth daily., Disp: , Rfl:  .  valACYclovir (VALTREX) 500 MG tablet, Take 1 tablet (500 mg total) by mouth daily., Disp: 90 tablet, Rfl: 1  Allergies  Allergen Reactions  . Mushroom Extract Complex Rash  . Penicillins Rash    Objective:   VITALS: Per patient if applicable, see vitals. GENERAL: Alert, appears well and in no acute distress. HEENT: Atraumatic, conjunctiva clear, no obvious abnormalities on inspection of external nose and ears. NECK: Normal movements of the head and neck. CARDIOPULMONARY: No increased WOB. Speaking in clear sentences. I:E ratio WNL.  MS: Moves all visible extremities without noticeable abnormality. PSYCH:  Pleasant and cooperative, well-groomed. Speech normal rate and rhythm. Affect is appropriate. Insight and judgement are appropriate. Attention is focused, linear, and appropriate.  NEURO: CN grossly intact. Oriented as arrived to appointment on time with no prompting. Moves both UE equally.  SKIN: No obvious lesions, wounds, erythema, or  cyanosis noted on face or hands.  Assessment and Plan:   Shirley Nunez was seen today for depression.  Diagnoses and all orders for this visit:  Insomnia, unspecified type Has tried multiple medications in the past but currently klonopin is working well for her. She understands the risks of medication and that it is a controlled substance. [place] Controlled Substance Database reviewed today regarding patient. Patient is compliant with [facility] regarding pharmacy use and one-prescribing provider. It is appropriate to continue current medication regimen.   Depression, major, single episode, mild ([place]) Denies SI/HI. Doing well on Lexapro 10 mg daily. Continue.  Herpes simplex of female genitalia No outbreaks, continue suppressive Valtrex 500 mg daily.  Essential hypertension Normotensive per patient. Refill HCTZ 25 mg daily.   ^^Follow-up for all above issues in 6 months, sooner if concerns.  Other orders -     escitalopram (LEXAPRO) 10 MG tablet; Take 1 tablet (10 mg total) by mouth daily. -     hydrochlorothiazide (HYDRODIURIL) 25 MG tablet; Take 1 tablet (25 mg total) by mouth daily. -     clonazePAM (KLONOPIN) 0.5 MG tablet; Take 1 tablet (0.5 mg total) by mouth 2 (two) times daily as needed. for anxiety -     valACYclovir (VALTREX) 500 MG tablet; Take 1 tablet (500 mg total) by mouth daily.   . Reviewed expectations re: course of current medical issues. . Discussed self-management of symptoms. . Outlined signs and symptoms indicating need for more acute intervention. . Patient verbalized understanding and all questions were answered. Marland Kitchen Health Maintenance issues including appropriate healthy diet, exercise, and smoking avoidance were discussed with patient. . See orders for this visit as documented in the electronic medical record.  I discussed the assessment and treatment plan with the patient. The patient was provided an opportunity to ask questions and all were answered. The patient agreed  with the plan and demonstrated an understanding of the instructions.   The patient was advised to call back or seek an in-person evaluation if the symptoms worsen or if the condition fails to improve as anticipated.   CMA or LPN served as scribe during this visit. History, Physical, and Plan performed by medical provider. The above documentation has been reviewed and is accurate and complete.   [place], [place] 05/27/2019

## 2019-06-23 ENCOUNTER — Ambulatory Visit (INDEPENDENT_AMBULATORY_CARE_PROVIDER_SITE_OTHER): Payer: Self-pay | Admitting: Psychology

## 2019-06-23 DIAGNOSIS — F331 Major depressive disorder, recurrent, moderate: Secondary | ICD-10-CM

## 2019-07-06 ENCOUNTER — Ambulatory Visit: Payer: Self-pay | Admitting: Psychology

## 2019-07-06 DIAGNOSIS — M9905 Segmental and somatic dysfunction of pelvic region: Secondary | ICD-10-CM | POA: Diagnosis not present

## 2019-07-06 DIAGNOSIS — M9904 Segmental and somatic dysfunction of sacral region: Secondary | ICD-10-CM | POA: Diagnosis not present

## 2019-07-06 DIAGNOSIS — M545 Low back pain: Secondary | ICD-10-CM | POA: Diagnosis not present

## 2019-07-06 DIAGNOSIS — M9903 Segmental and somatic dysfunction of lumbar region: Secondary | ICD-10-CM | POA: Diagnosis not present

## 2019-07-13 ENCOUNTER — Ambulatory Visit (INDEPENDENT_AMBULATORY_CARE_PROVIDER_SITE_OTHER): Payer: Self-pay | Admitting: Psychology

## 2019-07-13 DIAGNOSIS — F331 Major depressive disorder, recurrent, moderate: Secondary | ICD-10-CM

## 2019-07-20 DIAGNOSIS — M9904 Segmental and somatic dysfunction of sacral region: Secondary | ICD-10-CM | POA: Diagnosis not present

## 2019-07-20 DIAGNOSIS — M9903 Segmental and somatic dysfunction of lumbar region: Secondary | ICD-10-CM | POA: Diagnosis not present

## 2019-07-20 DIAGNOSIS — M545 Low back pain: Secondary | ICD-10-CM | POA: Diagnosis not present

## 2019-07-20 DIAGNOSIS — M9905 Segmental and somatic dysfunction of pelvic region: Secondary | ICD-10-CM | POA: Diagnosis not present

## 2019-07-27 ENCOUNTER — Ambulatory Visit (INDEPENDENT_AMBULATORY_CARE_PROVIDER_SITE_OTHER): Payer: Self-pay | Admitting: Psychology

## 2019-07-27 DIAGNOSIS — M9904 Segmental and somatic dysfunction of sacral region: Secondary | ICD-10-CM | POA: Diagnosis not present

## 2019-07-27 DIAGNOSIS — F331 Major depressive disorder, recurrent, moderate: Secondary | ICD-10-CM

## 2019-07-27 DIAGNOSIS — M9905 Segmental and somatic dysfunction of pelvic region: Secondary | ICD-10-CM | POA: Diagnosis not present

## 2019-07-27 DIAGNOSIS — M545 Low back pain: Secondary | ICD-10-CM | POA: Diagnosis not present

## 2019-07-27 DIAGNOSIS — M9903 Segmental and somatic dysfunction of lumbar region: Secondary | ICD-10-CM | POA: Diagnosis not present

## 2019-08-17 ENCOUNTER — Ambulatory Visit (INDEPENDENT_AMBULATORY_CARE_PROVIDER_SITE_OTHER): Payer: Self-pay | Admitting: Psychology

## 2019-08-17 DIAGNOSIS — F331 Major depressive disorder, recurrent, moderate: Secondary | ICD-10-CM

## 2019-09-08 ENCOUNTER — Ambulatory Visit: Payer: Self-pay | Admitting: Psychology

## 2019-09-14 ENCOUNTER — Other Ambulatory Visit: Payer: Self-pay

## 2019-09-14 ENCOUNTER — Ambulatory Visit (INDEPENDENT_AMBULATORY_CARE_PROVIDER_SITE_OTHER): Payer: BC Managed Care – PPO | Admitting: Physician Assistant

## 2019-09-14 ENCOUNTER — Encounter: Payer: Self-pay | Admitting: Physician Assistant

## 2019-09-14 ENCOUNTER — Other Ambulatory Visit (HOSPITAL_COMMUNITY)
Admission: RE | Admit: 2019-09-14 | Discharge: 2019-09-14 | Disposition: A | Discharge status: Home / Self Care | Payer: BC Managed Care – PPO | Source: Ambulatory Visit | Attending: Physician Assistant | Admitting: Physician Assistant

## 2019-09-14 VITALS — BP 118/82 | HR 84 | Temp 98.2°F | Ht 65.0 in | Wt 139.6 lb

## 2019-09-14 DIAGNOSIS — N760 Acute vaginitis: Secondary | ICD-10-CM

## 2019-09-14 DIAGNOSIS — Z136 Encounter for screening for cardiovascular disorders: Secondary | ICD-10-CM | POA: Diagnosis not present

## 2019-09-14 DIAGNOSIS — Z124 Encounter for screening for malignant neoplasm of cervix: Secondary | ICD-10-CM | POA: Insufficient documentation

## 2019-09-14 DIAGNOSIS — Z0001 Encounter for general adult medical examination with abnormal findings: Secondary | ICD-10-CM

## 2019-09-14 DIAGNOSIS — Z0000 Encounter for general adult medical examination without abnormal findings: Secondary | ICD-10-CM

## 2019-09-14 DIAGNOSIS — I10 Essential (primary) hypertension: Secondary | ICD-10-CM

## 2019-09-14 DIAGNOSIS — Z13220 Encounter for screening for lipoid disorders: Secondary | ICD-10-CM

## 2019-09-14 LAB — CBC WITH DIFFERENTIAL/PLATELET
Basophils Absolute: 0 10*3/uL (ref 0.0–0.1)
Basophils Relative: 0.5 % (ref 0.0–3.0)
Eosinophils Absolute: 0.1 10*3/uL (ref 0.0–0.7)
Eosinophils Relative: 1.4 % (ref 0.0–5.0)
HCT: 36.4 % (ref 36.0–46.0)
Hemoglobin: 12.2 g/dL (ref 12.0–15.0)
Lymphocytes Relative: 24.8 % (ref 12.0–46.0)
Lymphs Abs: 1.7 10*3/uL (ref 0.7–4.0)
MCHC: 33.6 g/dL (ref 30.0–36.0)
MCV: 101.3 fl — ABNORMAL HIGH (ref 78.0–100.0)
Monocytes Absolute: 0.7 10*3/uL (ref 0.1–1.0)
Monocytes Relative: 9.3 % (ref 3.0–12.0)
Neutro Abs: 4.5 10*3/uL (ref 1.4–7.7)
Neutrophils Relative %: 64 % (ref 43.0–77.0)
Platelets: 330 10*3/uL (ref 150.0–400.0)
RBC: 3.59 Mil/uL — ABNORMAL LOW (ref 3.87–5.11)
RDW: 12 % (ref 11.5–15.5)
WBC: 7 10*3/uL (ref 4.0–10.5)

## 2019-09-14 LAB — LIPID PANEL
Cholesterol: 189 mg/dL (ref 0–200)
HDL: 86.3 mg/dL (ref >39.00)
LDL Cholesterol: 94 mg/dL (ref 0–99)
NonHDL: 102.8
Total CHOL/HDL Ratio: 2
Triglycerides: 45 mg/dL (ref 0.0–149.0)
VLDL: 9 mg/dL (ref 0.0–40.0)

## 2019-09-14 LAB — COMPREHENSIVE METABOLIC PANEL
ALT: 11 U/L (ref 0–35)
AST: 14 U/L (ref 0–37)
Albumin: 4.5 g/dL (ref 3.5–5.2)
Alkaline Phosphatase: 49 U/L (ref 39–117)
BUN: 16 mg/dL (ref 6–23)
CO2: 35 mEq/L — ABNORMAL HIGH (ref 19–32)
Calcium: 9.8 mg/dL (ref 8.4–10.5)
Chloride: 98 mEq/L (ref 96–112)
Creatinine, Ser: 0.74 mg/dL (ref 0.40–1.20)
GFR: 102.64 mL/min (ref >60.00)
Glucose, Bld: 89 mg/dL (ref 70–99)
Potassium: 3.1 mEq/L — ABNORMAL LOW (ref 3.5–5.1)
Sodium: 139 mEq/L (ref 135–145)
Total Bilirubin: 0.6 mg/dL (ref 0.2–1.2)
Total Protein: 7.8 g/dL (ref 6.0–8.3)

## 2019-09-14 MED ORDER — FLUCONAZOLE 150 MG PO TABS
150.0000 mg | ORAL_TABLET | Freq: Once | ORAL | 0 refills | Status: AC
Start: 2019-09-14 — End: 2019-09-14

## 2019-09-14 MED ORDER — METRONIDAZOLE 500 MG PO TABS: 500.0000 mg | ORAL_TABLET | Freq: Two times a day (BID) | ORAL | 0 refills | Status: AC

## 2019-09-14 MED ORDER — IPRATROPIUM BROMIDE 0.03 % NA SOLN: 2.0000 | Freq: Two times a day (BID) | NASAL | 12 refills | Status: DC

## 2019-09-14 MED ORDER — HYDROCHLOROTHIAZIDE 25 MG PO TABS: 25.0000 mg | ORAL_TABLET | Freq: Every day | ORAL | 1 refills | Status: DC

## 2019-09-14 MED ORDER — VALACYCLOVIR HCL 500 MG PO TABS: 500.0000 mg | ORAL_TABLET | Freq: Every day | ORAL | 1 refills | Status: DC

## 2019-09-14 NOTE — Patient Instructions (Signed)
It was great to see you!  Please go to the lab for blood work.   I have sent in refills of meds as well as new nasal spray for you to trial.  Our office will call you with your results unless you have chosen to receive results via MyChart.  If your blood work is normal we will follow-up each year for physicals and as scheduled for chronic medical problems.  If anything is abnormal we will treat accordingly and get you in for a follow-up.  Take care,  [facility] Maintenance, Female Adopting a healthy lifestyle and getting preventive care are important in promoting health and wellness. Ask your health care provider about:  The right schedule for you to have regular tests and exams.  Things you can do on your own to prevent diseases and keep yourself healthy. What should I know about diet, weight, and exercise? Eat a healthy diet   Eat a diet that includes plenty of vegetables, fruits, low-fat dairy products, and lean protein.  Do not eat a lot of foods that are high in solid fats, added sugars, or sodium. Maintain a healthy weight Body mass index (BMI) is used to identify weight problems. It estimates body fat based on height and weight. Your health care provider can help determine your BMI and help you achieve or maintain a healthy weight. Get regular exercise Get regular exercise. This is one of the most important things you can do for your health. Most adults should:  Exercise for at least 150 minutes each week. The exercise should increase your heart rate and make you sweat (moderate-intensity exercise).  Do strengthening exercises at least twice a week. This is in addition to the moderate-intensity exercise.  Spend less time sitting. Even light physical activity can be beneficial. Watch cholesterol and blood lipids Have your blood tested for lipids and cholesterol at 45 years of age, then have this test every 5 years. Have your cholesterol levels checked more  often if:  Your lipid or cholesterol levels are high.  You are older than 45 years of age.  You are at high risk for heart disease. What should I know about cancer screening? Depending on your health history and family history, you may need to have cancer screening at various ages. This may include screening for:  Breast cancer.  Cervical cancer.  Colorectal cancer.  Skin cancer.  Lung cancer. What should I know about heart disease, diabetes, and high blood pressure? Blood pressure and heart disease  High blood pressure causes heart disease and increases the risk of stroke. This is more likely to develop in people who have high blood pressure readings, are of African descent, or are overweight.  Have your blood pressure checked: ? Every 3-5 years if you are 60-13 years of age. ? Every year if you are 4 years old or older. Diabetes Have regular diabetes screenings. This checks your fasting blood sugar level. Have the screening done:  Once every three years after age 20 if you are at a normal weight and have a low risk for diabetes.  More often and at a younger age if you are overweight or have a high risk for diabetes. What should I know about preventing infection? Hepatitis B If you have a higher risk for hepatitis B, you should be screened for this virus. Talk with your health care provider to find out if you are at risk for hepatitis B infection. Hepatitis C Testing is recommended  for:  Everyone born from [birth year] through 1966.  Anyone with known risk factors for hepatitis C. Sexually transmitted infections (STIs)  Get screened for STIs, including gonorrhea and chlamydia, if: ? You are sexually active and are younger than 45 years of age. ? You are older than 45 years of age and your health care provider tells you that you are at risk for this type of infection. ? Your sexual activity has changed since you were last screened, and you are at increased risk for chlamydia  or gonorrhea. Ask your health care provider if you are at risk.  Ask your health care provider about whether you are at high risk for HIV. Your health care provider may recommend a prescription medicine to help prevent HIV infection. If you choose to take medicine to prevent HIV, you should first get tested for HIV. You should then be tested every 3 months for as long as you are taking the medicine. Pregnancy  If you are about to stop having your period (premenopausal) and you may become pregnant, seek counseling before you get pregnant.  Take 400 to 800 micrograms (mcg) of folic acid every day if you become pregnant.  Ask for birth control (contraception) if you want to prevent pregnancy. Osteoporosis and menopause Osteoporosis is a disease in which the bones lose minerals and strength with aging. This can result in bone fractures. If you are 23 years old or older, or if you are at risk for osteoporosis and fractures, ask your health care provider if you should:  Be screened for bone loss.  Take a calcium or vitamin D supplement to lower your risk of fractures.  Be given hormone replacement therapy (HRT) to treat symptoms of menopause. Follow these instructions at home: Lifestyle  Do not use any products that contain nicotine or tobacco, such as cigarettes, e-cigarettes, and chewing tobacco. If you need help quitting, ask your health care provider.  Do not use street drugs.  Do not share needles.  Ask your health care provider for help if you need support or information about quitting drugs. Alcohol use  Do not drink alcohol if: ? Your health care provider tells you not to drink. ? You are pregnant, may be pregnant, or are planning to become pregnant.  If you drink alcohol: ? Limit how much you use to 0-1 drink a day. ? Limit intake if you are breastfeeding.  Be aware of how much alcohol is in your drink. In the [place] one drink equals one 12 oz bottle of beer (355 mL), one 5 oz  glass of wine (148 mL), or one 1 oz glass of hard liquor (44 mL). General instructions  Schedule regular health, dental, and eye exams.  Stay current with your vaccines.  Tell your health care provider if: ? You often feel depressed. ? You have ever been abused or do not feel safe at home. Summary  Adopting a healthy lifestyle and getting preventive care are important in promoting health and wellness.  Follow your health care provider's instructions about healthy diet, exercising, and getting tested or screened for diseases.  Follow your health care provider's instructions on monitoring your cholesterol and blood pressure. This information is not intended to replace advice given to you by your health care provider. Make sure you discuss any questions you have with your health care provider. Document Revised: 04/21/2018 Document Reviewed: 04/21/2018 Elsevier Patient Education  2020 [employer].

## 2019-09-14 NOTE — Progress Notes (Signed)
I acted as a Education administrator for [employer], PA-C [place], [place]   Subjective:    Shirley Nunez is a 44 y.o. female and is here for a comprehensive physical exam with pap.   HPI   Acute issues Allergies -has seasonal allergies and will take antihistamine and use Flonase.  Feels like her throat continues to be raspy despite treatment.  Denies sore throat. Vaginitis --recently started having increased sex with partner.  She is having some slight discharge and odor.  She has a history of BV.  Chronic issues HTN -- Currently taking HCTZ 25 mg. At home blood pressure readings are: normal per patient. Patient denies chest pain, SOB, blurred vision, dizziness, unusual headaches, lower leg swelling. Patient is compliant with medication. Denies excessive caffeine intake, stimulant usage, excessive alcohol intake, or increase in salt consumption.  BP Readings from Last 3 Encounters:  09/14/19 118/82  07/08/18 112/80  05/04/18 120/76   Anxiety - Stopped taking Lexapro and Klonopin herself. Doing really well. Sleeping great. Still seeing Lattie Haw.  Healthy relationship.    Health Maintenance Due  Topic Date Due  . PAP SMEAR-Modifier  06/26/2019    Health Maintenance: Immunizations --Up to date Colonoscopy -- n/a Mammogram -- overdue for mammogram PAP -- will get today Diet -- variable Sleep habits -- good sleep Exercise -- dog walking Weight -- Weight: 139 lb 9.6 oz (63.3 kg)  Mood -- much better Weight history: Wt Readings from Last 10 Encounters:  09/14/19 139 lb 9.6 oz (63.3 kg)  11/09/18 130 lb (59 kg)  07/08/18 130 lb (59 kg)  05/04/18 131 lb (59.4 kg)  02/25/18 138 lb 8 oz (62.8 kg)  01/14/18 142 lb 6.4 oz (64.6 kg)  12/17/17 146 lb 3.2 oz (66.3 kg)  07/16/17 146 lb (66.2 kg)  07/10/16 142 lb 6.4 oz (64.6 kg)  11/07/15 135 lb (61.2 kg)   No LMP recorded. Patient has had an ablation.  Alcohol use: 1-2 times a week; cocktail occasionally Tobacco use: never  smoked  Depression screen [facility] 2/9 09/14/2019  Decreased Interest 0  Down, Depressed, Hopeless 0  PHQ - 2 Score 0  Altered sleeping 0  Tired, decreased energy 0  Change in appetite 0  Feeling bad or failure about yourself  0  Trouble concentrating 0  Moving slowly or fidgety/restless 0  Suicidal thoughts 0  PHQ-9 Score 0  Difficult doing work/chores Not difficult at all     Other providers/specialists: Patient Care Team: Inda Coke, [place] as PCP - General (Physician Assistant)    PMHx, SurgHx, SocialHx, Medications, and Allergies were reviewed in the Visit Navigator and updated as appropriate.   Past Medical History:  Diagnosis Date  . Anxiety   . Depression   . HSV infection    on daily medication  . Migraine   . Sickle cell anemia (HCC)    Sickle cell trait     Past Surgical History:  Procedure Laterality Date  . WISDOM TOOTH EXTRACTION       Family History  Problem Relation Age of Onset  . Hypertension Mother   . Depression Mother   . Miscarriages / [place] Mother   . Hypertension Father   . Hyperlipidemia Father   . Diabetes Father   . Arthritis Father   . Stroke Father   . Sickle cell anemia Sister   . Alcohol abuse Maternal Grandmother   . Arthritis Maternal Grandmother   . Depression Maternal Grandmother   . Diabetes Maternal Grandmother   .  Heart attack Maternal Grandmother   . Hypertension Maternal Grandmother   . Stroke Maternal Grandmother   . Heart attack Maternal Grandfather   . Lung cancer Paternal Grandmother        mets to brain  . Alcohol abuse Paternal Grandfather   . Heart attack Paternal Grandfather   . Hypertension Paternal Grandfather   . Stroke Paternal Grandfather   . Colon cancer Neg Hx     Social History   Tobacco Use  . Smoking status: Never Smoker  . Smokeless tobacco: Never Used  Substance Use Topics  . Alcohol use: Yes    Alcohol/week: 2.0 standard drinks    Types: 2 Glasses of wine per week  . Drug use:  No    Review of Systems:   Review of Systems  Constitutional: Negative for chills, fever, malaise/fatigue and weight loss.  HENT: Negative for hearing loss, sinus pain and sore throat.   Respiratory: Negative for cough and hemoptysis.   Cardiovascular: Negative for chest pain, palpitations, leg swelling and PND.  Gastrointestinal: Negative for abdominal pain, constipation, diarrhea, heartburn, nausea and vomiting.  Genitourinary: Negative for dysuria, frequency and urgency.  Musculoskeletal: Negative for back pain, myalgias and neck pain.  Skin: Negative for itching and rash.  Neurological: Negative for dizziness, tingling, seizures and headaches.  Endo/Heme/Allergies: Negative for polydipsia.  Psychiatric/Behavioral: Negative for depression. The patient is not nervous/anxious.     Objective:   BP 118/82 (BP Location: Right Arm, Patient Position: Sitting, Cuff Size: Normal)   Pulse 84   Temp 98.2 F (36.8 C) (Temporal)   Ht 5\' 5"  (1.651 m)   Wt 139 lb 9.6 oz (63.3 kg)   SpO2 99%   BMI 23.23 kg/m  Body mass index is 23.23 kg/m.   General Appearance:    Alert, cooperative, no distress, appears stated age  Head:    Normocephalic, without obvious abnormality, atraumatic  Eyes:    PERRL, conjunctiva/corneas clear, EOM's intact, fundi    benign, both eyes  Ears:    Normal TM's and external ear canals, both ears  Nose:   Nares normal, septum midline, mucosa normal, no drainage    or sinus tenderness  Throat:   Lips, mucosa, and tongue normal; teeth and gums normal  Neck:   Supple, symmetrical, trachea midline, no adenopathy;    thyroid:  no enlargement/tenderness/nodules; no carotid   bruit or JVD  Back:     Symmetric, no curvature, ROM normal, no CVA tenderness  Lungs:     Clear to auscultation bilaterally, respirations unlabored  Chest Wall:    No tenderness or deformity   Heart:    Regular rate and rhythm, S1 and S2 normal, no murmur, rub or gallop  Breast Exam:   Deferred   Abdomen:     Soft, non-tender, bowel sounds active all four quadrants,    no masses, no organomegaly  Genitalia:    Normal female without lesion, discharge or tenderness  Extremities:   Extremities normal, atraumatic, no cyanosis or edema  Pulses:   2+ and symmetric all extremities  Skin:   Skin color, texture, turgor normal, no rashes or lesions  Lymph nodes:   Cervical, supraclavicular, and axillary nodes normal  Neurologic:   CNII-XII intact, normal strength, sensation and reflexes    throughout    Assessment/Plan:   Shirley Nunez was seen today for annual exam and hypertension.  Diagnoses and all orders for this visit:  Encounter for general adult medical examination with abnormal findings Today patient  counseled on age appropriate routine health concerns for screening and prevention, each reviewed and up to date or declined. Immunizations reviewed and up to date or declined. Labs ordered and reviewed. Risk factors for depression reviewed and negative. Hearing function and visual acuity are intact. ADLs screened and addressed as needed. Functional ability and level of safety reviewed and appropriate. Education, counseling and referrals performed based on assessed risks today. Patient provided with a copy of personalized plan for preventive services.  Essential hypertension Currently well controlled when she is not eating salty foods.  Continue hydrochlorothiazide 25 mg daily. -     CBC w/Diff -     Comprehensive metabolic panel  Acute vaginitis I have sent off a swab for STD testing as well as to test for BV and yeast.  I did go ahead and empirically prescribe her Flagyl and Diflucan as she is going out of town.  She will wait to start treatment based upon swab results. -     Cervicovaginal ancillary only( [facility])  Pap smear for cervical cancer screening -     Cytology - PAP( [facility])  Anxiety  Improved, declines any need for intervention.  Continue to monitor.  Encounter  for lipid screening for cardiovascular disease -     Lipid panel  Other orders -     ipratropium (ATROVENT) 0.03 % nasal spray; Place 2 sprays into both nostrils every 12 (twelve) hours. -     hydrochlorothiazide (HYDRODIURIL) 25 MG tablet; Take 1 tablet (25 mg total) by mouth daily. -     valACYclovir (VALTREX) 500 MG tablet; Take 1 tablet (500 mg total) by mouth daily.    Well Adult Exam: Labs ordered: Yes. Patient counseling was done. See below for items discussed. Discussed the patient's BMI.  The BMI is in the acceptable range Follow up in one year. Breast cancer screening: counseled. Cervical cancer screening: performed today   Patient Counseling: [x]    Nutrition: Stressed importance of moderation in sodium/caffeine intake, saturated fat and cholesterol, caloric balance, sufficient intake of fresh fruits, vegetables, fiber, calcium, iron, and 1 mg of folate supplement per day (for females capable of pregnancy).  [x]    Stressed the importance of regular exercise.   [x]    Substance Abuse: Discussed cessation/primary prevention of tobacco, alcohol, or other drug use; driving or other dangerous activities under the influence; availability of treatment for abuse.   [x]    Injury prevention: Discussed safety belts, safety helmets, smoke detector, smoking near bedding or upholstery.   [x]    Sexuality: Discussed sexually transmitted diseases, partner selection, use of condoms, avoidance of unintended pregnancy  and contraceptive alternatives.  [x]    Dental health: Discussed importance of regular tooth brushing, flossing, and dental visits.  [x]    Health maintenance and immunizations reviewed. Please refer to Health maintenance section.   CMA or LPN served as scribe during this visit. History, Physical, and Plan performed by medical provider. The above documentation has been reviewed and is accurate and complete.   Inda Coke, PA-C [place]

## 2019-09-15 LAB — CYTOLOGY - PAP
Adequacy: ABSENT
Comment: NEGATIVE
Diagnosis: NEGATIVE
High risk HPV: NEGATIVE

## 2019-09-15 LAB — CERVICOVAGINAL ANCILLARY ONLY
Bacterial Vaginitis (gardnerella): POSITIVE — AB
Candida Glabrata: NEGATIVE
Candida Vaginitis: NEGATIVE
Chlamydia: NEGATIVE
Comment: NEGATIVE
Comment: NEGATIVE
Comment: NEGATIVE
Comment: NEGATIVE
Comment: NEGATIVE
Comment: NORMAL
Neisseria Gonorrhea: NEGATIVE
Trichomonas: NEGATIVE

## 2019-09-16 ENCOUNTER — Other Ambulatory Visit: Payer: Self-pay | Admitting: Physician Assistant

## 2019-09-16 DIAGNOSIS — E876 Hypokalemia: Secondary | ICD-10-CM

## 2019-09-16 MED ORDER — POTASSIUM CHLORIDE ER 10 MEQ PO TBCR: 10.0000 meq | EXTENDED_RELEASE_TABLET | Freq: Two times a day (BID) | ORAL | 0 refills | Status: DC

## 2019-09-20 ENCOUNTER — Ambulatory Visit: Payer: Self-pay | Admitting: Psychology

## 2019-10-26 ENCOUNTER — Ambulatory Visit (INDEPENDENT_AMBULATORY_CARE_PROVIDER_SITE_OTHER): Payer: BC Managed Care – PPO | Admitting: Psychology

## 2019-10-26 DIAGNOSIS — F331 Major depressive disorder, recurrent, moderate: Secondary | ICD-10-CM

## 2019-11-29 ENCOUNTER — Telehealth: Payer: Self-pay | Admitting: Physician Assistant

## 2019-11-29 MED ORDER — METRONIDAZOLE 500 MG PO TABS: 500.0000 mg | ORAL_TABLET | Freq: Two times a day (BID) | ORAL | 0 refills | Status: DC

## 2019-11-29 MED ORDER — FLUCONAZOLE 150 MG PO TABS: ORAL_TABLET | ORAL | 0 refills | Status: DC

## 2019-11-29 NOTE — Telephone Encounter (Signed)
Does patient have any concerns for STDs?  If not, okay to send in diflucan and flagyl.  If so, needs appointment.

## 2019-11-29 NOTE — Telephone Encounter (Signed)
Please see message and advise 

## 2019-11-29 NOTE — Telephone Encounter (Signed)
Spoke to pt asked her if she had and concerns for STD infections? Pt said no. Told her okay I will send in Rx's to the pharmacy but if symptoms persist need to schedule an appt. Pt verbalized understanding. Rx's sent.

## 2019-11-29 NOTE — Telephone Encounter (Signed)
Spoke to pt c/o yellow/brown vaginal discharge thinks BV again would like refill on Flagyl and Diflucan like she was given in May. Told pt I will have to send message to [facility] and get back to her. Pt verbalized understanding.

## 2019-11-29 NOTE — Telephone Encounter (Signed)
Patient called and requested a call back regarding her medication. The medication she is talking about is not on her chart. She just wanted to speak with a nurse regarding it.

## 2019-11-30 ENCOUNTER — Ambulatory Visit (INDEPENDENT_AMBULATORY_CARE_PROVIDER_SITE_OTHER): Payer: BC Managed Care – PPO | Admitting: Psychology

## 2019-11-30 DIAGNOSIS — F411 Generalized anxiety disorder: Secondary | ICD-10-CM

## 2019-12-05 DIAGNOSIS — M546 Pain in thoracic spine: Secondary | ICD-10-CM | POA: Diagnosis not present

## 2019-12-05 DIAGNOSIS — M9903 Segmental and somatic dysfunction of lumbar region: Secondary | ICD-10-CM | POA: Diagnosis not present

## 2019-12-05 DIAGNOSIS — M545 Low back pain: Secondary | ICD-10-CM | POA: Diagnosis not present

## 2019-12-05 DIAGNOSIS — Z20822 Contact with and (suspected) exposure to covid-19: Secondary | ICD-10-CM | POA: Diagnosis not present

## 2019-12-05 DIAGNOSIS — M9902 Segmental and somatic dysfunction of thoracic region: Secondary | ICD-10-CM | POA: Diagnosis not present

## 2019-12-27 ENCOUNTER — Other Ambulatory Visit: Payer: Self-pay | Admitting: Physician Assistant

## 2019-12-28 ENCOUNTER — Ambulatory Visit (INDEPENDENT_AMBULATORY_CARE_PROVIDER_SITE_OTHER): Payer: BC Managed Care – PPO | Admitting: Psychology

## 2019-12-28 ENCOUNTER — Other Ambulatory Visit: Payer: Self-pay | Admitting: Physician Assistant

## 2019-12-28 ENCOUNTER — Encounter: Payer: Self-pay | Admitting: Physician Assistant

## 2019-12-28 DIAGNOSIS — F331 Major depressive disorder, recurrent, moderate: Secondary | ICD-10-CM | POA: Diagnosis not present

## 2019-12-28 DIAGNOSIS — F411 Generalized anxiety disorder: Secondary | ICD-10-CM | POA: Diagnosis not present

## 2020-01-02 ENCOUNTER — Encounter: Payer: Self-pay | Admitting: Physician Assistant

## 2020-01-02 ENCOUNTER — Telehealth (INDEPENDENT_AMBULATORY_CARE_PROVIDER_SITE_OTHER): Payer: BC Managed Care – PPO | Admitting: Physician Assistant

## 2020-01-02 VITALS — Ht 65.0 in | Wt 128.0 lb

## 2020-01-02 DIAGNOSIS — F418 Other specified anxiety disorders: Secondary | ICD-10-CM | POA: Diagnosis not present

## 2020-01-02 DIAGNOSIS — F320 Major depressive disorder, single episode, mild: Secondary | ICD-10-CM

## 2020-01-02 MED ORDER — CLONAZEPAM 0.5 MG PO TABS
0.5000 mg | ORAL_TABLET | Freq: Every day | ORAL | 0 refills | Status: DC | PRN
Start: 2020-01-02 — End: 2020-04-30

## 2020-01-02 MED ORDER — ESCITALOPRAM OXALATE 10 MG PO TABS: 10.0000 mg | ORAL_TABLET | Freq: Every day | ORAL | 1 refills | Status: DC

## 2020-01-02 NOTE — Progress Notes (Signed)
Virtual Visit via Video   I connected with Shirley Nunez on 01/02/20 at 12:00 PM EDT by a video enabled telemedicine application and verified that I am speaking with the correct person using two identifiers. Location patient: Home Location provider: [facility], Office Persons participating in the virtual visit: Trishna Z Brutus, Inda Coke PA-C, Anselmo Pickler, LPN   I discussed the limitations of evaluation and management by telemedicine and the availability of in person appointments. The patient expressed understanding and agreed to proceed.  I acted as a Education administrator for [employer], [employer], LPN   Subjective:   HPI:   Anxiety/Depression Pt c/o increase in symptoms for the past 2-3 weeks. She has recently had 3 panic attacks due to Covid exposure and been taken out of work several times in the past month and has lost several pounds.   Pt would like to restart medication and also needs for sleep. Pt has been seeing therapist once every 2 weeks.  Was prior taking Klonopin 0.5 mg BID and Lexapro 10 mg daily.  GAD 7 : Generalized Anxiety Score 01/02/2020 09/14/2019 11/09/2018 07/08/2018  Nervous, Anxious, on Edge 2 0 1 1  Control/stop worrying 3 0 1 2  Worry too much - different things 1 0 1 2  Trouble relaxing 3 0 1 3  Restless 3 0 0 1  Easily annoyed or irritable 3 0 2 1  Afraid - awful might happen 3 0 1 2  Total GAD 7 Score 18 0 7 12  Anxiety Difficulty Very difficult Not difficult at all Not difficult at all Somewhat difficult   Depression screen [facility] 2/9 01/02/2020 09/14/2019 11/09/2018 07/08/2018 05/04/2018  Decreased Interest 2 0 0 1 1  Down, Depressed, Hopeless 2 0 1 1 2   PHQ - 2 Score 4 0 1 2 3   Altered sleeping 3 0 3 3 3   Tired, decreased energy 3 0 1 2 2   Change in appetite 3 0 0 3 3  Feeling bad or failure about yourself  0 0 1 1 1   Trouble concentrating 3 0 0 1 1  Moving slowly or fidgety/restless 1 0 0 0 0  Suicidal thoughts 0 0 0 0 0  PHQ-9 Score 17  0 6 12 13   Difficult doing work/chores Very difficult Not difficult at all Not difficult at all Somewhat difficult Somewhat difficult  Some recent data might be hidden      ROS: See pertinent positives and negatives per HPI.  Patient Active Problem List   Diagnosis Date Noted  . Depression, major, single episode, mild ([place]) 05/04/2018  . Insomnia 05/04/2018  . Migraine 07/16/2017  . Joint stiffness 11/09/2015  . Herpes simplex of female genitalia 08/16/2014  . Essential hypertension 07/12/2014  . Sickle cell trait ([place]) 07/12/2014    Social History   Tobacco Use  . Smoking status: Never Smoker  . Smokeless tobacco: Never Used  Substance Use Topics  . Alcohol use: Yes    Alcohol/week: 2.0 standard drinks    Types: 2 Glasses of wine per week    Current Outpatient Medications:  .  hydrochlorothiazide (HYDRODIURIL) 25 MG tablet, Take 1 tablet by mouth once daily, Disp: 90 tablet, Rfl: 0 .  Multiple Vitamin (MULTIVITAMIN) tablet, Take 1 tablet by mouth daily., Disp: , Rfl:  .  valACYclovir (VALTREX) 500 MG tablet, Take 1 tablet (500 mg total) by mouth daily., Disp: 90 tablet, Rfl: 1 .  clonazePAM (KLONOPIN) 0.5 MG tablet, Take 1 tablet (0.5  mg total) by mouth daily as needed for anxiety., Disp: 15 tablet, Rfl: 0 .  escitalopram (LEXAPRO) 10 MG tablet, Take 1 tablet (10 mg total) by mouth daily., Disp: 90 tablet, Rfl: 1  Allergies  Allergen Reactions  . Mushroom Extract Complex Rash  . Penicillins Rash    Objective:   VITALS: Per patient if applicable, see vitals. GENERAL: Alert, appears well and in no acute distress. HEENT: Atraumatic, conjunctiva clear, no obvious abnormalities on inspection of external nose and ears. NECK: Normal movements of the head and neck. CARDIOPULMONARY: No increased WOB. Speaking in clear sentences. I:E ratio WNL.  MS: Moves all visible extremities without noticeable abnormality. PSYCH: Pleasant and cooperative, well-groomed. Speech normal rate  and rhythm. Affect is appropriate. Insight and judgement are appropriate. Attention is focused, linear, and appropriate.  NEURO: CN grossly intact. Oriented as arrived to appointment on time with no prompting. Moves both UE equally.  SKIN: No obvious lesions, wounds, erythema, or cyanosis noted on face or hands.  Assessment and Plan:   Shirley Nunez was seen today for anxiety and depression.  Diagnoses and all orders for this visit:  Depression, major, single episode, mild ([place]); Situational anxiety Uncontrolled. Restart Lexapro 10 mg daily and klonopin 0.5 mg prn. I discussed with patient that if they develop any SI, to tell someone immediately and seek medical attention. Follow-up in 3 months, sooner if concerns.  Other orders -     clonazePAM (KLONOPIN) 0.5 MG tablet; Take 1 tablet (0.5 mg total) by mouth daily as needed for anxiety. -     escitalopram (LEXAPRO) 10 MG tablet; Take 1 tablet (10 mg total) by mouth daily.  . Reviewed expectations re: course of current medical issues. . Discussed self-management of symptoms. . Outlined signs and symptoms indicating need for more acute intervention. . Patient verbalized understanding and all questions were answered. Marland Kitchen Health Maintenance issues including appropriate healthy diet, exercise, and smoking avoidance were discussed with patient. . See orders for this visit as documented in the electronic medical record.  I discussed the assessment and treatment plan with the patient. The patient was provided an opportunity to ask questions and all were answered. The patient agreed with the plan and demonstrated an understanding of the instructions.   The patient was advised to call back or seek an in-person evaluation if the symptoms worsen or if the condition fails to improve as anticipated.   CMA or LPN served as scribe during this visit. History, Physical, and Plan performed by medical provider. The above documentation has been reviewed and is  accurate and complete.  [place], [place] 01/02/2020

## 2020-01-18 ENCOUNTER — Ambulatory Visit: Payer: BC Managed Care – PPO | Admitting: Psychology

## 2020-01-31 ENCOUNTER — Other Ambulatory Visit: Payer: Self-pay | Admitting: Physician Assistant

## 2020-01-31 ENCOUNTER — Encounter: Payer: Self-pay | Admitting: Physician Assistant

## 2020-01-31 MED ORDER — METRONIDAZOLE 500 MG PO TABS: 500.0000 mg | ORAL_TABLET | Freq: Two times a day (BID) | ORAL | 0 refills | Status: DC

## 2020-01-31 MED ORDER — METRONIDAZOLE 0.75 % VA GEL: VAGINAL | 2 refills | Status: DC

## 2020-04-07 ENCOUNTER — Other Ambulatory Visit: Payer: Self-pay | Admitting: Physician Assistant

## 2020-04-26 DIAGNOSIS — R059 Cough, unspecified: Secondary | ICD-10-CM | POA: Diagnosis not present

## 2020-04-26 DIAGNOSIS — R52 Pain, unspecified: Secondary | ICD-10-CM | POA: Diagnosis not present

## 2020-04-26 DIAGNOSIS — U071 COVID-19: Secondary | ICD-10-CM | POA: Diagnosis not present

## 2020-04-30 ENCOUNTER — Other Ambulatory Visit: Payer: Self-pay

## 2020-04-30 ENCOUNTER — Encounter: Payer: Self-pay | Admitting: Physician Assistant

## 2020-04-30 ENCOUNTER — Emergency Department (HOSPITAL_COMMUNITY)
Admission: EM | Admit: 2020-04-30 | Discharge: 2020-04-30 | Discharge status: Against Medical Advice | Payer: BC Managed Care – PPO

## 2020-04-30 ENCOUNTER — Telehealth (INDEPENDENT_AMBULATORY_CARE_PROVIDER_SITE_OTHER): Payer: BC Managed Care – PPO | Admitting: Physician Assistant

## 2020-04-30 VITALS — Ht 65.0 in | Wt 135.0 lb

## 2020-04-30 DIAGNOSIS — J111 Influenza due to unidentified influenza virus with other respiratory manifestations: Secondary | ICD-10-CM | POA: Diagnosis not present

## 2020-04-30 DIAGNOSIS — U071 COVID-19: Secondary | ICD-10-CM | POA: Diagnosis not present

## 2020-04-30 MED ORDER — ALBUTEROL SULFATE (2.5 MG/3ML) 0.083% IN NEBU: 2.5000 mg | INHALATION_SOLUTION | Freq: Four times a day (QID) | RESPIRATORY_TRACT | 1 refills | Status: DC | PRN

## 2020-04-30 MED ORDER — BENZONATATE 100 MG PO CAPS: 100.0000 mg | ORAL_CAPSULE | Freq: Three times a day (TID) | ORAL | 1 refills | Status: DC | PRN

## 2020-04-30 MED ORDER — FLOVENT HFA 44 MCG/ACT IN AERO: 1.0000 | INHALATION_SPRAY | Freq: Two times a day (BID) | RESPIRATORY_TRACT | 12 refills | Status: DC

## 2020-04-30 NOTE — Progress Notes (Signed)
Virtual Visit via Video   I connected with Shirley Nunez on 04/30/20 at 12:00 PM EST by a video enabled telemedicine application and verified that I am speaking with the correct person using two identifiers. Location patient: Home Location provider: [facility], Office Persons participating in the virtual visit: Shirley Nunez, Shirley Coke PA-C, Shirley Pickler, LPN   I discussed the limitations of evaluation and management by telemedicine and the availability of in person appointments. The patient expressed understanding and agreed to proceed.  I acted as a Education administrator for [employer], PA-C [employer], LPN   Subjective:   HPI:   Patient is requesting evaluation for possible COVID-19.  Symptom onset: Monday 12/13  Travel/contacts: traveled to [place], had negative tests when she was there. Thinks she may have been exposed at work.  Vaccination status: No  Pt was diagnosed on Thursday 12/16 with COVID and Flu  Patient endorses the following symptoms: subjective fever, sinus headache, sinus congestion, sore throat, dry cough (non-productive) and myalgia, body aches.  Body aches are better today than yesterday.   Has a pulse oximeter at home currently reading 94-95%.  Patient denies the following symptoms: ear pain, wheezing, chest tightness and chest pain  Treatments tried: Tylenol, Thera Flu  Patient risk factors: Current FIEPP-29 risk of complications score: 1 Smoking status: Shirley Nunez  reports that she has never smoked. She has never used smokeless tobacco. If female, currently pregnant? []   Yes [x]   No  ROS: See pertinent positives and negatives per HPI.  Patient Active Problem List   Diagnosis Date Noted  . Depression, major, single episode, mild ([place]) 05/04/2018  . Insomnia 05/04/2018  . Migraine 07/16/2017  . Joint stiffness 11/09/2015  . Herpes simplex of female genitalia 08/16/2014  . Essential hypertension 07/12/2014  . Sickle cell trait  ([place]) 07/12/2014    Social History   Tobacco Use  . Smoking status: Never Smoker  . Smokeless tobacco: Never Used  Substance Use Topics  . Alcohol use: Yes    Alcohol/week: 2.0 standard drinks    Types: 2 Glasses of wine per week    Current Outpatient Medications:  .  hydrochlorothiazide (HYDRODIURIL) 25 MG tablet, Take 1 tablet by mouth once daily, Disp: 90 tablet, Rfl: 0 .  Multiple Vitamin (MULTIVITAMIN) tablet, Take 1 tablet by mouth daily., Disp: , Rfl:  .  valACYclovir (VALTREX) 500 MG tablet, Take 1 tablet (500 mg total) by mouth daily., Disp: 90 tablet, Rfl: 1 .  albuterol (PROVENTIL) (2.5 MG/3ML) 0.083% nebulizer solution, Take 3 mLs (2.5 mg total) by nebulization every 6 (six) hours as needed for wheezing or shortness of breath., Disp: 150 mL, Rfl: 1 .  benzonatate (TESSALON PERLES) 100 MG capsule, Take 1 capsule (100 mg total) by mouth 3 (three) times daily as needed for cough., Disp: 30 capsule, Rfl: 1 .  fluticasone (FLOVENT HFA) 44 MCG/ACT inhaler, Inhale 1 puff into the lungs in the morning and at bedtime., Disp: 1 each, Rfl: 12  Allergies  Allergen Reactions  . Mushroom Extract Complex Rash  . Penicillins Rash    Objective:   VITALS: Per patient if applicable, see vitals. GENERAL: Alert, appears well and in no acute distress. HEENT: Atraumatic, conjunctiva clear, no obvious abnormalities on inspection of external nose and ears. NECK: Normal movements of the head and neck. CARDIOPULMONARY: No increased WOB. Speaking in clear sentences. I:E ratio WNL.  MS: Moves all visible extremities without noticeable abnormality. PSYCH: Pleasant and cooperative, well-groomed. Speech  normal rate and rhythm. Affect is appropriate. Insight and judgement are appropriate. Attention is focused, linear, and appropriate.  NEURO: CN grossly intact. Oriented as arrived to appointment on time with no prompting. Moves both UE equally.  SKIN: No obvious lesions, wounds, erythema, or cyanosis  noted on face or hands.  Assessment and Plan:   Shirley Nunez was seen today for covid positive.  Diagnoses and all orders for this visit:  COVID-19  Influenza  Other orders -     benzonatate (TESSALON PERLES) 100 MG capsule; Take 1 capsule (100 mg total) by mouth 3 (three) times daily as needed for cough. -     fluticasone (FLOVENT HFA) 44 MCG/ACT inhaler; Inhale 1 puff into the lungs in the morning and at bedtime. -     albuterol (PROVENTIL) (2.5 MG/3ML) 0.083% nebulizer solution; Take 3 mLs (2.5 mg total) by nebulization every 6 (six) hours as needed for wheezing or shortness of breath.    No red flags on discussion, patient is not in any obvious distress during our visit. Discussed progression of most viral illness, and recommended supportive care at this point in time. Flovent inhaler BID and nebulizer solution for nebulizer at home as needed. Tessalon perles prescribed for as needed. Discussed over the counter supportive care options, with recommendations to push fluids and rest.  Reviewed return precautions including new/worsening fever, SOB, new/worsening cough or other concerns.  Recommended need to self-quarantine and practice social distancing until symptoms resolve. I have also sent mychart message with precautions/red flags for her to review. I have also sent message to see if the antibody infusion can be scheduled for her if she qualifies.  I recommend that patient follow-up if symptoms worsen or persist despite treatment x 7-10 days, sooner if needed.  I discussed the assessment and treatment plan with the patient. The patient was provided an opportunity to ask questions and all were answered. The patient agreed with the plan and demonstrated an understanding of the instructions.   The patient was advised to call back or seek an in-person evaluation if the symptoms worsen or if the condition fails to improve as anticipated.   CMA or LPN served as scribe during this visit.  History, Physical, and Plan performed by medical provider. The above documentation has been reviewed and is accurate and complete.   [place], [place] 04/30/2020

## 2020-04-30 NOTE — ED Notes (Signed)
Pt stated she was going home and left out

## 2020-05-01 ENCOUNTER — Telehealth (HOSPITAL_COMMUNITY): Payer: Self-pay | Admitting: Family

## 2020-05-01 NOTE — Telephone Encounter (Signed)
Called to discuss with Shirley Nunez about Covid symptoms and potential candidacy for the use of sotrovimab, a combination monoclonal antibody infusion for those with mild to moderate Covid symptoms and at a high risk of hospitalization.     Pt is qualified for this infusion at the infusion center due to co-morbid conditions and/or a member of an at-risk group, however she is currently day 9, and unable to reach patient as VM box is full and cannot accept messages.   Delina Kruczek,NP

## 2020-05-08 ENCOUNTER — Telehealth: Payer: Self-pay

## 2020-05-08 ENCOUNTER — Telehealth (INDEPENDENT_AMBULATORY_CARE_PROVIDER_SITE_OTHER): Payer: BC Managed Care – PPO | Admitting: Physician Assistant

## 2020-05-08 ENCOUNTER — Encounter: Payer: Self-pay | Admitting: Physician Assistant

## 2020-05-08 ENCOUNTER — Other Ambulatory Visit: Payer: Self-pay

## 2020-05-08 VITALS — Temp 97.1°F | Ht 65.0 in | Wt 130.0 lb

## 2020-05-08 DIAGNOSIS — R059 Cough, unspecified: Secondary | ICD-10-CM

## 2020-05-08 MED ORDER — PREDNISONE 20 MG PO TABS: 40.0000 mg | ORAL_TABLET | Freq: Every day | ORAL | 0 refills | Status: DC

## 2020-05-08 NOTE — Progress Notes (Signed)
Virtual Visit via Video   I connected with Shirley Nunez on 05/08/20 at 12:30 PM EST by a video enabled telemedicine application and verified that I am speaking with the correct person using two identifiers. Location patient: Home Location provider: [facility], Office Persons participating in the virtual visit: Adam Z Klett, Jarold Motto PA-C, Corky Mull, LPN   I discussed the limitations of evaluation and management by telemedicine and the availability of in person appointments. The patient expressed understanding and agreed to proceed.  I acted as a Neurosurgeon for [employer], [employer], LPN   Subjective:   HPI:   Cough Pt still c/o a dry non-productive cough. Dx with COVID on 12/16. Currently using Tessalon capsules, Albuterol nebulizer and Flovent inhaler. Pt is having coughing spells. Pt is also very fatigued. Denies fever or chills, SOB, chest pain, lightheadedness, dizziness, n/v/d, calf pain/swelling.  She unfortunately did not get an antibody infusion because she was unable to respond to their phone calls to schedule this.   ROS: See pertinent positives and negatives per HPI.  Patient Active Problem List   Diagnosis Date Noted  . Depression, major, single episode, mild ([facility]) 05/04/2018  . Insomnia 05/04/2018  . Migraine 07/16/2017  . Joint stiffness 11/09/2015  . Herpes simplex of female genitalia 08/16/2014  . Essential hypertension 07/12/2014  . Sickle cell trait ([facility]) 07/12/2014    Social History   Tobacco Use  . Smoking status: Never Smoker  . Smokeless tobacco: Never Used  Substance Use Topics  . Alcohol use: Yes    Alcohol/week: 2.0 standard drinks    Types: 2 Glasses of wine per week    Current Outpatient Medications:  .  albuterol (PROVENTIL) (2.5 MG/3ML) 0.083% nebulizer solution, Take 3 mLs (2.5 mg total) by nebulization every 6 (six) hours as needed for wheezing or shortness of breath., Disp: 150 mL, Rfl: 1 .  benzonatate  (TESSALON PERLES) 100 MG capsule, Take 1 capsule (100 mg total) by mouth 3 (three) times daily as needed for cough., Disp: 30 capsule, Rfl: 1 .  fluticasone (FLOVENT HFA) 44 MCG/ACT inhaler, Inhale 1 puff into the lungs in the morning and at bedtime., Disp: 1 each, Rfl: 12 .  hydrochlorothiazide (HYDRODIURIL) 25 MG tablet, Take 1 tablet by mouth once daily, Disp: 90 tablet, Rfl: 0 .  Multiple Vitamin (MULTIVITAMIN) tablet, Take 1 tablet by mouth daily., Disp: , Rfl:  .  predniSONE (DELTASONE) 20 MG tablet, Take 2 tablets (40 mg total) by mouth daily., Disp: 10 tablet, Rfl: 0 .  valACYclovir (VALTREX) 500 MG tablet, Take 1 tablet (500 mg total) by mouth daily., Disp: 90 tablet, Rfl: 1  Allergies  Allergen Reactions  . Mushroom Extract Complex Rash  . Penicillins Rash    Objective:   VITALS: Per patient if applicable, see vitals. GENERAL: Alert, appears well and in no acute distress. HEENT: Atraumatic, conjunctiva clear, no obvious abnormalities on inspection of external nose and ears. NECK: Normal movements of the head and neck. CARDIOPULMONARY: No increased WOB. Speaking in clear sentences with frequent coughing. I:E ratio WNL.  MS: Moves all visible extremities without noticeable abnormality. PSYCH: Pleasant and cooperative, well-groomed. Speech normal rate and rhythm. Affect is appropriate. Insight and judgement are appropriate. Attention is focused, linear, and appropriate.  NEURO: CN grossly intact. Oriented as arrived to appointment on time with no prompting. Moves both UE equally.  SKIN: No obvious lesions, wounds, erythema, or cyanosis noted on face or hands.  Assessment and Plan:  Shirley Nunez was seen today for cough.  Diagnoses and all orders for this visit:  Cough -     DG Chest 2 View; Future  Other orders -     predniSONE (DELTASONE) 20 MG tablet; Take 2 tablets (40 mg total) by mouth daily.   No red flags on discussion.  Will initiate prednisone for post-viral cough and  bronchospasms per orders. We are also ordering chest xray. Discussed taking medications as prescribed. Reviewed return precautions.  If any worsening symptoms -- needs inperson care.  Recommend follow-up with me in office next week for labs/vitals/etc.  I discussed the assessment and treatment plan with the patient. The patient was provided an opportunity to ask questions and all were answered. The patient agreed with the plan and demonstrated an understanding of the instructions.   The patient was advised to call back or seek an in-person evaluation if the symptoms worsen or if the condition fails to improve as anticipated.   CMA or LPN served as scribe during this visit. History, Physical, and Plan performed by medical provider. The above documentation has been reviewed and is accurate and complete.  [place], [place] 05/08/2020

## 2020-05-08 NOTE — Telephone Encounter (Signed)
LVM for patient to call back and schedule appt with [facility] for next week at the end of the day.   Per Lelon Mast note  "can you or someone call my 12:30 Shirley Nunez and put her in on my schedule for next week? ideally at end of the day in person. tell her i want to get labs and assess her return to work status."

## 2020-05-09 ENCOUNTER — Ambulatory Visit (INDEPENDENT_AMBULATORY_CARE_PROVIDER_SITE_OTHER)
Admission: RE | Admit: 2020-05-09 | Discharge: 2020-05-09 | Disposition: A | Discharge status: Home / Self Care | Payer: BC Managed Care – PPO | Source: Ambulatory Visit | Attending: Physician Assistant | Admitting: Physician Assistant

## 2020-05-09 DIAGNOSIS — R059 Cough, unspecified: Secondary | ICD-10-CM | POA: Diagnosis not present

## 2020-05-10 ENCOUNTER — Encounter: Payer: Self-pay | Admitting: Physician Assistant

## 2020-05-15 ENCOUNTER — Ambulatory Visit (INDEPENDENT_AMBULATORY_CARE_PROVIDER_SITE_OTHER): Payer: BC Managed Care – PPO | Admitting: Physician Assistant

## 2020-05-15 ENCOUNTER — Other Ambulatory Visit: Payer: Self-pay

## 2020-05-15 ENCOUNTER — Encounter: Payer: Self-pay | Admitting: Physician Assistant

## 2020-05-15 VITALS — BP 128/80 | HR 91 | Temp 98.1°F | Ht 65.0 in | Wt 132.0 lb

## 2020-05-15 DIAGNOSIS — G9331 Postviral fatigue syndrome: Secondary | ICD-10-CM

## 2020-05-15 DIAGNOSIS — E559 Vitamin D deficiency, unspecified: Secondary | ICD-10-CM | POA: Diagnosis not present

## 2020-05-15 DIAGNOSIS — U071 COVID-19: Secondary | ICD-10-CM

## 2020-05-15 DIAGNOSIS — G933 Postviral fatigue syndrome: Secondary | ICD-10-CM | POA: Diagnosis not present

## 2020-05-15 DIAGNOSIS — R059 Cough, unspecified: Secondary | ICD-10-CM | POA: Diagnosis not present

## 2020-05-15 MED ORDER — HYDROCOD POLST-CPM POLST ER 10-8 MG/5ML PO SUER
5.0000 mL | Freq: Every evening | ORAL | 0 refills | Status: DC | PRN
Start: 2020-05-15 — End: 2020-05-18

## 2020-05-15 NOTE — Patient Instructions (Signed)
It was great to see you!  Use flovent -- 2 puffs in AM and 2 puffs in PM Albuterol in between  Get your kids vaccinated!  We will complete your paperwork.  Labs today  Virtual follow-up in 2 weeks  Take care,  Jarold Motto PA-C

## 2020-05-15 NOTE — Progress Notes (Signed)
Shirley Nunez is a 45 y.o. female is here for follow up.  I acted as a Education administrator for [employer], PA-C Anselmo Pickler, LPN   History of Present Illness:   Chief Complaint  Patient presents with  . Cough    HPI   COVID-19 Tested positive for COVID-19 on 12/16. She is here to follow-up in person about this.  She remains out of work but is overall doing better. Still has significant fatigue and coughing spells but both of these are improving.  Denies further fevers. She is doing albuterol nebulizer in AM and taking Flovent puffs throughout the day. She was prescribed Prednisone 20 mg BID x 5 days. Tessalon perles caused her to feel wide awake so she has not continued this.  Denies: chest pain, lower leg pain/swelling, palpitations, SOB  Patient also has Vit D deficiency -- due for updated labs today.  Health Maintenance Due  Topic Date Due  . COVID-19 Vaccine (1) Never done  . COLONOSCOPY (Pts 45-29yrs Insurance coverage will need to be confirmed)  Never done    Past Medical History:  Diagnosis Date  . Anxiety   . Depression   . HSV infection    on daily medication  . Migraine   . Sickle cell anemia (HCC)    Sickle cell trait     Social History   Tobacco Use  . Smoking status: Never Smoker  . Smokeless tobacco: Never Used  Vaping Use  . Vaping Use: Never used  Substance Use Topics  . Alcohol use: Yes    Alcohol/week: 2.0 standard drinks    Types: 2 Glasses of wine per week  . Drug use: No    Past Surgical History:  Procedure Laterality Date  . WISDOM TOOTH EXTRACTION      Family History  Problem Relation Age of Onset  . Hypertension Mother   . Depression Mother   . Miscarriages / [place] Mother   . Hypertension Father   . Hyperlipidemia Father   . Diabetes Father   . Arthritis Father   . Stroke Father   . Sickle cell anemia Sister   . Alcohol abuse Maternal Grandmother   . Arthritis Maternal Grandmother   . Depression Maternal  Grandmother   . Diabetes Maternal Grandmother   . Heart attack Maternal Grandmother   . Hypertension Maternal Grandmother   . Stroke Maternal Grandmother   . Heart attack Maternal Grandfather   . Lung cancer Paternal Grandmother        mets to brain  . Alcohol abuse Paternal Grandfather   . Heart attack Paternal Grandfather   . Hypertension Paternal Grandfather   . Stroke Paternal Grandfather   . Colon cancer Neg Hx     PMHx, SurgHx, SocialHx, FamHx, Medications, and Allergies were reviewed in the Visit Navigator and updated as appropriate.   Patient Active Problem List   Diagnosis Date Noted  . Depression, major, single episode, mild ([place]) 05/04/2018  . Insomnia 05/04/2018  . Migraine 07/16/2017  . Joint stiffness 11/09/2015  . Herpes simplex of female genitalia 08/16/2014  . Essential hypertension 07/12/2014  . Sickle cell trait ([place]) 07/12/2014    Social History   Tobacco Use  . Smoking status: Never Smoker  . Smokeless tobacco: Never Used  Vaping Use  . Vaping Use: Never used  Substance Use Topics  . Alcohol use: Yes    Alcohol/week: 2.0 standard drinks    Types: 2 Glasses of wine per week  . Drug use:  No    Current Medications and Allergies:    Current Outpatient Medications:  .  albuterol (PROVENTIL) (2.5 MG/3ML) 0.083% nebulizer solution, Take 3 mLs (2.5 mg total) by nebulization every 6 (six) hours as needed for wheezing or shortness of breath., Disp: 150 mL, Rfl: 1 .  chlorpheniramine-HYDROcodone (TUSSIONEX PENNKINETIC ER) 10-8 MG/5ML SUER, Take 5 mLs by mouth at bedtime as needed for cough., Disp: 70 mL, Rfl: 0 .  fluticasone (FLOVENT HFA) 44 MCG/ACT inhaler, Inhale 1 puff into the lungs in the morning and at bedtime., Disp: 1 each, Rfl: 12 .  hydrochlorothiazide (HYDRODIURIL) 25 MG tablet, Take 1 tablet by mouth once daily, Disp: 90 tablet, Rfl: 0 .  Multiple Vitamin (MULTIVITAMIN) tablet, Take 1 tablet by mouth daily., Disp: , Rfl:  .  valACYclovir  (VALTREX) 500 MG tablet, Take 1 tablet (500 mg total) by mouth daily., Disp: 90 tablet, Rfl: 1   Allergies  Allergen Reactions  . Mushroom Extract Complex Rash  . Penicillins Rash    Review of Systems   ROS  Negative unless otherwise specified per HPI.  Vitals:   Vitals:   05/15/20 1446  BP: 128/80  Pulse: 91  Temp: 98.1 F (36.7 C)  TempSrc: Temporal  Weight: 132 lb (59.9 kg)  Height: 5\' 5"  (1.651 m)     Body mass index is 21.97 kg/m.   Physical Exam:    Physical Exam Vitals and nursing note reviewed.  Constitutional:      General: She is not in acute distress.    Appearance: She is well-developed. She is not ill-appearing, toxic-appearing or sickly-appearing.     Comments: Occasional barky cough  Cardiovascular:     Rate and Rhythm: Normal rate and regular rhythm.     Pulses: Normal pulses.     Heart sounds: Normal heart sounds, S1 normal and S2 normal.     Comments: No LE edema Pulmonary:     Effort: Pulmonary effort is normal.     Breath sounds: Normal breath sounds.  Skin:    General: Skin is warm, dry and intact.  Neurological:     Mental Status: She is alert.     GCS: GCS eye subscore is 4. GCS verbal subscore is 5. GCS motor subscore is 6.  Psychiatric:        Mood and Affect: Mood and affect normal. Affect is tearful.        Speech: Speech normal.        Behavior: Behavior normal. Behavior is cooperative.      Assessment and Plan:    Shirley Nunez was seen today for cough.  Diagnoses and all orders for this visit:  COVID-19; Post viral fatigue syndrome Symptoms improving with time. Update CBC and CMP today. No red flags on exam. Discussed using flovent BID SCHEDULED and use albuterol prn. I think this will help reduce her cough. Also provided tussionex to help with nighttime cough she can get some rest. Follow-up virtually in 2 weeks to reassess return to work date. -     CBC with Differential/Platelet -     Comprehensive metabolic  panel  Vitamin D deficiency Update Vit D and determine recommendation of supplementation at this time. -     VITAMIN D 25 Hydroxy (Vit-D Deficiency, Fractures)  Other orders -     chlorpheniramine-HYDROcodone (TUSSIONEX PENNKINETIC ER) 10-8 MG/5ML SUER; Take 5 mLs by mouth at bedtime as needed for cough.  We also completed FMLA for patient today.  CMA or LPN served  as scribe during this visit. History, Physical, and Plan performed by medical provider. The above documentation has been reviewed and is accurate and complete.  Jarold Motto, PA-C [place], [facility] 05/15/2020  Follow-up: No follow-ups on file.

## 2020-05-16 ENCOUNTER — Ambulatory Visit: Payer: BC Managed Care – PPO | Admitting: Physician Assistant

## 2020-05-16 ENCOUNTER — Other Ambulatory Visit: Payer: Self-pay | Admitting: Physician Assistant

## 2020-05-16 LAB — COMPREHENSIVE METABOLIC PANEL
ALT: 18 U/L (ref 0–35)
AST: 16 U/L (ref 0–37)
Albumin: 4.4 g/dL (ref 3.5–5.2)
Alkaline Phosphatase: 47 U/L (ref 39–117)
BUN: 13 mg/dL (ref 6–23)
CO2: 35 mEq/L — ABNORMAL HIGH (ref 19–32)
Calcium: 9.4 mg/dL (ref 8.4–10.5)
Chloride: 97 mEq/L (ref 96–112)
Creatinine, Ser: 0.89 mg/dL (ref 0.40–1.20)
GFR: 78.1 mL/min (ref >60.00)
Glucose, Bld: 89 mg/dL (ref 70–99)
Potassium: 3.1 mEq/L — ABNORMAL LOW (ref 3.5–5.1)
Sodium: 140 mEq/L (ref 135–145)
Total Bilirubin: 0.5 mg/dL (ref 0.2–1.2)
Total Protein: 8 g/dL (ref 6.0–8.3)

## 2020-05-16 LAB — CBC WITH DIFFERENTIAL/PLATELET
Basophils Absolute: 0.1 10*3/uL (ref 0.0–0.1)
Basophils Relative: 1.2 % (ref 0.0–3.0)
Eosinophils Absolute: 0.1 10*3/uL (ref 0.0–0.7)
Eosinophils Relative: 1.5 % (ref 0.0–5.0)
HCT: 36.6 % (ref 36.0–46.0)
Hemoglobin: 12.2 g/dL (ref 12.0–15.0)
Lymphocytes Relative: 29.9 % (ref 12.0–46.0)
Lymphs Abs: 2.3 10*3/uL (ref 0.7–4.0)
MCHC: 33.3 g/dL (ref 30.0–36.0)
MCV: 98.1 fl (ref 78.0–100.0)
Monocytes Absolute: 0.5 10*3/uL (ref 0.1–1.0)
Monocytes Relative: 6.8 % (ref 3.0–12.0)
Neutro Abs: 4.7 10*3/uL (ref 1.4–7.7)
Neutrophils Relative %: 60.6 % (ref 43.0–77.0)
Platelets: 453 10*3/uL — ABNORMAL HIGH (ref 150.0–400.0)
RBC: 3.74 Mil/uL — ABNORMAL LOW (ref 3.87–5.11)
RDW: 13.1 % (ref 11.5–15.5)
WBC: 7.7 10*3/uL (ref 4.0–10.5)

## 2020-05-16 LAB — VITAMIN D 25 HYDROXY (VIT D DEFICIENCY, FRACTURES): VITD: 54.65 ng/mL (ref 30.00–100.00)

## 2020-05-16 MED ORDER — POTASSIUM CHLORIDE ER 10 MEQ PO TBCR: 10.0000 meq | EXTENDED_RELEASE_TABLET | Freq: Two times a day (BID) | ORAL | 0 refills | Status: DC

## 2020-05-17 ENCOUNTER — Encounter: Payer: Self-pay | Admitting: Physician Assistant

## 2020-05-18 ENCOUNTER — Encounter: Payer: Self-pay | Admitting: Physician Assistant

## 2020-05-18 ENCOUNTER — Other Ambulatory Visit: Payer: Self-pay | Admitting: Physician Assistant

## 2020-05-18 MED ORDER — HYDROCOD POLST-CPM POLST ER 10-8 MG/5ML PO SUER: 5.0000 mL | Freq: Every evening | ORAL | 0 refills | Status: DC | PRN

## 2020-05-18 NOTE — Telephone Encounter (Signed)
See result notes. 

## 2020-05-24 NOTE — Telephone Encounter (Signed)
Spoke to pt told her need to clarify when going back to work and how long you want off? Pt said she is afraid to go back to work with out being vaccinated due to working with the public everyday. Told pt per [place]:  Per current CDC vaccination guidelines: "People who have had a known COVID-19 exposure should not seek vaccination until their quarantine period has ended." Because she did not receive any monoclonal infusions she does not need to wait 90 days to start the vaccination series.   She tested positive for COVID-19 on 04/26/20. CDC recommends symptomatic patients isolate for a total of 10 days as long as symptoms have improved. Her symptoms haven't consistently improved, so I feel like extending that to 20 days (which is recommended for those with severe COVID illness) is reasonable.   20 days from 04/26/20 is 05/16/20 --> I think that she is eligible for her vaccine series now, if she wants to wait until she feels a bit better, maybe another week or two, would be reasonable.   If she gets her first vaccine at the end of the month, say 06/04/20 and second dose 3-4 weeks later, and then we calculate another 2 weeks for the vaccine to be considered effective.... I would suggest possible return to work date would be around March 7th.   To get back even sooner she could do the J&J vaccine as it is only one dose. Then I would recommend return to work around 06/25/20.   I don't think it is realistic to return to work after waiting for the booster, as that is a significant amount of time off and she should have very good immunity after recovering from [place] and then having the initial vaccines.   Pt verbalized understanding and said she is agreeable to go back to work by March 7th, get her 1st vaccine on 06/04/20, does not want J&J. Pt said she will schedule vaccine now for 06/04/2020. Told pt okay I will put on FMLA paperwork that you are returning to work on July 16, 2020. I will refax over to [facility].  Pt verbalized understanding.

## 2020-05-29 ENCOUNTER — Other Ambulatory Visit: Payer: Self-pay

## 2020-05-29 ENCOUNTER — Telehealth (INDEPENDENT_AMBULATORY_CARE_PROVIDER_SITE_OTHER): Payer: BC Managed Care – PPO | Admitting: Physician Assistant

## 2020-05-29 ENCOUNTER — Encounter: Payer: Self-pay | Admitting: Physician Assistant

## 2020-05-29 VITALS — HR 94 | Temp 97.7°F | Ht 65.0 in | Wt 132.0 lb

## 2020-05-29 DIAGNOSIS — U071 COVID-19: Secondary | ICD-10-CM

## 2020-05-29 DIAGNOSIS — G9331 Postviral fatigue syndrome: Secondary | ICD-10-CM

## 2020-05-29 DIAGNOSIS — G933 Postviral fatigue syndrome: Secondary | ICD-10-CM

## 2020-05-29 NOTE — Progress Notes (Signed)
Virtual Visit via Video   I connected with Shirley Nunez on 05/29/20 at 12:00 PM EST by a video enabled telemedicine application and verified that I am speaking with the correct person using two identifiers. Location patient: Home Location provider: [facility], Office Persons participating in the virtual visit: Shirley Nunez, Inda Coke PA-C, Anselmo Pickler, LPN   I discussed the limitations of evaluation and management by telemedicine and the availability of in person appointments. The patient expressed understanding and agreed to proceed.  I acted as a Education administrator for [employer], PA-C [employer], LPN   Subjective:   HPI:   Cough Pt following up says she is doing much better and only coughing at night. Using Tussionex but only 1/2 the dose and not every night. Using Flovent BID and Albuterol Nebulizer every other day. Continues to have fatigue. Will have SOB when going up stairs but this has improved with time (used to only be able to go up half of her stairs.)  Wt Readings from Last 4 Encounters:  05/29/20 132 lb (59.9 kg)  05/15/20 132 lb (59.9 kg)  05/08/20 130 lb (59 kg)  04/30/20 135 lb (61.2 kg)   Eating one snack and one meal a day. Feels like she is drinking plenty of water.  Denies: significant SOB, chest pain, palpitaitons  ROS: See pertinent positives and negatives per HPI.  Patient Active Problem List   Diagnosis Date Noted  . Depression, major, single episode, mild ([place]) 05/04/2018  . Insomnia 05/04/2018  . Migraine 07/16/2017  . Joint stiffness 11/09/2015  . Herpes simplex of female genitalia 08/16/2014  . Essential hypertension 07/12/2014  . Sickle cell trait ([place]) 07/12/2014    Social History   Tobacco Use  . Smoking status: Never Smoker  . Smokeless tobacco: Never Used  Substance Use Topics  . Alcohol use: Yes    Alcohol/week: 2.0 standard drinks    Types: 2 Glasses of wine per week    Current Outpatient Medications:  .  albuterol  (PROVENTIL) (2.5 MG/3ML) 0.083% nebulizer solution, Take 3 mLs (2.5 mg total) by nebulization every 6 (six) hours as needed for wheezing or shortness of breath., Disp: 150 mL, Rfl: 1 .  chlorpheniramine-HYDROcodone (TUSSIONEX PENNKINETIC ER) 10-8 MG/5ML SUER, Take 5 mLs by mouth at bedtime as needed for cough., Disp: 70 mL, Rfl: 0 .  fluticasone (FLOVENT HFA) 44 MCG/ACT inhaler, Inhale 1 puff into the lungs in the morning and at bedtime., Disp: 1 each, Rfl: 12 .  hydrochlorothiazide (HYDRODIURIL) 25 MG tablet, Take 1 tablet by mouth once daily, Disp: 90 tablet, Rfl: 0 .  Multiple Vitamin (MULTIVITAMIN) tablet, Take 1 tablet by mouth daily., Disp: , Rfl:  .  potassium chloride (KLOR-CON) 10 MEQ tablet, Take 1 tablet (10 mEq total) by mouth 2 (two) times daily., Disp: 14 tablet, Rfl: 0 .  valACYclovir (VALTREX) 500 MG tablet, Take 1 tablet (500 mg total) by mouth daily., Disp: 90 tablet, Rfl: 1  Allergies  Allergen Reactions  . Mushroom Extract Complex Rash  . Penicillins Rash    Objective:   VITALS: Per patient if applicable, see vitals. GENERAL: Alert, appears well and in no acute distress. HEENT: Atraumatic, conjunctiva clear, no obvious abnormalities on inspection of external nose and ears. NECK: Normal movements of the head and neck. CARDIOPULMONARY: No increased WOB. Speaking in clear sentences. I:E ratio WNL.  MS: Moves all visible extremities without noticeable abnormality. PSYCH: Pleasant and cooperative, well-groomed. Speech normal rate and rhythm. Affect  is appropriate. Insight and judgement are appropriate. Attention is focused, linear, and appropriate.  NEURO: CN grossly intact. Oriented as arrived to appointment on time with no prompting. Moves both UE equally.  SKIN: No obvious lesions, wounds, erythema, or cyanosis noted on face or hands.  Assessment and Plan:   [name] was seen today for cough.  Diagnoses and all orders for this visit:  COVID-19  Postviral fatigue  syndrome   Slowly improving. Scheduled for vaccine next week! We are hoping that this improves her symptoms as well. Continue to work on increased protein intake and hydration. Discussed prioritizing protein intake. Follow-up in office in two weeks so we can recheck blood pressure and potassium, sooner if concerns.   I discussed the assessment and treatment plan with the patient. The patient was provided an opportunity to ask questions and all were answered. The patient agreed with the plan and demonstrated an understanding of the instructions.   The patient was advised to call back or seek an in-person evaluation if the symptoms worsen or if the condition fails to improve as anticipated.   CMA or LPN served as scribe during this visit. History, Physical, and Plan performed by medical provider. The above documentation has been reviewed and is accurate and complete.  [place], [place] 05/29/2020

## 2020-06-12 ENCOUNTER — Other Ambulatory Visit: Payer: Self-pay

## 2020-06-12 ENCOUNTER — Encounter: Payer: Self-pay | Admitting: Physician Assistant

## 2020-06-12 ENCOUNTER — Ambulatory Visit (INDEPENDENT_AMBULATORY_CARE_PROVIDER_SITE_OTHER): Payer: BC Managed Care – PPO | Admitting: Physician Assistant

## 2020-06-12 VITALS — BP 120/82 | HR 82 | Temp 98.3°F | Ht 65.0 in | Wt 137.5 lb

## 2020-06-12 DIAGNOSIS — G9331 Postviral fatigue syndrome: Secondary | ICD-10-CM

## 2020-06-12 DIAGNOSIS — I10 Essential (primary) hypertension: Secondary | ICD-10-CM

## 2020-06-12 DIAGNOSIS — R059 Cough, unspecified: Secondary | ICD-10-CM

## 2020-06-12 DIAGNOSIS — G933 Postviral fatigue syndrome: Secondary | ICD-10-CM | POA: Diagnosis not present

## 2020-06-12 LAB — BASIC METABOLIC PANEL
BUN: 15 mg/dL (ref 6–23)
CO2: 30 mEq/L (ref 19–32)
Calcium: 9.4 mg/dL (ref 8.4–10.5)
Chloride: 101 mEq/L (ref 96–112)
Creatinine, Ser: 0.78 mg/dL (ref 0.40–1.20)
GFR: 91.45 mL/min (ref >60.00)
Glucose, Bld: 94 mg/dL (ref 70–99)
Potassium: 2.9 mEq/L — ABNORMAL LOW (ref 3.5–5.1)
Sodium: 136 mEq/L (ref 135–145)

## 2020-06-12 NOTE — Progress Notes (Signed)
Shirley Nunez is a 45 y.o. female is here for follow up.  I acted as a Education administrator for [employer], PA-C Anselmo Pickler, LPN   History of Present Illness:   Chief Complaint  Patient presents with  . Cough    HPI   Cough Pt said cough is better, having coughing spells in the morning first thing and at night before bed. Voice is raspy. She is using nebulizer every 3-4 days.   Received first dose of Pfizer COVID vaccine on 06/04/20. Tolerated well overall. Has the next one scheduled in a couple of weeks.  Started protein shakes and this has helped her weight stabilize, slight gain. Still endorses ongoing post-COVID fatigue.  Denies: chest pain, new/worsening SOB, LE swelling  Wt Readings from Last 5 Encounters:  06/12/20 137 lb 8 oz (62.4 kg)  05/29/20 132 lb (59.9 kg)  05/15/20 132 lb (59.9 kg)  05/08/20 130 lb (59 kg)  04/30/20 135 lb (61.2 kg)   HTN Currently taking HCTZ 25 mg. At home blood pressure readings are: 115/60. Patient denies chest pain, SOB, blurred vision, dizziness, unusual headaches, lower leg swelling. Patient is compliant with medication. Denies excessive caffeine intake, stimulant usage, excessive alcohol intake, or increase in salt consumption.   We have been monitoring her potassium - it has occasionally been below goal, and she has been given a few rounds of oral potassium.  BP Readings from Last 3 Encounters:  06/12/20 120/82  05/15/20 128/80  09/14/19 118/82      Health Maintenance Due  Topic Date Due  . COLONOSCOPY (Pts 45-73yrs Insurance coverage will need to be confirmed)  Never done    Past Medical History:  Diagnosis Date  . Anxiety   . Depression   . HSV infection    on daily medication  . Migraine   . Sickle cell anemia (HCC)    Sickle cell trait     Social History   Tobacco Use  . Smoking status: Never Smoker  . Smokeless tobacco: Never Used  Vaping Use  . Vaping Use: Never used  Substance Use Topics  . Alcohol use:  Yes    Alcohol/week: 2.0 standard drinks    Types: 2 Glasses of wine per week  . Drug use: No    Past Surgical History:  Procedure Laterality Date  . WISDOM TOOTH EXTRACTION      Family History  Problem Relation Age of Onset  . Hypertension Mother   . Depression Mother   . Miscarriages / [place] Mother   . Hypertension Father   . Hyperlipidemia Father   . Diabetes Father   . Arthritis Father   . Stroke Father   . Sickle cell anemia Sister   . Alcohol abuse Maternal Grandmother   . Arthritis Maternal Grandmother   . Depression Maternal Grandmother   . Diabetes Maternal Grandmother   . Heart attack Maternal Grandmother   . Hypertension Maternal Grandmother   . Stroke Maternal Grandmother   . Heart attack Maternal Grandfather   . Lung cancer Paternal Grandmother        mets to brain  . Alcohol abuse Paternal Grandfather   . Heart attack Paternal Grandfather   . Hypertension Paternal Grandfather   . Stroke Paternal Grandfather   . Colon cancer Neg Hx     PMHx, SurgHx, SocialHx, FamHx, Medications, and Allergies were reviewed in the Visit Navigator and updated as appropriate.   Patient Active Problem List   Diagnosis Date Noted  .  Depression, major, single episode, mild ([place]) 05/04/2018  . Insomnia 05/04/2018  . Migraine 07/16/2017  . Joint stiffness 11/09/2015  . Herpes simplex of female genitalia 08/16/2014  . Essential hypertension 07/12/2014  . Sickle cell trait ([place]) 07/12/2014    Social History   Tobacco Use  . Smoking status: Never Smoker  . Smokeless tobacco: Never Used  Vaping Use  . Vaping Use: Never used  Substance Use Topics  . Alcohol use: Yes    Alcohol/week: 2.0 standard drinks    Types: 2 Glasses of wine per week  . Drug use: No    Current Medications and Allergies:    Current Outpatient Medications:  .  albuterol (PROVENTIL) (2.5 MG/3ML) 0.083% nebulizer solution, Take 3 mLs (2.5 mg total) by nebulization every 6 (six) hours as  needed for wheezing or shortness of breath., Disp: 150 mL, Rfl: 1 .  chlorpheniramine-HYDROcodone (TUSSIONEX PENNKINETIC ER) 10-8 MG/5ML SUER, Take 5 mLs by mouth at bedtime as needed for cough., Disp: 70 mL, Rfl: 0 .  fluticasone (FLOVENT HFA) 44 MCG/ACT inhaler, Inhale 1 puff into the lungs in the morning and at bedtime., Disp: 1 each, Rfl: 12 .  hydrochlorothiazide (HYDRODIURIL) 25 MG tablet, Take 1 tablet by mouth once daily, Disp: 90 tablet, Rfl: 0 .  Multiple Vitamin (MULTIVITAMIN) tablet, Take 1 tablet by mouth daily., Disp: , Rfl:  .  valACYclovir (VALTREX) 500 MG tablet, Take 1 tablet (500 mg total) by mouth daily., Disp: 90 tablet, Rfl: 1   Allergies  Allergen Reactions  . Mushroom Extract Complex Rash  . Penicillins Rash    Review of Systems   ROS Negative unless otherwise specified per HPI.  Vitals:   Vitals:   06/12/20 1007  BP: 120/82  Pulse: 82  Temp: 98.3 F (36.8 C)  TempSrc: Temporal  SpO2: 97%  Weight: 137 lb 8 oz (62.4 kg)  Height: 5\' 5"  (1.651 m)     Body mass index is 22.88 kg/m.   Physical Exam:    Physical Exam Vitals and nursing note reviewed.  Constitutional:      General: She is not in acute distress.    Appearance: She is well-developed. She is not ill-appearing, toxic-appearing or sickly-appearing.  Cardiovascular:     Rate and Rhythm: Normal rate and regular rhythm.     Pulses: Normal pulses.     Heart sounds: Normal heart sounds, S1 normal and S2 normal.     Comments: No LE edema Pulmonary:     Effort: Pulmonary effort is normal.     Breath sounds: Normal breath sounds.  Skin:    General: Skin is warm, dry and intact.  Neurological:     Mental Status: She is alert.     GCS: GCS eye subscore is 4. GCS verbal subscore is 5. GCS motor subscore is 6.  Psychiatric:        Mood and Affect: Mood and affect normal.        Speech: Speech normal.        Behavior: Behavior normal. Behavior is cooperative.      Assessment and Plan:     Shirley Nunez was seen today for cough.  Diagnoses and all orders for this visit:  Postviral fatigue syndrome Ongoing but improving slowly with time. Continue to work on healthy eating, movement when able, pushing fluids.  Cough No red flags on exam. Symptoms are overall improving with time. Offered pulmonary referral but she declined at this time. Follow-up in 3 weeks, sooner if concerns.  Essential hypertension Well controlled. Update BMP today. May decrease HCTZ to 12.5 mg daily based on potassium result. -     Cancel: Basic metabolic panel -     Basic metabolic panel    CMA or LPN served as scribe during this visit. History, Physical, and Plan performed by medical provider. The above documentation has been reviewed and is accurate and complete.   Jarold Motto, PA-C [place] 06/12/2020  Follow-up: No follow-ups on file.

## 2020-06-12 NOTE — Patient Instructions (Signed)
It was great to see you!  Update blood work today, I will be in touch regarding HCTZ dose.  Let's follow-up in 3 weeks, sooner if you have concerns.  Take care,  Inda Coke PA-C

## 2020-07-03 ENCOUNTER — Ambulatory Visit (INDEPENDENT_AMBULATORY_CARE_PROVIDER_SITE_OTHER): Payer: BC Managed Care – PPO | Admitting: Physician Assistant

## 2020-07-03 ENCOUNTER — Other Ambulatory Visit: Payer: Self-pay

## 2020-07-03 ENCOUNTER — Encounter: Payer: Self-pay | Admitting: Physician Assistant

## 2020-07-03 VITALS — BP 130/80 | HR 74 | Temp 98.1°F | Ht 65.0 in | Wt 140.4 lb

## 2020-07-03 DIAGNOSIS — E876 Hypokalemia: Secondary | ICD-10-CM | POA: Diagnosis not present

## 2020-07-03 DIAGNOSIS — I10 Essential (primary) hypertension: Secondary | ICD-10-CM

## 2020-07-03 DIAGNOSIS — U071 COVID-19: Secondary | ICD-10-CM

## 2020-07-03 LAB — BASIC METABOLIC PANEL
BUN: 12 mg/dL (ref 6–23)
CO2: 31 mEq/L (ref 19–32)
Calcium: 9.1 mg/dL (ref 8.4–10.5)
Chloride: 102 mEq/L (ref 96–112)
Creatinine, Ser: 0.74 mg/dL (ref 0.40–1.20)
GFR: 97.37 mL/min (ref >60.00)
Glucose, Bld: 81 mg/dL (ref 70–99)
Potassium: 3.3 mEq/L — ABNORMAL LOW (ref 3.5–5.1)
Sodium: 139 mEq/L (ref 135–145)

## 2020-07-03 NOTE — Progress Notes (Signed)
Shirley Nunez is a 45 y.o. female is here to discuss:  I acted as a Education administrator for [employer], PA-C Anselmo Pickler, LPN   History of Present Illness:   Chief Complaint  Patient presents with  . Cough    HPI  COVID-19; Hypokalemia follow-up; HTN Pt following up on persistent cough since having COVID-19.  Her cough has completely resolved since last seeing me.  She is not using her Tussionex cough syrup.  She has not used her albuterol nebulizer consistently since last seeing me, last use was 1 time over the weekend.  We did advise her to try to minimize use of this because of her persistent hypokalemia.  We also had her stop her hydrochlorothiazide 25 mg daily in light of this as well.  She states that she is doing overall well with this change but has noticed that if she has a glass of wine she will get some swelling in her bilateral hands a few hours after drinking this.  It goes away with time but she is concerned because she did not have this issue when she was on her hydrochlorothiazide.  She is also now taking an over-the-counter potassium supplement that she started 3 days ago.  She received her 2nd Covid dose on 06/25/2020.  Health Maintenance Due  Topic Date Due  . COLONOSCOPY (Pts 45-89yrs Insurance coverage will need to be confirmed)  Never done    Past Medical History:  Diagnosis Date  . Anxiety   . Depression   . HSV infection    on daily medication  . Migraine   . Sickle cell anemia (HCC)    Sickle cell trait     Social History   Tobacco Use  . Smoking status: Never Smoker  . Smokeless tobacco: Never Used  Vaping Use  . Vaping Use: Never used  Substance Use Topics  . Alcohol use: Yes    Alcohol/week: 2.0 standard drinks    Types: 2 Glasses of wine per week  . Drug use: No    Past Surgical History:  Procedure Laterality Date  . WISDOM TOOTH EXTRACTION      Family History  Problem Relation Age of Onset  . Hypertension Mother   . Depression  Mother   . Miscarriages / [place] Mother   . Hypertension Father   . Hyperlipidemia Father   . Diabetes Father   . Arthritis Father   . Stroke Father   . Sickle cell anemia Sister   . Alcohol abuse Maternal Grandmother   . Arthritis Maternal Grandmother   . Depression Maternal Grandmother   . Diabetes Maternal Grandmother   . Heart attack Maternal Grandmother   . Hypertension Maternal Grandmother   . Stroke Maternal Grandmother   . Heart attack Maternal Grandfather   . Lung cancer Paternal Grandmother        mets to brain  . Alcohol abuse Paternal Grandfather   . Heart attack Paternal Grandfather   . Hypertension Paternal Grandfather   . Stroke Paternal Grandfather   . Colon cancer Neg Hx     PMHx, SurgHx, SocialHx, FamHx, Medications, and Allergies were reviewed in the Visit Navigator and updated as appropriate.   Patient Active Problem List   Diagnosis Date Noted  . Depression, major, single episode, mild ([place]) 05/04/2018  . Insomnia 05/04/2018  . Migraine 07/16/2017  . Joint stiffness 11/09/2015  . Herpes simplex of female genitalia 08/16/2014  . Essential hypertension 07/12/2014  . Sickle cell trait ([place]) 07/12/2014  Social History   Tobacco Use  . Smoking status: Never Smoker  . Smokeless tobacco: Never Used  Vaping Use  . Vaping Use: Never used  Substance Use Topics  . Alcohol use: Yes    Alcohol/week: 2.0 standard drinks    Types: 2 Glasses of wine per week  . Drug use: No    Current Medications and Allergies:    Current Outpatient Medications:  .  albuterol (PROVENTIL) (2.5 MG/3ML) 0.083% nebulizer solution, Take 3 mLs (2.5 mg total) by nebulization every 6 (six) hours as needed for wheezing or shortness of breath., Disp: 150 mL, Rfl: 1 .  fluticasone (FLOVENT HFA) 44 MCG/ACT inhaler, Inhale 1 puff into the lungs in the morning and at bedtime., Disp: 1 each, Rfl: 12 .  hydrochlorothiazide (HYDRODIURIL) 25 MG tablet, Take 1 tablet by mouth once  daily, Disp: 90 tablet, Rfl: 0 .  metroNIDAZOLE (METROGEL) 0.75 % vaginal gel, Place vaginally., Disp: , Rfl:  .  Multiple Vitamin (MULTIVITAMIN) tablet, Take 1 tablet by mouth daily., Disp: , Rfl:  .  valACYclovir (VALTREX) 500 MG tablet, Take 1 tablet (500 mg total) by mouth daily., Disp: 90 tablet, Rfl: 1   Allergies  Allergen Reactions  . Mushroom Extract Complex Rash  . Penicillins Rash    Review of Systems   ROS  Negative unless otherwise specified per HPI.  Vitals:   Vitals:   07/03/20 1055  BP: 130/80  Pulse: 74  Temp: 98.1 F (36.7 C)  TempSrc: Temporal  SpO2: 97%  Weight: 140 lb 6.1 oz (63.7 kg)  Height: 5\' 5"  (1.651 m)     Body mass index is 23.36 kg/m.   Physical Exam:    Physical Exam Vitals and nursing note reviewed.  Constitutional:      General: She is not in acute distress.    Appearance: She is well-developed. She is not ill-appearing, toxic-appearing or sickly-appearing.  Cardiovascular:     Rate and Rhythm: Normal rate and regular rhythm.     Pulses: Normal pulses.     Heart sounds: Normal heart sounds, S1 normal and S2 normal.     Comments: No LE edema Pulmonary:     Effort: Pulmonary effort is normal.     Breath sounds: Normal breath sounds.  Skin:    General: Skin is warm, dry and intact.  Neurological:     Mental Status: She is alert.     GCS: GCS eye subscore is 4. GCS verbal subscore is 5. GCS motor subscore is 6.  Psychiatric:        Mood and Affect: Mood and affect normal.        Speech: Speech normal.        Behavior: Behavior normal. Behavior is cooperative.      Assessment and Plan:    Shirley Nunez was seen today for cough.  Diagnoses and all orders for this visit:  COVID-19 She is doing very well and is now fully vaccinated. She is slowly regaining her energy and is going back to work in mid March. I think that she is on track to return to work at this time. No further needs identified at this time.  Hypokalemia;  hypertension Patient is normotensive in office today without her hydrochlorothiazide. We can update her BMP today to check on her potassium level. Consider doing as needed hydrochlorothiazide for swelling if labs are normal. Further work-up based on results. -     Basic metabolic panel   CMA or LPN served as  scribe during this visit. History, Physical, and Plan performed by medical provider. The above documentation has been reviewed and is accurate and complete.   Inda Coke, PA-C [place], [place] 07/03/2020  Follow-up: No follow-ups on file.

## 2020-07-03 NOTE — Patient Instructions (Signed)
It was great to see you!  Update your potassium today.  I will be in touch with this result and the plan.  Take care,  Inda Coke PA-C

## 2020-07-20 ENCOUNTER — Encounter: Payer: Self-pay | Admitting: Physician Assistant

## 2020-07-24 NOTE — Telephone Encounter (Signed)
Spoke to pt told her our part of the forms are filled out but I am unable to fax them due to you did not fill out your part. Pt verbalized understanding. Told pt I will put forms at front desk for her to pickup. Pt verbalized understanding.

## 2020-09-27 ENCOUNTER — Ambulatory Visit (INDEPENDENT_AMBULATORY_CARE_PROVIDER_SITE_OTHER): Payer: BC Managed Care – PPO

## 2020-09-27 ENCOUNTER — Other Ambulatory Visit: Payer: Self-pay

## 2020-09-27 ENCOUNTER — Encounter: Payer: Self-pay | Admitting: Family

## 2020-09-27 ENCOUNTER — Ambulatory Visit (INDEPENDENT_AMBULATORY_CARE_PROVIDER_SITE_OTHER): Payer: BC Managed Care – PPO | Admitting: Family

## 2020-09-27 ENCOUNTER — Other Ambulatory Visit: Payer: Self-pay | Admitting: Physician Assistant

## 2020-09-27 VITALS — BP 126/84 | HR 96 | Temp 98.1°F | Ht 66.5 in | Wt 136.2 lb

## 2020-09-27 DIAGNOSIS — G9332 Myalgic encephalomyelitis/chronic fatigue syndrome: Secondary | ICD-10-CM

## 2020-09-27 DIAGNOSIS — U099 Post covid-19 condition, unspecified: Secondary | ICD-10-CM | POA: Diagnosis not present

## 2020-09-27 DIAGNOSIS — R5382 Chronic fatigue, unspecified: Secondary | ICD-10-CM | POA: Diagnosis not present

## 2020-09-27 DIAGNOSIS — M25552 Pain in left hip: Secondary | ICD-10-CM | POA: Diagnosis not present

## 2020-09-27 DIAGNOSIS — D573 Sickle-cell trait: Secondary | ICD-10-CM | POA: Diagnosis not present

## 2020-09-27 DIAGNOSIS — E876 Hypokalemia: Secondary | ICD-10-CM | POA: Diagnosis not present

## 2020-09-27 NOTE — Patient Instructions (Signed)
Chronic Fatigue Syndrome Chronic fatigue syndrome (CFS) is a condition that causes extreme tiredness (fatigue). This condition is also known as myalgic encephalomyelitis (ME). The fatigue in CFS does not improve with rest, and it gets worse with physical or mental activity. Several other symptoms may occur along with fatigue. Symptoms may come and go, but they generally last for months. Sometimes, CFS gets better over time. In other cases, it can be a lifelong condition. There is no cure, but there are many possible treatments. You will need to work with your health care providers to find a treatment plan that works best for you. What are the causes? The cause of this condition is not known. CFS may be caused by a combination of things. Possible causes include:  An infection.  An abnormal body defense system (abnormal immune system).  Low blood pressure.  Poor diet.  Living with a lot of physical or emotional stress. What increases the risk? The following factors may make you more likely to develop this condition:  Being female.  Being 9-86 years old.  Having a family history of CFS. What are the signs or symptoms? The main symptom of this condition is fatigue that is severe enough to interfere with day-to-day activities. This fatigue does not get better with rest, and it gets worse with physical or mental activity. There are eight other major symptoms of CFS:  Discomfort and lack of energy (malaise) that lasts more than 24 hours after physical activity.  Sleep that does not relieve fatigue (unrefreshing sleep).  Short-term memory loss or confusion.  Joint pain without redness or swelling.  Muscle aches.  Headaches.  Painful and swollen glands (lymph nodes) in the neck or under the arms.  Sore throat. Other symptoms can include:  Cramps in the abdomen, constipation, or diarrhea (irritable bowel syndrome).  Chills and night sweats.  Vision changes.  Dizziness and  mental confusion (brain fog).  Clumsiness.  Sensitivity to food, noise, or odors.  Mood swings, depression, or anxiety attacks. How is this diagnosed? There are no tests that can diagnose this condition. Your health care provider will make the diagnosis based on:  Your symptoms and medical history.  A physical exam and a mental health exam.  Tests to rule out other conditions. It is important to make sure that your symptoms are not caused by another medical condition. Tests may include lab tests or X-rays. For your health care provider to diagnose CFS:  You must have had fatigue for at least 6 straight months.  Fatigue must be your first symptom, and it must be severe enough to interfere with day-to-day activities.  You must also have at least four of the eight other major symptoms of CFS.  There must be no other cause found for the fatigue. How is this treated? There is no cure for CFS. The condition affects everyone differently. You will need to work with your team of health care providers to find the best treatments for your symptoms. Your team may include your primary care provider, physical and exercise therapists, and mental health therapists. Treatment may include:  Having a regular bedtime routine to help improve your sleep.  Avoiding caffeine, alcohol, and tobacco or nicotine products.  Doing light exercise and stretching during the day. You may also want to try movement exercises, such as yoga or tai chi.  Taking medicines to help you sleep or to relieve joint or muscle pain.  Learning and practicing relaxation techniques, such as deep breathing and  muscle relaxation.  Using memory aids or doing brainteasers to improve memory and concentration.  Getting care for your body and mental well-being, such as: ? Seeing a mental health therapist to evaluate and treat depression, if necessary. ? Cognitive behavioral therapy (CBT). This therapy changes the way you think or  act in response to the fatigue. This may help improve how you feel. ? Trying massage therapy and acupuncture.   Follow these instructions at home: Eating and drinking  Avoid caffeine and alcohol.  Avoid heavy meals in the evening.  Eat a healthy diet that includes foods such as vegetables, fruits, fish, and lean meats.   Activity  Rest as told by your health care provider.  Avoid fatigue by pacing yourself during the day and getting enough sleep at night.  Exercise regularly, as told by your health care provider.  Go to bed and get up at the same time every day. Lifestyle  Ask your health care provider whether you should keep a diary. Your health care provider will tell you what information to write in the diary. This may include when you have fatigue and how medicines and other behaviors or treatments help to reduce the fatigue.  Consider joining a CFS support group.  Avoid stress and use stress-reducing techniques that you learn in therapy. General instructions  Take over-the-counter and prescription medicines only as told by your health care provider.  Do not use herbal or dietary supplements unless they are approved by your health care provider.  Maintain a healthy weight.  Do not use any products that contain nicotine or tobacco, such as cigarettes, e-cigarettes, and chewing tobacco. If you need help quitting, ask your health care provider.  Keep all follow-up visits as told by your health care provider. This is important.   Where to find more information Get more information or find a support group near you at one of these links:  American Myalgic Encephalomyelitis and Chronic Fatigue Syndrome Society: ammes.org  Centers for Disease Control and Prevention: http://www.wolf.info/ Contact a health care provider if:  Your symptoms do not get better or they get worse.  You feel angry, guilty, anxious, or depressed. Get help right away if:  You have thoughts of self-harm. If  you ever feel like you may hurt yourself or others, or have thoughts about taking your own life, get help right away. Go to your nearest emergency department or:  Call your local emergency services (911 in the [place]  Call a suicide crisis helpline, such as the [facility] at 832-169-6686. This is open 24 hours a day in the [place]  Text the Crisis Text Line at 7634573752 (in the [place].). Summary  Chronic fatigue syndrome (CFS) is a condition that causes extreme tiredness (fatigue). This fatigue does not improve with rest, and it gets worse with physical or mental activity.  There is no cure for CFS. The condition affects everyone differently. You will need to work with your team of health care providers to find the best treatments for your symptoms.  Exercise regularly, as told by your health care provider. Avoid stress and use stress-reducing techniques that you learn in therapy.  Contact a health care provider if your symptoms do not get better or they get worse. This information is not intended to replace advice given to you by your health care provider. Make sure you discuss any questions you have with your health care provider. Document Revised: 04/11/2019 Document Reviewed: 04/11/2019 Elsevier Patient Education  2021 Elsevier  Inc.

## 2020-09-28 ENCOUNTER — Encounter: Payer: Self-pay | Admitting: Family

## 2020-09-28 ENCOUNTER — Other Ambulatory Visit: Payer: Self-pay | Admitting: Family

## 2020-09-28 ENCOUNTER — Other Ambulatory Visit: Payer: Self-pay

## 2020-09-28 DIAGNOSIS — E876 Hypokalemia: Secondary | ICD-10-CM

## 2020-09-28 LAB — COMPREHENSIVE METABOLIC PANEL
ALT: 14 U/L (ref 0–35)
AST: 18 U/L (ref 0–37)
Albumin: 4.4 g/dL (ref 3.5–5.2)
Alkaline Phosphatase: 59 U/L (ref 39–117)
BUN: 14 mg/dL (ref 6–23)
CO2: 28 mEq/L (ref 19–32)
Calcium: 9.2 mg/dL (ref 8.4–10.5)
Chloride: 101 mEq/L (ref 96–112)
Creatinine, Ser: 0.81 mg/dL (ref 0.40–1.20)
GFR: 87.22 mL/min (ref >60.00)
Glucose, Bld: 81 mg/dL (ref 70–99)
Potassium: 3.3 mEq/L — ABNORMAL LOW (ref 3.5–5.1)
Sodium: 140 mEq/L (ref 135–145)
Total Bilirubin: 0.4 mg/dL (ref 0.2–1.2)
Total Protein: 7.8 g/dL (ref 6.0–8.3)

## 2020-09-28 LAB — CBC WITH DIFFERENTIAL/PLATELET
Basophils Absolute: 0.1 10*3/uL (ref 0.0–0.1)
Basophils Relative: 0.7 % (ref 0.0–3.0)
Eosinophils Absolute: 0.1 10*3/uL (ref 0.0–0.7)
Eosinophils Relative: 1.2 % (ref 0.0–5.0)
HCT: 36.7 % (ref 36.0–46.0)
Hemoglobin: 12.2 g/dL (ref 12.0–15.0)
Lymphocytes Relative: 23.3 % (ref 12.0–46.0)
Lymphs Abs: 1.8 10*3/uL (ref 0.7–4.0)
MCHC: 33.3 g/dL (ref 30.0–36.0)
MCV: 98.3 fl (ref 78.0–100.0)
Monocytes Absolute: 0.6 10*3/uL (ref 0.1–1.0)
Monocytes Relative: 7.8 % (ref 3.0–12.0)
Neutro Abs: 5.2 10*3/uL (ref 1.4–7.7)
Neutrophils Relative %: 67 % (ref 43.0–77.0)
Platelets: 302 10*3/uL (ref 150.0–400.0)
RBC: 3.74 Mil/uL — ABNORMAL LOW (ref 3.87–5.11)
RDW: 12.4 % (ref 11.5–15.5)
WBC: 7.8 10*3/uL (ref 4.0–10.5)

## 2020-09-28 LAB — THYROID PANEL WITH TSH
Free Thyroxine Index: 2 (ref 1.4–3.8)
T3 Uptake: 30 % (ref 22–35)
T4, Total: 6.7 ug/dL (ref 5.1–11.9)
TSH: 0.84 mIU/L

## 2020-09-28 MED ORDER — POTASSIUM CHLORIDE CRYS ER 20 MEQ PO TBCR: 20.0000 meq | EXTENDED_RELEASE_TABLET | Freq: Every day | ORAL | 0 refills | Status: DC

## 2020-09-28 NOTE — Progress Notes (Signed)
Acute Office Visit  Subjective:    Patient ID: Shirley Nunez, female    DOB: January 13, 1976, 45 y.o.   MRN: [id number]  Chief Complaint  Patient presents with  . Fatigue    Has gotten worse since first of April from covid.   . Hip Pain    Severe left hip pain, takes ibuprofen about 10 pills a day. Doans back medication helped hip a little. Thinks it is from covid   . Cough    Comes and goes, usually last about 2 weeks and then improves. Sometimes makes her vomit. Choking spells, random    HPI Patient is in today with persistent complaints of fatigue since she was diagnosed with COVID in Dec. She reports she has returned to work after being out for several months. However typically has to take at least 1 day off a week as result of chronic fatigue. Her breathing has improved.  Patient also reports having severe left hip pain that has not been responsive to Ibuprofen. She takes Doan's that helps but does not resolve the pain. Denies any injury. Pain has been ongoing x several weeks. No swelling. Worse with movement. No past history of hip pain.   At patient's previous office visits, she has been found to be hypokalemic between 2.9-3.3. Patient admits to not being compliant with following up with appointments. She denies are muscles aches.   Past Medical History:  Diagnosis Date  . Anxiety   . Depression   . HSV infection    on daily medication  . Migraine   . Sickle cell anemia (HCC)    Sickle cell trait    Past Surgical History:  Procedure Laterality Date  . WISDOM TOOTH EXTRACTION      Family History  Problem Relation Age of Onset  . Hypertension Mother   . Depression Mother   . Miscarriages / [place] Mother   . Hypertension Father   . Hyperlipidemia Father   . Diabetes Father   . Arthritis Father   . Stroke Father   . Sickle cell anemia Sister   . Alcohol abuse Maternal Grandmother   . Arthritis Maternal Grandmother   . Depression Maternal Grandmother   .  Diabetes Maternal Grandmother   . Heart attack Maternal Grandmother   . Hypertension Maternal Grandmother   . Stroke Maternal Grandmother   . Heart attack Maternal Grandfather   . Lung cancer Paternal Grandmother        mets to brain  . Alcohol abuse Paternal Grandfather   . Heart attack Paternal Grandfather   . Hypertension Paternal Grandfather   . Stroke Paternal Grandfather   . Colon cancer Neg Hx     Social History   Socioeconomic History  . Marital status: Single    Spouse name: Not on file  . Number of children: Not on file  . Years of education: Not on file  . Highest education level: Not on file  Occupational History  . Not on file  Tobacco Use  . Smoking status: Never Smoker  . Smokeless tobacco: Never Used  Vaping Use  . Vaping Use: Never used  Substance and Sexual Activity  . Alcohol use: Yes    Alcohol/week: 2.0 standard drinks    Types: 2 Glasses of wine per week  . Drug use: No  . Sexual activity: Yes    Birth control/protection: Other-see comments    Comment: ablation  Other Topics Concern  . Not on file  Social History Narrative  Relationship Freight forwarder at [employer]    Married   2 kids   Likes: writing and reading   One of her sons passed away 24 years ago   Social Determinants of Radio broadcast assistant Strain: Not on file  Food Insecurity: Not on file  Transportation Needs: Not on file  Physical Activity: Not on file  Stress: Not on file  Social Connections: Not on file  Intimate Partner Violence: Not on file    Outpatient Medications Prior to Visit  Medication Sig Dispense Refill  . albuterol (PROVENTIL) (2.5 MG/3ML) 0.083% nebulizer solution Take 3 mLs (2.5 mg total) by nebulization every 6 (six) hours as needed for wheezing or shortness of breath. 150 mL 1  . Cholecalciferol (VITAMIN D3 PO) Take by mouth.    . fluticasone (FLOVENT HFA) 44 MCG/ACT inhaler Inhale 1 puff into the lungs in the morning and at bedtime. 1 each 12  .  metroNIDAZOLE (METROGEL) 0.75 % vaginal gel Place vaginally.    . Multiple Vitamin (MULTIVITAMIN) tablet Take 1 tablet by mouth daily.    Marland Kitchen POTASSIUM PO Take by mouth.    . hydrochlorothiazide (HYDRODIURIL) 25 MG tablet Take 1 tablet by mouth once daily 90 tablet 0  . valACYclovir (VALTREX) 500 MG tablet Take 1 tablet (500 mg total) by mouth daily. 90 tablet 1   No facility-administered medications prior to visit.    Allergies  Allergen Reactions  . Mushroom Extract Complex Rash  . Penicillins Rash    Review of Systems  Constitutional: Positive for fatigue.       Fatigue x 6 months, post-covid  HENT: Negative.   Respiratory: Negative.   Cardiovascular: Negative.   Gastrointestinal: Negative.   Endocrine: Negative.   Musculoskeletal: Positive for arthralgias.       Left hip pain  Skin: Negative.   Neurological: Negative.   Psychiatric/Behavioral: Negative.        Objective:    Physical Exam Vitals reviewed.  Constitutional:      Appearance: Normal appearance.  HENT:     Head: Normocephalic and atraumatic.  Cardiovascular:     Rate and Rhythm: Normal rate and regular rhythm.  Pulmonary:     Effort: Pulmonary effort is normal.     Breath sounds: Normal breath sounds.  Abdominal:     General: Abdomen is flat.  Musculoskeletal:        General: Tenderness present. No deformity.     Cervical back: Normal range of motion and neck supple.     Left lower leg: No edema.     Comments: Left hip: Pain elicited with adduction and abduction of the hip. Tender to palpation. No swelling or obvious deformity.   Skin:    General: Skin is warm and dry.  Neurological:     General: No focal deficit present.     Mental Status: She is alert and oriented to person, place, and time.  Psychiatric:        Mood and Affect: Mood normal.        Behavior: Behavior normal.     BP 126/84   Pulse 96   Temp 98.1 F (36.7 C)   Ht 5' 6.5" (1.689 m)   Wt 136 lb 3.2 oz (61.8 kg)   SpO2 98%    BMI 21.65 kg/m  Wt Readings from Last 3 Encounters:  09/27/20 136 lb 3.2 oz (61.8 kg)  07/03/20 140 lb 6.1 oz (63.7 kg)  06/12/20 137 lb 8 oz (62.4 kg)  Health Maintenance Due  Topic Date Due  . COLONOSCOPY (Pts 45-48yr Insurance coverage will need to be confirmed)  Never done  . COVID-19 Vaccine (3 - Pfizer risk 4-dose series) 07/23/2020    There are no preventive care reminders to display for this patient.   Lab Results  Component Value Date   TSH 0.84 09/27/2020   Lab Results  Component Value Date   WBC 7.8 09/27/2020   HGB 12.2 09/27/2020   HCT 36.7 09/27/2020   MCV 98.3 09/27/2020   PLT 302.0 09/27/2020   Lab Results  Component Value Date   NA 140 09/27/2020   K 3.3 (L) 09/27/2020   CO2 28 09/27/2020   GLUCOSE 81 09/27/2020   BUN 14 09/27/2020   CREATININE 0.81 09/27/2020   BILITOT 0.4 09/27/2020   ALKPHOS 59 09/27/2020   AST 18 09/27/2020   ALT 14 09/27/2020   PROT 7.8 09/27/2020   ALBUMIN 4.4 09/27/2020   CALCIUM 9.2 09/27/2020   GFR 87.22 09/27/2020   Lab Results  Component Value Date   CHOL 189 09/14/2019   Lab Results  Component Value Date   HDL 86.30 09/14/2019   Lab Results  Component Value Date   LDLCALC 94 09/14/2019   Lab Results  Component Value Date   TRIG 45.0 09/14/2019   Lab Results  Component Value Date   CHOLHDL 2 09/14/2019   No results found for: HGBA1C     Assessment & Plan:   Problem List Items Addressed This Visit   None   Visit Diagnoses    COVID-19 long hauler manifesting chronic fatigue    -  Primary   Relevant Orders   Comp Met (CMET) (Completed)   CBC w/Diff (Completed)   Thyroid Panel With TSH (Completed)   Left hip pain       Relevant Orders   DG Hip Unilat W OR W/O Pelvis 2-3 Views Left   Hypokalemia          Labs obtained and xray, will notify patient pending results. Will discuss further treatment plan thereafter.    PKennyth Arnold FNP

## 2020-10-01 ENCOUNTER — Other Ambulatory Visit: Payer: Self-pay | Admitting: Family

## 2020-10-01 DIAGNOSIS — M1611 Unilateral primary osteoarthritis, right hip: Secondary | ICD-10-CM

## 2020-10-01 DIAGNOSIS — M25551 Pain in right hip: Secondary | ICD-10-CM

## 2020-10-11 ENCOUNTER — Telehealth: Payer: Self-pay

## 2020-10-11 ENCOUNTER — Ambulatory Visit (INDEPENDENT_AMBULATORY_CARE_PROVIDER_SITE_OTHER): Payer: BC Managed Care – PPO | Admitting: Orthopaedic Surgery

## 2020-10-11 ENCOUNTER — Encounter: Payer: Self-pay | Admitting: Orthopaedic Surgery

## 2020-10-11 DIAGNOSIS — M25552 Pain in left hip: Secondary | ICD-10-CM | POA: Diagnosis not present

## 2020-10-11 NOTE — Telephone Encounter (Signed)
Please advise 

## 2020-10-11 NOTE — Telephone Encounter (Signed)
Scheduled for 8:00 am on 10/15/20 as a work-in. I left this info on her voice mail and sent her a message through [facility]. Advised her to let us know if she cannot be here at that time.

## 2020-10-11 NOTE — Progress Notes (Signed)
Office Visit Note   Patient: Shirley Nunez           Date of Birth: 21-Aug-1975           MRN: [id number] Visit Date: 10/11/2020              Requested by: Shirley Arnold, FNP [place],  [place] [address] PCP: Shirley Nunez, [place]   Assessment & Plan: Visit Diagnoses:  1. Pain in left hip     Plan: Impression is labral tear left hip.  There is an incidental finding of os acetabuli on prior x-ray.  Based on findings treatment options were discussed and she would like to try cortisone injection in a few days which we helped her set up with Dr. Junius Nunez.  We will need to obtain MR arthrogram to assess for labral tear.  We will see her back after the MRI.  Follow-Up Instructions: Return for follow up after MRI.   Orders:  No orders of the defined types were placed in this encounter.  No orders of the defined types were placed in this encounter.     Procedures: No procedures performed   Clinical Data: No additional findings.   Subjective: Chief Complaint  Patient presents with  . Right Hip - Pain    Shirley Nunez is a 45 year old female who works in the [employer] comes in for severe left hip and groin pain for 3 to 4 months.  Denies any injuries.  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  Her pain is 8-9 out of 10 at all times.  Ibuprofen helps dull the pain for couple hours.  Denies any radicular symptoms.  She has significant weakness with hip flexion and moving her leg in the chair or car.   Review of Systems  Constitutional: Negative.   HENT: Negative.   Eyes: Negative.   Respiratory: Negative.   Cardiovascular: Negative.   Endocrine: Negative.   Musculoskeletal: Negative.   Neurological: Negative.   Hematological: Negative.   Psychiatric/Behavioral: Negative.   All other systems reviewed and are negative.    Objective: Vital Signs: There were no vitals taken for this visit.  Physical Exam Vitals and nursing note reviewed.   Constitutional:      Appearance: She is well-developed.  HENT:     Head: Normocephalic and atraumatic.  Pulmonary:     Effort: Pulmonary effort is normal.  Abdominal:     Palpations: Abdomen is soft.  Musculoskeletal:     Cervical back: Neck supple.  Skin:    General: Skin is warm.     Capillary Refill: Capillary refill takes less than 2 seconds.  Neurological:     Mental Status: She is alert and oriented to person, place, and time.  Psychiatric:        Behavior: Behavior normal.        Thought Content: Thought content normal.        Judgment: Judgment normal.     Ortho Exam Left hip shows significant pain with logroll and FADIR and FABER.  She is slightly tender to the lateral aspect of the hip.  No sciatic tension signs.  Significant pain and guarding to range of motion of the hip. Specialty Comments:  No specialty comments available.  Imaging: No results found.   PMFS History: Patient Active Problem List   Diagnosis Date Noted  . Pain in left hip 10/11/2020  . Depression, major, single episode, mild ([place]) 05/04/2018  . Insomnia  05/04/2018  . Migraine 07/16/2017  . Joint stiffness 11/09/2015  . Herpes simplex of female genitalia 08/16/2014  . Essential hypertension 07/12/2014  . Sickle cell trait ([place]) 07/12/2014   Past Medical History:  Diagnosis Date  . Anxiety   . Depression   . HSV infection    on daily medication  . Migraine   . Sickle cell anemia (HCC)    Sickle cell trait    Family History  Problem Relation Age of Onset  . Hypertension Mother   . Depression Mother   . Miscarriages / [place] Mother   . Hypertension Father   . Hyperlipidemia Father   . Diabetes Father   . Arthritis Father   . Stroke Father   . Sickle cell anemia Sister   . Alcohol abuse Maternal Grandmother   . Arthritis Maternal Grandmother   . Depression Maternal Grandmother   . Diabetes Maternal Grandmother   . Heart attack Maternal Grandmother   . Hypertension  Maternal Grandmother   . Stroke Maternal Grandmother   . Heart attack Maternal Grandfather   . Lung cancer Paternal Grandmother        mets to brain  . Alcohol abuse Paternal Grandfather   . Heart attack Paternal Grandfather   . Hypertension Paternal Grandfather   . Stroke Paternal Grandfather   . Colon cancer Neg Hx     Past Surgical History:  Procedure Laterality Date  . WISDOM TOOTH EXTRACTION     Social History   Occupational History  . Not on file  Tobacco Use  . Smoking status: Never Smoker  . Smokeless tobacco: Never Used  Vaping Use  . Vaping Use: Never used  Substance and Sexual Activity  . Alcohol use: Yes    Alcohol/week: 2.0 standard drinks    Types: 2 Glasses of wine per week  . Drug use: No  . Sexual activity: Yes    Birth control/protection: Other-see comments    Comment: ablation

## 2020-10-11 NOTE — Telephone Encounter (Signed)
Can arrive at 8:00 and we can do it prior to the wellness visit.

## 2020-10-11 NOTE — Telephone Encounter (Signed)
Patient seen by Dr Erlinda Hong today for left hip.  Dr Erlinda Hong would like patient to have hip injection. Patient is asking for first thing Monday morning if possible to be worked in.  Please call her to advise. [phone]. Thanks.

## 2020-10-12 ENCOUNTER — Other Ambulatory Visit (INDEPENDENT_AMBULATORY_CARE_PROVIDER_SITE_OTHER): Payer: BC Managed Care – PPO

## 2020-10-12 ENCOUNTER — Other Ambulatory Visit: Payer: Self-pay

## 2020-10-12 DIAGNOSIS — E876 Hypokalemia: Secondary | ICD-10-CM

## 2020-10-15 ENCOUNTER — Other Ambulatory Visit: Payer: Self-pay | Admitting: *Deleted

## 2020-10-15 ENCOUNTER — Ambulatory Visit: Payer: Self-pay

## 2020-10-15 ENCOUNTER — Other Ambulatory Visit: Payer: Self-pay

## 2020-10-15 ENCOUNTER — Ambulatory Visit (INDEPENDENT_AMBULATORY_CARE_PROVIDER_SITE_OTHER): Payer: BC Managed Care – PPO | Admitting: Family Medicine

## 2020-10-15 DIAGNOSIS — E876 Hypokalemia: Secondary | ICD-10-CM

## 2020-10-15 DIAGNOSIS — M25552 Pain in left hip: Secondary | ICD-10-CM

## 2020-10-15 LAB — POTASSIUM: Potassium: 3.1 meq/L — ABNORMAL LOW (ref 3.5–5.1)

## 2020-10-15 MED ORDER — HYDROCODONE-ACETAMINOPHEN 5-325 MG PO TABS: 1.0000 | ORAL_TABLET | Freq: Four times a day (QID) | ORAL | 0 refills | Status: DC | PRN

## 2020-10-15 NOTE — Progress Notes (Signed)
Subjective: Patient is here for ultrasound-guided intra-articular left hip injection.   Groin pain, can hardly walk.  MRI scheduled.  Objective:  Pain with IR.  Procedure: Ultrasound guided injection is preferred based studies that show increased duration, increased effect, greater accuracy, decreased procedural pain, increased response rate, and decreased cost with ultrasound guided versus blind injection.   Verbal informed consent obtained.  Time-out conducted.  Noted no overlying erythema, induration, or other signs of local infection. Ultrasound-guided left hip injection: After sterile prep with Betadine, injected 4 cc 0.25% bupivacaine without epinephrine and 6 mg betamethasone using a 22-gauge spinal needle, passing the needle through the iliofemoral ligament into the femoral head/neck junction.  Injectate seen filling joint capsule.  Good immediate relief.

## 2020-10-16 ENCOUNTER — Telehealth: Payer: Self-pay | Admitting: Family

## 2020-10-16 NOTE — Telephone Encounter (Signed)
Spoke to pt told her Abby Potash would like you to decrease HCTZ to 1/2 tablet. This is likely the issue with her potassium consistently running low. We may have to consider Spirolactone if potassium does not improve. Pt verbalized understanding.

## 2020-10-17 ENCOUNTER — Ambulatory Visit: Payer: BC Managed Care – PPO | Admitting: Orthopaedic Surgery

## 2020-10-24 ENCOUNTER — Ambulatory Visit
Admission: RE | Admit: 2020-10-24 | Discharge: 2020-10-24 | Disposition: A | Discharge status: Home / Self Care | Payer: BC Managed Care – PPO | Source: Ambulatory Visit | Attending: Orthopaedic Surgery | Admitting: Orthopaedic Surgery

## 2020-10-24 ENCOUNTER — Other Ambulatory Visit: Payer: Self-pay

## 2020-10-24 DIAGNOSIS — M25552 Pain in left hip: Secondary | ICD-10-CM

## 2020-10-24 DIAGNOSIS — S73192A Other sprain of left hip, initial encounter: Secondary | ICD-10-CM | POA: Diagnosis not present

## 2020-10-24 MED ORDER — IOPAMIDOL (ISOVUE-M 200) INJECTION 41%
15.0000 mL | Freq: Once | INTRAMUSCULAR | Status: AC
  Administered 2020-10-24: 15 mL via INTRA_ARTICULAR

## 2020-10-25 ENCOUNTER — Encounter: Payer: Self-pay | Admitting: Orthopaedic Surgery

## 2020-10-25 ENCOUNTER — Ambulatory Visit (INDEPENDENT_AMBULATORY_CARE_PROVIDER_SITE_OTHER): Payer: BC Managed Care – PPO | Admitting: Orthopaedic Surgery

## 2020-10-25 ENCOUNTER — Telehealth: Payer: Self-pay

## 2020-10-25 VITALS — Ht 66.5 in | Wt 136.0 lb

## 2020-10-25 DIAGNOSIS — S73192A Other sprain of left hip, initial encounter: Secondary | ICD-10-CM

## 2020-10-25 NOTE — Progress Notes (Signed)
Office Visit Note   Patient: Shirley Nunez           Date of Birth: 03/19/1976           MRN: [id number] Visit Date: 10/25/2020              Requested by: Inda Coke, [place] [address] [place],  [place] [address] PCP: Inda Coke, [place]   Assessment & Plan: Visit Diagnoses:  1. Tear of left acetabular labrum, initial encounter     Plan: MRI shows a nondisplaced anterior superior labral tear and mild chondrosis.  There is incidental finding of a right adnexal mass.  She does not appear to be having any symptoms from the incidental mass.  From the left hip standpoint she will continue to treat this with medications and activity modifications.  Activity restrictions were discussed today.  She is not interested in any surgeries and her pain seems to be relatively mild and acceptable.  She will follow-up with her OB/GYN about the adnexal mass.  She may come back periodically for cortisone injections in her left hip with Dr. Junius Roads if she needs them.  Follow-Up Instructions: Return if symptoms worsen or fail to improve.   Orders:  No orders of the defined types were placed in this encounter.  No orders of the defined types were placed in this encounter.     Procedures: No procedures performed   Clinical Data: No additional findings.   Subjective: Chief Complaint  Patient presents with   Left Hip - Follow-up    MRI review    Shirley Nunez returns today for follow-up of left hip MR arthrogram.  She received intra-articular injection by Dr. Junius Roads about 10 days ago and she has felt good relief from this.   Review of Systems   Objective: Vital Signs: Ht 5' 6.5" (1.689 m)   Wt 136 lb (61.7 kg)   BMI 21.62 kg/m   Physical Exam  Ortho Exam Left hip exam is unchanged. Specialty Comments:  No specialty comments available.  Imaging: MR Hip Left w/ contrast  Result Date: 10/25/2020 CLINICAL DATA:  MR arthrogram left hip eval labral tear EXAM: MRI OF THE LEFT HIP WITH  CONTRAST (MR Arthrogram) TECHNIQUE: Multiplanar, multisequence MR imaging of the hip was performed immediately following contrast injection into the hip joint under fluoroscopic guidance. No intravenous contrast was administered. COMPARISON:  Hip radiograph 09/27/2020 FINDINGS: Bones: There is no evidence of acute fracture, dislocation or avascular necrosis. There is a sclerotic of bone lesion in the right superior acetabulum, consistent with bone island. There is an additional T2 hyperintense lesion with possibly some internal fat in the left ischium measuring 1.1 cm (axial T2 fat-sat image 15). The visualized sacroiliac joints and symphysis pubis appear normal. Articular cartilage and labrum Articular cartilage:  Mild chondrosis. Labrum: Nondisplaced anterior superior labral tearing. Joint or bursal effusion Joint effusion: Adequate joint distension from arthrogram technique. Bursae: No focal periarticular fluid collection. Muscles and tendons Muscles and tendons: There is fluid signal at the and proximal insertion of the rectus femoris tendon and underlying bony edema. The visualized gluteus, hamstring and iliopsoas tendons appear normal. No muscle edema or atrophy. Other findings Miscellaneous: There is a septated right adnexal mixed cystic and solid mass which demonstrates heterogeneous T2 signal/low T1 signal anteriorly and T2 hyperintense/T1 isointense signal posteriorly with a possible fluid fluid level, measuring approximately 5.3 x 3.6 x 4.7 cm (axial T2 image 2, coronal T1 image 13). IMPRESSION: Nondisplaced anterior superior labral  tearing of the left hip. Mild left hip chondrosis. Partial tearing at the proximal insertion of the rectus femoris and associated reactive underlying bony edema. Incidental right adnexal mixed cystic and solid mass measuring 5.3 x 3.6 x 4.7 cm. This is incompletely evaluated without contrast. Recommend OBGYN referral and pelvic ultrasound and/or dedicated pelvic MRI with and  without contrast for further evaluation. Additionally, there is a T2 hyperintense bony lesion in the left ischium with benign features but technically indeterminate. This can also be further evaluated with contrast enhanced pelvic MRI as recommended above. These results will be called to the ordering clinician or representative by the Radiologist Assistant, and communication documented in the PACS or [employer]. Electronically Signed   By: Maurine Simmering   On: 10/25/2020 08:57   Arthrogram  Result Date: 10/24/2020 CLINICAL DATA:  Left hip pain EXAM: LEFT HIP INJECTION FOR MRI FLUOROSCOPY TIME:  Fluoroscopy Time:  10 seconds Radiation Exposure Index (if provided by the fluoroscopic device): 9.44 microGray*m^2 Number of Acquired Spot Images: 0 PROCEDURE: The overlying skin was prepped with Betadine, draped in the usual sterile fashion, and infiltrated locally with 1% lidocaine. A 3 inch 22 gauge spinal needle was advanced to the lateral aspect of the left femoral head-neck junction. 1 mL of 1% lidocaine injected easily. A mixture of 0.1 mL of MultiHance, 15 mL of Isovue-M 200, and 5 mL of sterile saline was then used to opacify the left hip joint. 14 mL of this mixture were injected. The needle was removed and a sterile dressing was applied. There was no immediate complication. IMPRESSION: Technically successful left hip injection under fluoroscopy for MR arthrogram. Electronically Signed   By: Logan Bores M.D.   On: 10/24/2020 16:21     PMFS History: Patient Active Problem List   Diagnosis Date Noted   Pain in left hip 10/11/2020   Depression, major, single episode, mild ([place]) 05/04/2018   Insomnia 05/04/2018   Migraine 07/16/2017   Joint stiffness 11/09/2015   Herpes simplex of female genitalia 08/16/2014   Essential hypertension 07/12/2014   Sickle cell trait ([place]) 07/12/2014   Past Medical History:  Diagnosis Date   Anxiety    Depression    HSV infection    on daily medication    Migraine    Sickle cell anemia (HCC)    Sickle cell trait    Family History  Problem Relation Age of Onset   Hypertension Mother    Depression Mother    Miscarriages / [place] Mother    Hypertension Father    Hyperlipidemia Father    Diabetes Father    Arthritis Father    Stroke Father    Sickle cell anemia Sister    Alcohol abuse Maternal Grandmother    Arthritis Maternal Grandmother    Depression Maternal Grandmother    Diabetes Maternal Grandmother    Heart attack Maternal Grandmother    Hypertension Maternal Grandmother    Stroke Maternal Grandmother    Heart attack Maternal Grandfather    Lung cancer Paternal Grandmother        mets to brain   Alcohol abuse Paternal Grandfather    Heart attack Paternal Grandfather    Hypertension Paternal Grandfather    Stroke Paternal Grandfather    Colon cancer Neg Hx     Past Surgical History:  Procedure Laterality Date   WISDOM TOOTH EXTRACTION     Social History   Occupational History   Not on file  Tobacco Use   Smoking  status: Never   Smokeless tobacco: Never  Vaping Use   Vaping Use: Never used  Substance and Sexual Activity   Alcohol use: Yes    Alcohol/week: 2.0 standard drinks    Types: 2 Glasses of wine per week   Drug use: No   Sexual activity: Yes    Birth control/protection: Other-see comments    Comment: ablation

## 2020-10-25 NOTE — Telephone Encounter (Signed)
Diane with [facility] wanted to let Dr. Erlinda Hong know that MRI results are in patient's chart.  Please advise.  Thank you.

## 2020-10-25 NOTE — Telephone Encounter (Signed)
Ok to do

## 2020-10-25 NOTE — Telephone Encounter (Signed)
FYI

## 2020-10-25 NOTE — Telephone Encounter (Signed)
Yes thanks 

## 2020-10-31 ENCOUNTER — Other Ambulatory Visit: Payer: Self-pay

## 2020-10-31 ENCOUNTER — Ambulatory Visit (INDEPENDENT_AMBULATORY_CARE_PROVIDER_SITE_OTHER): Payer: BC Managed Care – PPO | Admitting: Obstetrics and Gynecology

## 2020-10-31 ENCOUNTER — Encounter: Payer: Self-pay | Admitting: Obstetrics and Gynecology

## 2020-10-31 VITALS — BP 118/74 | HR 80 | Ht 65.5 in | Wt 137.0 lb

## 2020-10-31 DIAGNOSIS — N9489 Other specified conditions associated with female genital organs and menstrual cycle: Secondary | ICD-10-CM

## 2020-10-31 DIAGNOSIS — N912 Amenorrhea, unspecified: Secondary | ICD-10-CM | POA: Diagnosis not present

## 2020-10-31 DIAGNOSIS — Z9889 Other specified postprocedural states: Secondary | ICD-10-CM

## 2020-10-31 NOTE — Progress Notes (Signed)
45 y.o. No obstetric history on file. Single Black or African American Not Hispanic or Latino female referred here by Dr Erlinda Hong for an incidental finding of an adnexal mass.  An MRI of her hip on 10/24/20 showed the following incidental finding: Right adnexal mixed cystic and solid mass measuring 5.3 x 3.6 x 4.7 cm. This is incompletely evaluated without contrast. Recommend OBGYN referral and pelvic ultrasound and/or dedicated pelvic MRI with and without contrast for further evaluation.  MRI images reviewed.  She had an endometrial ablation ~8 years ago. No vaginal bleeding. She still gets menstrual like cramps 1-2 x a month. Typically she has a BM 4-5 x out of 7 days. Occasionally needs to strain.  She has some increased stress at work. Single mom.   Sexually active, same partner. No contraception. No pain.  She has some mild hot flashes and night sweats. Has night sweats ~1 x a month, hot flashes occur in clusters. about 4 x a month. Occasional vaginal dryness. Tolerable.   No LMP recorded. Patient has had an ablation.          Sexually active: Yes.    The current method of family planning is none.    Exercising: No.   Smoker:  no  Health Maintenance: Pap:  09/14/19 Normal, negative HR HPV History of abnormal Pap:  no MMG:  2020 cyst aspirated BMD:  NA Colonoscopy: N/A TDaP:  20 yrs Gardasil: N/A   reports that she has never smoked. She has never used smokeless tobacco. She reports current alcohol use of about 2.0 standard drinks of alcohol per week. She reports that she does not use drugs. She is a Occupational psychologist for [employer]. 3 kids, son is 43, daughter is 10. Oldest son died at age 61.  Past Medical History:  Diagnosis Date   Anxiety    Depression    HSV infection    on daily medication   Migraine    Sickle cell anemia (HCC)    Sickle cell trait    Past Surgical History:  Procedure Laterality Date   ENDOMETRIAL ABLATION     WISDOM TOOTH EXTRACTION      Current  Outpatient Medications  Medication Sig Dispense Refill   albuterol (PROVENTIL) (2.5 MG/3ML) 0.083% nebulizer solution Take 3 mLs (2.5 mg total) by nebulization every 6 (six) hours as needed for wheezing or shortness of breath. 150 mL 1   fluticasone (FLOVENT HFA) 44 MCG/ACT inhaler Inhale 1 puff into the lungs in the morning and at bedtime. 1 each 12   hydrochlorothiazide (HYDRODIURIL) 25 MG tablet Take 1 tablet by mouth once daily 30 tablet 0   metroNIDAZOLE (METROGEL) 0.75 % vaginal gel Place vaginally.     Multiple Vitamin (MULTIVITAMIN) tablet Take 1 tablet by mouth daily.     valACYclovir (VALTREX) 500 MG tablet Take 1 tablet by mouth once daily 30 tablet 0   Cholecalciferol (VITAMIN D3 PO) Take by mouth. (Patient not taking: Reported on 10/31/2020)     HYDROcodone-acetaminophen (NORCO/VICODIN) 5-325 MG tablet Take 1-2 tablets by mouth every 6 (six) hours as needed for moderate pain. (Patient not taking: Reported on 10/31/2020) 10 tablet 0   No current facility-administered medications for this visit.    Family History  Problem Relation Age of Onset   Hypertension Mother    Depression Mother    Miscarriages / [place] Mother    Hypertension Father    Hyperlipidemia Father    Diabetes Father    Arthritis Father  Stroke Father    Sickle cell anemia Sister    Alcohol abuse Maternal Grandmother    Arthritis Maternal Grandmother    Depression Maternal Grandmother    Diabetes Maternal Grandmother    Heart attack Maternal Grandmother    Hypertension Maternal Grandmother    Stroke Maternal Grandmother    Heart attack Maternal Grandfather    Lung cancer Paternal Grandmother        mets to brain   Alcohol abuse Paternal Grandfather    Heart attack Paternal Grandfather    Hypertension Paternal Grandfather    Stroke Paternal Grandfather    Colon cancer Neg Hx   Father with obesity.  Review of Systems: see above  Exam:   BP 118/74 (BP Location: Right Arm, Patient Position:  Sitting, Cuff Size: Normal)   Pulse 80   Ht 5' 5.5" (1.664 m)   Wt 137 lb (62.1 kg)   SpO2 98%   BMI 22.45 kg/m   Weight change: @WEIGHTCHANGE @ Height:   Height: 5' 5.5" (166.4 cm)  Ht Readings from Last 3 Encounters:  10/31/20 5' 5.5" (1.664 m)  10/25/20 5' 6.5" (1.689 m)  09/27/20 5' 6.5" (1.689 m)    General appearance: alert, cooperative and appears stated age Head: Normocephalic, without obvious abnormality, atraumatic Neck: no adenopathy, supple, symmetrical, trachea midline and thyroid normal to inspection and palpation Abdomen: soft, non-tender; non distended,  no masses,  no organomegaly Extremities: extremities normal, atraumatic, no cyanosis or edema Skin: Skin color, texture, turgor normal. No rashes or lesions Lymph nodes: Cervical, supraclavicular, and axillary nodes normal. No abnormal inguinal nodes palpated Neurologic: Grossly normal   Pelvic: External genitalia:  no lesions              Urethra:  normal appearing urethra with no masses, tenderness or lesions              Bartholins and Skenes: normal                 Vagina: normal appearing vagina with normal color and discharge, no lesions              Cervix: no lesions               Bimanual Exam:  Uterus:  normal size, contour, position, consistency, mobility, non-tender              Adnexa: no mass, fullness, tenderness               Rectovaginal: Confirms               Anus:  normal sphincter tone, no lesions  Gae Dry chaperoned for the exam.  1. Adnexal mass Incidental finding on MRI (without contrast), further imaging recommended. No findings on exam, no symptoms - US PELVIS TRANSVAGINAL NON-OB (TV ONLY); Future  2. History of endometrial ablation No menses. We discussed need for contraception. After the exam she was thinking about contraception and is almost sure she had a tubal ligation after the birth of her daughter in 9/11. Will try and get records and up date the chart  3.  Amenorrhea Since her ablation She has mild vasomotor symptoms Well estrogenized vaginal mucosa.

## 2020-11-01 ENCOUNTER — Other Ambulatory Visit: Payer: Self-pay | Admitting: Physician Assistant

## 2020-11-09 ENCOUNTER — Other Ambulatory Visit: Payer: Self-pay | Admitting: Family Medicine

## 2020-11-26 NOTE — Progress Notes (Signed)
GYNECOLOGY  VISIT   HPI: 45 y.o.   Single Black or African American Not Hispanic or Latino  female   [phone] with No LMP recorded. Patient has had an ablation.   here for ultrasound consult after incidental findings of adnexal mass on a hip MRI last month.  MRI: Incidental right adnexal mixed cystic and solid mass measuring 5.3 x 3.6 x 4.7 cm. This is incompletely evaluated without contrast. Recommend OBGYN referral and pelvic ultrasound and/or dedicated pelvic MRI with and without contrast for further evaluation.  GYNECOLOGIC HISTORY: No LMP recorded. Patient has had an ablation. Contraception:none had an ablation  Menopausal hormone therapy: none         OB History     Gravida  6   Para  3   Term      Preterm      AB  3   Living  3      SAB  2   IAB  1   Ectopic      Multiple      Live Births                 Patient Active Problem List   Diagnosis Date Noted   Pain in left hip 10/11/2020   Depression, major, single episode, mild ([place]) 05/04/2018   Insomnia 05/04/2018   Migraine 07/16/2017   Joint stiffness 11/09/2015   Herpes simplex of female genitalia 08/16/2014   Essential hypertension 07/12/2014   Sickle cell trait ([place]) 07/12/2014    Past Medical History:  Diagnosis Date   Anxiety    Depression    HSV infection    on daily medication   Migraine    Sickle cell anemia (HCC)    Sickle cell trait    Past Surgical History:  Procedure Laterality Date   ENDOMETRIAL ABLATION     WISDOM TOOTH EXTRACTION      Current Outpatient Medications  Medication Sig Dispense Refill   albuterol (PROVENTIL) (2.5 MG/3ML) 0.083% nebulizer solution Take 3 mLs (2.5 mg total) by nebulization every 6 (six) hours as needed for wheezing or shortness of breath. 150 mL 1   Cholecalciferol (VITAMIN D3 PO) Take by mouth.     fluticasone (FLOVENT HFA) 44 MCG/ACT inhaler Inhale 1 puff into the lungs in the morning and at bedtime. 1 each 12   hydrochlorothiazide  (HYDRODIURIL) 25 MG tablet Take 1 tablet by mouth once daily 30 tablet 0   metroNIDAZOLE (METROGEL) 0.75 % vaginal gel Place vaginally.     Multiple Vitamin (MULTIVITAMIN) tablet Take 1 tablet by mouth daily.     valACYclovir (VALTREX) 500 MG tablet Take 1 tablet by mouth once daily 30 tablet 0   No current facility-administered medications for this visit.     ALLERGIES: Mushroom extract complex and Penicillins  Family History  Problem Relation Age of Onset   Hypertension Mother    Depression Mother    Miscarriages / [place] Mother    Hypertension Father    Hyperlipidemia Father    Diabetes Father    Arthritis Father    Stroke Father    Sickle cell anemia Sister    Alcohol abuse Maternal Grandmother    Arthritis Maternal Grandmother    Depression Maternal Grandmother    Diabetes Maternal Grandmother    Heart attack Maternal Grandmother    Hypertension Maternal Grandmother    Stroke Maternal Grandmother    Heart attack Maternal Grandfather    Lung cancer Paternal Grandmother  mets to brain   Alcohol abuse Paternal Grandfather    Heart attack Paternal Grandfather    Hypertension Paternal Grandfather    Stroke Paternal Grandfather    Colon cancer Neg Hx     Social History   Socioeconomic History   Marital status: Single    Spouse name: Not on file   Number of children: Not on file   Years of education: Not on file   Highest education level: Not on file  Occupational History   Not on file  Tobacco Use   Smoking status: Never   Smokeless tobacco: Never  Vaping Use   Vaping Use: Never used  Substance and Sexual Activity   Alcohol use: Yes    Alcohol/week: 2.0 standard drinks    Types: 2 Glasses of wine per week   Drug use: No   Sexual activity: Yes    Birth control/protection: Other-see comments, None    Comment: ablation  Other Topics Concern   Not on file  Social History Narrative   Loss adjuster, chartered at [employer]    Married   2 kids   Likes:  writing and reading   One of her sons passed away 55 years ago   Social Determinants of Radio broadcast assistant Strain: Not on file  Food Insecurity: Not on file  Transportation Needs: Not on file  Physical Activity: Not on file  Stress: Not on file  Social Connections: Not on file  Intimate Partner Violence: Not on file    Review of Systems  All other systems reviewed and are negative.  PHYSICAL EXAMINATION:    BP 130/88   Pulse 77   Ht 5' 5.5" (1.664 m)   Wt 136 lb 12.8 oz (62.1 kg)   SpO2 98%   BMI 22.42 kg/m     General appearance: alert, cooperative and appears stated age  Pelvic ultrasound  Indications: right adnexal mass noted on MRI of hip  Findings:  Anteverted Uterus 7.89 x 4.18 x 3.47 cm  2 intramural myomas noted: 1.54 x 1.36 cm and 1.09 x 0.9 cm  Endometrium 2.52 mm, symmetrical, no masses  Left ovary 1.98 x 1.25 x 0.89 cm Prominent left adnexal vessels  Right ovary 2.28 x 1.51 x 1.66 cm  No free fluid   Impression:  Normal sized anteverted uterus with 2 small intramural myomas Thin, symmetrical endometrium Normal adnexa bilaterally   1. Adnexal mass Seen on MRI, not seen on ultrasound today.  Will call the Radiologist who read the MRI to see if t what they described could have been a hemorrhagic CL  2. History of endometrial ablation

## 2020-11-27 ENCOUNTER — Other Ambulatory Visit: Payer: Self-pay

## 2020-11-27 ENCOUNTER — Ambulatory Visit (INDEPENDENT_AMBULATORY_CARE_PROVIDER_SITE_OTHER): Payer: BC Managed Care – PPO | Admitting: Obstetrics and Gynecology

## 2020-11-27 ENCOUNTER — Ambulatory Visit (INDEPENDENT_AMBULATORY_CARE_PROVIDER_SITE_OTHER): Payer: BC Managed Care – PPO

## 2020-11-27 VITALS — BP 130/88 | HR 77 | Ht 65.5 in | Wt 136.8 lb

## 2020-11-27 DIAGNOSIS — Z9889 Other specified postprocedural states: Secondary | ICD-10-CM

## 2020-11-27 DIAGNOSIS — N9489 Other specified conditions associated with female genital organs and menstrual cycle: Secondary | ICD-10-CM

## 2020-11-30 ENCOUNTER — Encounter: Payer: Self-pay | Admitting: Obstetrics and Gynecology

## 2020-12-04 ENCOUNTER — Telehealth: Payer: Self-pay | Admitting: Obstetrics and Gynecology

## 2020-12-04 NOTE — Telephone Encounter (Signed)
-----   Message from Ma Rings, MD sent at 12/03/2020  1:29 PM EDT ----- Warden Fillers,  I apologize for the delay in response.  I reviewed the ultrasound and prior MRI. I agree this it likely a resolved/resolving hemorrhagic cyst. It is helpful diagnostically that this resolved within 6 weeks, which is characteristic. No further concerns on my end.   Thank you for the follow up.  Edison Nasuti  ----- Message ----- From: Salvadore Dom, MD Sent: 11/30/2020   5:09 PM EDT To: Ma Rings, MD, Salvadore Dom, MD  Shirley Nunez, [name] was referred to me for evaluation of the adnexal mass noted on her hip MRI on 10/25/20. Ultrasound is normal, no adnexal mass was seen and her right ovary was clearly identified. I just wanted to check if the mass you saw could be consistent with a hemorrhagic CL?  Thank you, Sharee Pimple

## 2020-12-04 NOTE — Telephone Encounter (Signed)
Please let the patient know that the Radiologist doesn't feel she needs any further imaging.

## 2021-01-21 ENCOUNTER — Encounter: Payer: Self-pay | Admitting: Physician Assistant

## 2021-01-21 ENCOUNTER — Ambulatory Visit (INDEPENDENT_AMBULATORY_CARE_PROVIDER_SITE_OTHER): Payer: BC Managed Care – PPO | Admitting: Physician Assistant

## 2021-01-21 ENCOUNTER — Other Ambulatory Visit: Payer: Self-pay

## 2021-01-21 VITALS — BP 142/79 | HR 77 | Temp 97.7°F | Ht 65.5 in | Wt 144.6 lb

## 2021-01-21 DIAGNOSIS — F320 Major depressive disorder, single episode, mild: Secondary | ICD-10-CM | POA: Diagnosis not present

## 2021-01-21 DIAGNOSIS — I10 Essential (primary) hypertension: Secondary | ICD-10-CM

## 2021-01-21 DIAGNOSIS — A6009 Herpesviral infection of other urogenital tract: Secondary | ICD-10-CM | POA: Diagnosis not present

## 2021-01-21 LAB — BASIC METABOLIC PANEL
BUN: 13 mg/dL (ref 6–23)
CO2: 29 mEq/L (ref 19–32)
Calcium: 9 mg/dL (ref 8.4–10.5)
Chloride: 104 mEq/L (ref 96–112)
Creatinine, Ser: 0.73 mg/dL (ref 0.40–1.20)
GFR: 98.59 mL/min (ref >60.00)
Glucose, Bld: 82 mg/dL (ref 70–99)
Potassium: 3.7 mEq/L (ref 3.5–5.1)
Sodium: 141 mEq/L (ref 135–145)

## 2021-01-21 MED ORDER — HYDROCHLOROTHIAZIDE 25 MG PO TABS: 25.0000 mg | ORAL_TABLET | Freq: Every day | ORAL | 1 refills | Status: DC

## 2021-01-21 MED ORDER — CLONAZEPAM 0.5 MG PO TABS: 0.5000 mg | ORAL_TABLET | Freq: Two times a day (BID) | ORAL | 1 refills | Status: DC | PRN

## 2021-01-21 MED ORDER — ESCITALOPRAM OXALATE 10 MG PO TABS: 10.0000 mg | ORAL_TABLET | Freq: Every day | ORAL | 1 refills | Status: DC

## 2021-01-21 MED ORDER — VALACYCLOVIR HCL 500 MG PO TABS: 500.0000 mg | ORAL_TABLET | Freq: Every day | ORAL | 2 refills | Status: DC
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
## 2021-01-21 NOTE — Patient Instructions (Signed)
It was great to see you!  [email or web address] [employer] -- therapist recommended by Vertis Kelch Lexapro 10 mg daily and as needed klonopin  Keep closer tabs on your blood pressure -- you may need to take the hydrochlorothiazide  Let me know what you would like for me to do regarding FMLA  Take care,  Inda Coke PA-C

## 2021-01-23 ENCOUNTER — Ambulatory Visit: Payer: BC Managed Care – PPO | Admitting: Physician Assistant

## 2021-01-23 DIAGNOSIS — N951 Menopausal and female climacteric states: Secondary | ICD-10-CM | POA: Diagnosis not present

## 2021-01-31 DIAGNOSIS — N951 Menopausal and female climacteric states: Secondary | ICD-10-CM | POA: Diagnosis not present

## 2021-01-31 DIAGNOSIS — R6882 Decreased libido: Secondary | ICD-10-CM | POA: Diagnosis not present

## 2021-01-31 DIAGNOSIS — Z6822 Body mass index (BMI) 22.0-22.9, adult: Secondary | ICD-10-CM | POA: Diagnosis not present

## 2021-01-31 DIAGNOSIS — R232 Flushing: Secondary | ICD-10-CM | POA: Diagnosis not present

## 2021-02-01 ENCOUNTER — Telehealth: Payer: Self-pay | Admitting: *Deleted

## 2021-02-01 MED ORDER — CITALOPRAM HYDROBROMIDE 10 MG PO TABS: 10.0000 mg | ORAL_TABLET | Freq: Every day | ORAL | 1 refills | Status: DC

## 2021-02-01 NOTE — Telephone Encounter (Signed)
Spoke to pt regarding FMLA paperwork received. Asked pt if she is working? Pt said no. Asked her when did you go out and when do you plan to go back to work? Pt said she has been out since the 9/9 and told work possible come back on 02/25/2021. Pt said she does not feel Lexapro is working. She said it was okay the first day or two but now she is sleeping all day and sweating profusely, no appetite, nausea and not being able to sleep at night. She said she is only taking 1/2 of Klonopin due to sleeping all day. Pt said she needs something that she can function during the day so she can return to work. Told pt I will send message to [facility] and see what she says and get back to you. Pt verbalized understanding.

## 2021-02-01 NOTE — Telephone Encounter (Signed)
Tried to contact pt, no answer unable to leave message mailbox was full. My Chart message sent and Rx sent to pharmacy.

## 2021-02-01 NOTE — Telephone Encounter (Signed)
Please see message and advise. FMLA paperwork put in your folder.

## 2021-02-05 ENCOUNTER — Telehealth: Payer: Self-pay | Admitting: *Deleted

## 2021-02-05 NOTE — Telephone Encounter (Signed)
Spoke to pt told her FMLA paperwork was completed and faxed over to [place]. Pt verbalized understanding.
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
## 2021-02-14 ENCOUNTER — Other Ambulatory Visit: Payer: Self-pay

## 2021-02-14 ENCOUNTER — Ambulatory Visit (INDEPENDENT_AMBULATORY_CARE_PROVIDER_SITE_OTHER): Payer: BC Managed Care – PPO | Admitting: Physician Assistant

## 2021-02-14 ENCOUNTER — Encounter: Payer: Self-pay | Admitting: Physician Assistant

## 2021-02-14 VITALS — BP 140/90 | HR 77 | Temp 98.1°F | Ht 65.5 in | Wt 146.0 lb

## 2021-02-14 DIAGNOSIS — F320 Major depressive disorder, single episode, mild: Secondary | ICD-10-CM | POA: Diagnosis not present

## 2021-02-14 NOTE — Patient Instructions (Addendum)
It was great to see you!  Continue Celexa 10 mg daily and klonopin as needed  Have your FMLA re-sent to [place]  Discuss with your manager about possibility of working from home in future?  Let's follow-up the week of November 14th, sooner if you have concerns.  Take care,  Inda Coke PA-C   Wt Readings from Last 3 Encounters:  02/14/21 146 lb (66.2 kg)  01/21/21 144 lb 9.6 oz (65.6 kg)  11/27/20 136 lb 12.8 oz (62.1 kg)

## 2021-02-15 ENCOUNTER — Ambulatory Visit: Payer: BC Managed Care – PPO | Admitting: Physician Assistant

## 2021-02-22 ENCOUNTER — Ambulatory Visit: Payer: BC Managed Care – PPO | Admitting: Physician Assistant

## 2021-02-26 ENCOUNTER — Telehealth: Payer: Self-pay | Admitting: *Deleted

## 2021-02-26 NOTE — Telephone Encounter (Signed)
Received fax from [facility] paperwork for pt to be completed. Paperwork completed and faxed to [place] at [phone].

## 2021-03-04 ENCOUNTER — Other Ambulatory Visit: Payer: Self-pay | Admitting: Physician Assistant

## 2021-03-06 DIAGNOSIS — N951 Menopausal and female climacteric states: Secondary | ICD-10-CM | POA: Diagnosis not present

## 2021-03-14 ENCOUNTER — Encounter: Payer: Self-pay | Admitting: Physician Assistant

## 2021-03-21 DIAGNOSIS — Z6823 Body mass index (BMI) 23.0-23.9, adult: Secondary | ICD-10-CM | POA: Diagnosis not present

## 2021-03-21 DIAGNOSIS — N951 Menopausal and female climacteric states: Secondary | ICD-10-CM | POA: Diagnosis not present

## 2021-03-21 DIAGNOSIS — R6882 Decreased libido: Secondary | ICD-10-CM | POA: Diagnosis not present

## 2021-03-21 DIAGNOSIS — R232 Flushing: Secondary | ICD-10-CM | POA: Diagnosis not present

## 2021-03-26 NOTE — Progress Notes (Signed)
Shirley Nunez is a 45 y.o. female here for a follow up of anxiety and depression.   History of Present Illness:   Chief Complaint  Patient presents with   Anxiety   Depression    HPI  Anxiety/ Depression Shirley Nunez expresses that although we agreed on continuing the celexa 10 mg, she found she couldn't go to pick up the prescription. States she found it difficult due to not wanting to be dependent on medication feeling the fact that she didn't like how  "blah" she felt while taking the medication. Despite being non compliant with the celexa, she has been trialing a herbal remedy recommended by a friend called St. John's Wort. Shirley Nunez found this to be very beneficial to her since it allows her to feel more empowered and less dependent.   In addition to stopping her celexa she has also stopped taking the klonopin 0.5 mg for the same feeling of not wanting to need medication to function. She says she has tried melatonin and other remedies but still found herself waking up in the middle of the night. At this time she has come to terms that she needs to medication to get a good night's sleep.   Despite this ongoing issue, she has been participating in talk therapy for about 5 weeks. Shirley Nunez has found it beneficial in terms of returning back to her normal schedule. Currently she is working her self up to going back to work but is still trying to find the best time. Denies SI/HI.     Past Medical History:  Diagnosis Date   Anxiety    Depression    HSV infection    on daily medication   Migraine    Sickle cell anemia (HCC)    Sickle cell trait     Social History   Tobacco Use   Smoking status: Never   Smokeless tobacco: Never  Vaping Use   Vaping Use: Never used  Substance Use Topics   Alcohol use: Yes    Alcohol/week: 2.0 standard drinks    Types: 2 Glasses of wine per week   Drug use: No    Past Surgical History:  Procedure Laterality Date   ENDOMETRIAL ABLATION     WISDOM TOOTH  EXTRACTION      Family History  Problem Relation Age of Onset   Hypertension Mother    Depression Mother    Miscarriages / [place] Mother    Hypertension Father    Hyperlipidemia Father    Diabetes Father    Arthritis Father    Stroke Father    Sickle cell anemia Sister    Alcohol abuse Maternal Grandmother    Arthritis Maternal Grandmother    Depression Maternal Grandmother    Diabetes Maternal Grandmother    Heart attack Maternal Grandmother    Hypertension Maternal Grandmother    Stroke Maternal Grandmother    Heart attack Maternal Grandfather    Lung cancer Paternal Grandmother        mets to brain   Alcohol abuse Paternal Grandfather    Heart attack Paternal Grandfather    Hypertension Paternal Grandfather    Stroke Paternal Grandfather    Colon cancer Neg Hx     Allergies  Allergen Reactions   Mushroom Extract Complex Rash   Penicillins Rash    Current Medications:   Current Outpatient Medications:    Cholecalciferol (VITAMIN D3 PO), Take 1,000 Units by mouth daily in the afternoon., Disp: , Rfl:    clonazePAM (  KLONOPIN) 0.5 MG tablet, Take 1 tablet by mouth twice daily as needed for anxiety, Disp: 20 tablet, Rfl: 0   hydrochlorothiazide (HYDRODIURIL) 25 MG tablet, Take 1 tablet (25 mg total) by mouth daily., Disp: 90 tablet, Rfl: 1   Multiple Vitamin (MULTIVITAMIN) tablet, Take 1 tablet by mouth daily., Disp: , Rfl:    St Johns Wort 300 MG TABS, Take 1 tablet by mouth daily in the afternoon., Disp: , Rfl:    valACYclovir (VALTREX) 500 MG tablet, Take 1 tablet (500 mg total) by mouth daily., Disp: 90 tablet, Rfl: 2   vitamin B-12 (CYANOCOBALAMIN) 1000 MCG tablet, Take 1,000 mcg by mouth daily., Disp: , Rfl:    Review of Systems:   ROS Negative unless otherwise specified per HPI. Vitals:   Vitals:   03/27/21 1059  BP: 122/90  Pulse: 87  Temp: 98.4 F (36.9 C)  TempSrc: Temporal  SpO2: 96%  Weight: 145 lb 8 oz (66 kg)  Height: 5' 5.5" (1.664 m)      Body mass index is 23.84 kg/m.  Physical Exam:   Physical Exam Vitals and nursing note reviewed.  Constitutional:      General: She is not in acute distress.    Appearance: She is well-developed. She is not ill-appearing or toxic-appearing.  Cardiovascular:     Rate and Rhythm: Normal rate and regular rhythm.     Pulses: Normal pulses.     Heart sounds: Normal heart sounds, S1 normal and S2 normal.  Pulmonary:     Effort: Pulmonary effort is normal.     Breath sounds: Normal breath sounds.  Skin:    General: Skin is warm and dry.  Neurological:     Mental Status: She is alert.     GCS: GCS eye subscore is 4. GCS verbal subscore is 5. GCS motor subscore is 6.  Psychiatric:        Speech: Speech normal.        Behavior: Behavior normal. Behavior is cooperative.    Assessment and Plan:   Depression, major, single episode, mild ([place]) Improved Return to work date set for Dec 1 Continue talk therapy Klonopin 0.5 mg prn Follow-up in 3-6 months  I,Havlyn C Ratchford,acting as a Education administrator for [employer], PA.,have documented all relevant documentation on the behalf of Inda Coke, PA,as directed by  Inda Coke, PA while in the presence of Inda Coke, [place].  I, Inda Coke, [place], have reviewed all documentation for this visit. The documentation on 03/27/21 for the exam, diagnosis, procedures, and orders are all accurate and complete.   Inda Coke, PA-C

## 2021-03-27 ENCOUNTER — Ambulatory Visit (INDEPENDENT_AMBULATORY_CARE_PROVIDER_SITE_OTHER): Payer: BC Managed Care – PPO | Admitting: Physician Assistant

## 2021-03-27 ENCOUNTER — Encounter: Payer: Self-pay | Admitting: Physician Assistant

## 2021-03-27 ENCOUNTER — Other Ambulatory Visit: Payer: Self-pay

## 2021-03-27 VITALS — BP 122/90 | HR 87 | Temp 98.4°F | Ht 65.5 in | Wt 145.5 lb

## 2021-03-27 DIAGNOSIS — F320 Major depressive disorder, single episode, mild: Secondary | ICD-10-CM | POA: Diagnosis not present

## 2021-03-27 NOTE — Patient Instructions (Signed)
It was great to see you!  Let's have you potentially return on December 1.  Let's follow-up in 3-6 months, sooner if you have concerns.  Take care,  Inda Coke PA-C

## 2021-04-05 ENCOUNTER — Other Ambulatory Visit: Payer: Self-pay | Admitting: Physician Assistant

## 2021-04-08 MED ORDER — CLONAZEPAM 0.5 MG PO TABS: 0.5000 mg | ORAL_TABLET | Freq: Two times a day (BID) | ORAL | 0 refills | Status: DC | PRN

## 2021-05-15 DIAGNOSIS — F430 Acute stress reaction: Secondary | ICD-10-CM | POA: Diagnosis not present

## 2021-05-15 DIAGNOSIS — F320 Major depressive disorder, single episode, mild: Secondary | ICD-10-CM | POA: Diagnosis not present

## 2021-05-23 DIAGNOSIS — F430 Acute stress reaction: Secondary | ICD-10-CM | POA: Diagnosis not present

## 2021-05-23 DIAGNOSIS — F320 Major depressive disorder, single episode, mild: Secondary | ICD-10-CM | POA: Diagnosis not present

## 2021-05-27 DIAGNOSIS — F430 Acute stress reaction: Secondary | ICD-10-CM | POA: Diagnosis not present

## 2021-05-27 DIAGNOSIS — F320 Major depressive disorder, single episode, mild: Secondary | ICD-10-CM | POA: Diagnosis not present

## 2021-06-03 DIAGNOSIS — F320 Major depressive disorder, single episode, mild: Secondary | ICD-10-CM | POA: Diagnosis not present

## 2021-06-03 DIAGNOSIS — F430 Acute stress reaction: Secondary | ICD-10-CM | POA: Diagnosis not present

## 2021-07-08 ENCOUNTER — Encounter: Payer: Self-pay | Admitting: Physician Assistant

## 2021-07-09 ENCOUNTER — Encounter: Payer: Self-pay | Admitting: Physician Assistant

## 2021-07-09 ENCOUNTER — Other Ambulatory Visit: Payer: Self-pay

## 2021-07-09 ENCOUNTER — Ambulatory Visit (INDEPENDENT_AMBULATORY_CARE_PROVIDER_SITE_OTHER): Payer: BC Managed Care – PPO | Admitting: Physician Assistant

## 2021-07-09 VITALS — BP 140/90 | HR 89 | Temp 98.2°F | Ht 66.0 in | Wt 137.2 lb

## 2021-07-09 DIAGNOSIS — Z13220 Encounter for screening for lipoid disorders: Secondary | ICD-10-CM | POA: Diagnosis not present

## 2021-07-09 DIAGNOSIS — Z136 Encounter for screening for cardiovascular disorders: Secondary | ICD-10-CM | POA: Diagnosis not present

## 2021-07-09 DIAGNOSIS — I10 Essential (primary) hypertension: Secondary | ICD-10-CM | POA: Diagnosis not present

## 2021-07-09 DIAGNOSIS — Z0000 Encounter for general adult medical examination without abnormal findings: Secondary | ICD-10-CM | POA: Diagnosis not present

## 2021-07-09 DIAGNOSIS — F320 Major depressive disorder, single episode, mild: Secondary | ICD-10-CM

## 2021-07-09 DIAGNOSIS — F418 Other specified anxiety disorders: Secondary | ICD-10-CM

## 2021-07-09 NOTE — Patient Instructions (Signed)
It was great to see you! ? ?Please go to the lab for blood work.  ? ?Our office will call you with your results unless you have chosen to receive results via MyChart. ? ?If your blood work is normal we will follow-up each year for physicals and as scheduled for chronic medical problems. ? ?If anything is abnormal we will treat accordingly and get you in for a follow-up. ? ?Take care, ? ?Shirley Nunez ?  ? ? ?

## 2021-07-09 NOTE — Telephone Encounter (Signed)
Spoke to pt told her received form for Wellness visit. Told pt she has not had cholesterol checked since 2021 and is overdue for physical. Pt verbalized understanding and asked if openings today? Told pt yes at 3:30. Pt said to schedule her. Appt scheduled for today at 3:30 with [facility]. Pt verbalized understanding.
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
## 2021-07-10 LAB — CBC WITH DIFFERENTIAL/PLATELET
Basophils Absolute: 0 10*3/uL (ref 0.0–0.1)
Basophils Relative: 0.7 % (ref 0.0–3.0)
Eosinophils Absolute: 0.1 10*3/uL (ref 0.0–0.7)
Eosinophils Relative: 1.1 % (ref 0.0–5.0)
HCT: 36.7 % (ref 36.0–46.0)
Hemoglobin: 12.1 g/dL (ref 12.0–15.0)
Lymphocytes Relative: 32.9 % (ref 12.0–46.0)
Lymphs Abs: 1.9 10*3/uL (ref 0.7–4.0)
MCHC: 32.9 g/dL (ref 30.0–36.0)
MCV: 97.6 fl (ref 78.0–100.0)
Monocytes Absolute: 0.5 10*3/uL (ref 0.1–1.0)
Monocytes Relative: 8.7 % (ref 3.0–12.0)
Neutro Abs: 3.2 10*3/uL (ref 1.4–7.7)
Neutrophils Relative %: 56.6 % (ref 43.0–77.0)
Platelets: 293 10*3/uL (ref 150.0–400.0)
RBC: 3.76 Mil/uL — ABNORMAL LOW (ref 3.87–5.11)
RDW: 11.9 % (ref 11.5–15.5)
WBC: 5.7 10*3/uL (ref 4.0–10.5)

## 2021-07-10 LAB — COMPREHENSIVE METABOLIC PANEL
ALT: 10 U/L (ref 0–35)
AST: 14 U/L (ref 0–37)
Albumin: 4.6 g/dL (ref 3.5–5.2)
Alkaline Phosphatase: 68 U/L (ref 39–117)
BUN: 13 mg/dL (ref 6–23)
CO2: 29 mEq/L (ref 19–32)
Calcium: 9.6 mg/dL (ref 8.4–10.5)
Chloride: 103 mEq/L (ref 96–112)
Creatinine, Ser: 0.67 mg/dL (ref 0.40–1.20)
GFR: 104.44 mL/min (ref >60.00)
Glucose, Bld: 77 mg/dL (ref 70–99)
Potassium: 3.5 mEq/L (ref 3.5–5.1)
Sodium: 140 mEq/L (ref 135–145)
Total Bilirubin: 0.6 mg/dL (ref 0.2–1.2)
Total Protein: 8 g/dL (ref 6.0–8.3)

## 2021-07-10 LAB — LIPID PANEL
Cholesterol: 196 mg/dL (ref 0–200)
HDL: 83 mg/dL (ref >39.00)
LDL Cholesterol: 105 mg/dL — ABNORMAL HIGH (ref 0–99)
NonHDL: 113.35
Total CHOL/HDL Ratio: 2
Triglycerides: 40 mg/dL (ref 0.0–149.0)
VLDL: 8 mg/dL (ref 0.0–40.0)

## 2021-08-19 DIAGNOSIS — F430 Acute stress reaction: Secondary | ICD-10-CM | POA: Diagnosis not present

## 2021-08-19 DIAGNOSIS — F320 Major depressive disorder, single episode, mild: Secondary | ICD-10-CM | POA: Diagnosis not present

## 2021-08-30 DIAGNOSIS — F320 Major depressive disorder, single episode, mild: Secondary | ICD-10-CM | POA: Diagnosis not present

## 2021-08-30 DIAGNOSIS — F430 Acute stress reaction: Secondary | ICD-10-CM | POA: Diagnosis not present

## 2021-09-04 DIAGNOSIS — F430 Acute stress reaction: Secondary | ICD-10-CM | POA: Diagnosis not present

## 2021-09-04 DIAGNOSIS — F320 Major depressive disorder, single episode, mild: Secondary | ICD-10-CM | POA: Diagnosis not present

## 2021-09-10 ENCOUNTER — Encounter: Payer: Self-pay | Admitting: Physician Assistant

## 2021-09-10 DIAGNOSIS — F320 Major depressive disorder, single episode, mild: Secondary | ICD-10-CM | POA: Diagnosis not present

## 2021-09-10 DIAGNOSIS — F430 Acute stress reaction: Secondary | ICD-10-CM | POA: Diagnosis not present

## 2021-09-11 ENCOUNTER — Ambulatory Visit: Payer: BC Managed Care – PPO | Admitting: Physician Assistant

## 2021-09-11 ENCOUNTER — Encounter: Payer: Self-pay | Admitting: Physician Assistant

## 2021-09-11 ENCOUNTER — Other Ambulatory Visit (HOSPITAL_COMMUNITY)
Admission: RE | Admit: 2021-09-11 | Discharge: 2021-09-11 | Disposition: A | Discharge status: Home / Self Care | Payer: BC Managed Care – PPO | Source: Ambulatory Visit | Attending: Physician Assistant | Admitting: Physician Assistant

## 2021-09-11 VITALS — BP 136/90 | HR 86 | Temp 97.3°F | Ht 66.0 in | Wt 143.5 lb

## 2021-09-11 DIAGNOSIS — Z1211 Encounter for screening for malignant neoplasm of colon: Secondary | ICD-10-CM

## 2021-09-11 DIAGNOSIS — Z124 Encounter for screening for malignant neoplasm of cervix: Secondary | ICD-10-CM | POA: Diagnosis not present

## 2021-09-11 DIAGNOSIS — N760 Acute vaginitis: Secondary | ICD-10-CM | POA: Diagnosis not present

## 2021-09-11 MED ORDER — FLUCONAZOLE 150 MG PO TABS: ORAL_TABLET | ORAL | 0 refills | Status: DC

## 2021-09-11 NOTE — Patient Instructions (Signed)
It was great to see you! ? ?Take the diflucan tablet ?I will be in touch with your results ? ?You will be contacted about your cologuard order for colon cancer screening ? ?Take care, ? ?Inda Coke PA-C  ?

## 2021-09-11 NOTE — Progress Notes (Signed)
Shirley Nunez is a 46 y.o. female here for a new problem. ? ?I acted as a Education administrator for [employer], PA-C ?Anselmo Pickler, LPN ? ?History of Present Illness:  ? ?Chief Complaint  ?Patient presents with  ? Vaginal Discharge  ? ? ?HPI ? ?Vaginal discharge ?Pt c/o weight vaginal discharge off and on since Sunday. She is having vaginal dryness and irritation. Pt say she has been using Lume wipes for about 2 weeks now that is the only thing different. She did have some leftover metrogel from a prior appointment and used this once, about a week and a half ago. ? ?Colon cancer screening ?She is agreeable to having Cologuard ? ?PAP smear ?Last done in 2021. She would like repeat testing today. Has required colposcopy in the past. ? ? ?Past Medical History:  ?Diagnosis Date  ? Anxiety   ? Depression   ? HSV infection   ? on daily medication  ? Migraine   ? Sickle cell anemia (HCC)   ? Sickle cell trait  ? ?  ?Social History  ? ?Tobacco Use  ? Smoking status: Never  ? Smokeless tobacco: Never  ?Vaping Use  ? Vaping Use: Never used  ?Substance Use Topics  ? Alcohol use: Yes  ?  Alcohol/week: 2.0 standard drinks  ?  Types: 2 Glasses of wine per week  ? Drug use: No  ? ? ?Past Surgical History:  ?Procedure Laterality Date  ? ENDOMETRIAL ABLATION    ? WISDOM TOOTH EXTRACTION    ? ? ?Family History  ?Problem Relation Age of Onset  ? Hypertension Mother   ? Depression Mother   ? Miscarriages / [place] Mother   ? Hypertension Father   ? Hyperlipidemia Father   ? Diabetes Father   ? Arthritis Father   ? Stroke Father   ? Sickle cell anemia Sister   ? Alcohol abuse Maternal Grandmother   ? Arthritis Maternal Grandmother   ? Depression Maternal Grandmother   ? Diabetes Maternal Grandmother   ? Heart attack Maternal Grandmother   ? Hypertension Maternal Grandmother   ? Stroke Maternal Grandmother   ? Heart attack Maternal Grandfather   ? Lung cancer Paternal Grandmother   ?     mets to brain  ? Alcohol abuse Paternal Grandfather    ? Heart attack Paternal Grandfather   ? Hypertension Paternal Grandfather   ? Stroke Paternal Grandfather   ? Colon cancer Neg Hx   ? ? ?Allergies  ?Allergen Reactions  ? Mushroom Extract Complex Rash  ? Penicillins Rash  ? ? ?Current Medications:  ? ?Current Outpatient Medications:  ?  hydrochlorothiazide (HYDRODIURIL) 25 MG tablet, Take 1 tablet (25 mg total) by mouth daily. (Patient taking differently: Take 25 mg by mouth daily as needed.), Disp: 90 tablet, Rfl: 1 ?  Multiple Vitamin (MULTIVITAMIN) tablet, Take 1 tablet by mouth daily., Disp: , Rfl:  ?  OVER THE COUNTER MEDICATION, Take 99 mg by mouth as needed. Potassium supplement on days she takes HCTZ, Disp: , Rfl:  ?  St Johns Wort 300 MG TABS, Take 600 mg by mouth daily in the afternoon., Disp: , Rfl:  ?  valACYclovir (VALTREX) 500 MG tablet, Take 1 tablet (500 mg total) by mouth daily., Disp: 90 tablet, Rfl: 2  ? ?Review of Systems:  ? ?ROS ?Negative unless otherwise specified per HPI. ? ?Vitals:  ? ?Vitals:  ? 09/11/21 0815  ?BP: 138/90  ?Pulse: 92  ?Temp: (!) 97.3 ?F (36.3 ?C)  ?  TempSrc: Temporal  ?SpO2: 97%  ?Weight: 143 lb 8 oz (65.1 kg)  ?Height: '5\' 6"'$  (1.676 m)  ?   ?Body mass index is 23.16 kg/m?. ? ?Physical Exam:  ? ?Physical Exam ?Vitals and nursing note reviewed. Exam conducted with a chaperone present.  ?Constitutional:   ?   General: She is not in acute distress. ?   Appearance: She is well-developed. She is not ill-appearing or toxic-appearing.  ?Cardiovascular:  ?   Rate and Rhythm: Normal rate and regular rhythm.  ?   Pulses: Normal pulses.  ?   Heart sounds: Normal heart sounds, S1 normal and S2 normal.  ?Pulmonary:  ?   Effort: Pulmonary effort is normal.  ?   Breath sounds: Normal breath sounds.  ?Genitourinary: ?   Vagina: Vaginal discharge present.  ?   Cervix: Normal.  ?   Uterus: Normal.   ?   Adnexa: Right adnexa normal and left adnexa normal.  ?Skin: ?   General: Skin is warm and dry.  ?Neurological:  ?   Mental Status: She is  alert.  ?   GCS: GCS eye subscore is 4. GCS verbal subscore is 5. GCS motor subscore is 6.  ?Psychiatric:     ?   Speech: Speech normal.     ?   Behavior: Behavior normal. Behavior is cooperative.  ? ? ?Assessment and Plan:  ? ?Acute vaginitis ?No red flags ?Suspect possible yeast infection vs BV ?Provided rx for diflucan to take empirically for yeast infection ?Further treatment based on results ? ?Pap smear for cervical cancer screening ?Updated today ? ?Special screening for malignant neoplasms, colon ?Agreeable to Cologuard, this was ordered ? ? ?Inda Coke, PA-C ?

## 2021-09-12 ENCOUNTER — Ambulatory Visit: Payer: BC Managed Care – PPO | Admitting: Physician Assistant

## 2021-09-12 LAB — CERVICOVAGINAL ANCILLARY ONLY
Bacterial Vaginitis (gardnerella): NEGATIVE
Candida Glabrata: NEGATIVE
Candida Vaginitis: POSITIVE — AB
Chlamydia: NEGATIVE
Comment: NEGATIVE
Comment: NEGATIVE
Comment: NEGATIVE
Comment: NEGATIVE
Comment: NEGATIVE
Comment: NORMAL
Neisseria Gonorrhea: NEGATIVE
Trichomonas: NEGATIVE

## 2021-09-12 LAB — CYTOLOGY - PAP
Comment: NEGATIVE
Diagnosis: NEGATIVE
High risk HPV: POSITIVE — AB

## 2021-09-18 ENCOUNTER — Encounter: Payer: Self-pay | Admitting: Physician Assistant

## 2021-09-30 DIAGNOSIS — F430 Acute stress reaction: Secondary | ICD-10-CM | POA: Diagnosis not present

## 2021-09-30 DIAGNOSIS — F320 Major depressive disorder, single episode, mild: Secondary | ICD-10-CM | POA: Diagnosis not present

## 2021-10-09 DIAGNOSIS — F320 Major depressive disorder, single episode, mild: Secondary | ICD-10-CM | POA: Diagnosis not present

## 2021-10-09 DIAGNOSIS — F430 Acute stress reaction: Secondary | ICD-10-CM | POA: Diagnosis not present

## 2021-10-21 DIAGNOSIS — F320 Major depressive disorder, single episode, mild: Secondary | ICD-10-CM | POA: Diagnosis not present

## 2021-10-21 DIAGNOSIS — F430 Acute stress reaction: Secondary | ICD-10-CM | POA: Diagnosis not present

## 2021-10-28 DIAGNOSIS — F320 Major depressive disorder, single episode, mild: Secondary | ICD-10-CM | POA: Diagnosis not present

## 2021-10-28 DIAGNOSIS — F430 Acute stress reaction: Secondary | ICD-10-CM | POA: Diagnosis not present

## 2021-11-11 DIAGNOSIS — F430 Acute stress reaction: Secondary | ICD-10-CM | POA: Diagnosis not present

## 2021-11-11 DIAGNOSIS — F320 Major depressive disorder, single episode, mild: Secondary | ICD-10-CM | POA: Diagnosis not present

## 2021-11-14 ENCOUNTER — Emergency Department (HOSPITAL_BASED_OUTPATIENT_CLINIC_OR_DEPARTMENT_OTHER)
Admission: EM | Admit: 2021-11-14 | Discharge: 2021-11-14 | Disposition: A | Discharge status: Home / Self Care | Payer: BC Managed Care – PPO | Attending: Emergency Medicine | Admitting: Emergency Medicine

## 2021-11-14 ENCOUNTER — Other Ambulatory Visit: Payer: Self-pay

## 2021-11-14 ENCOUNTER — Encounter (HOSPITAL_BASED_OUTPATIENT_CLINIC_OR_DEPARTMENT_OTHER): Payer: Self-pay | Admitting: Emergency Medicine

## 2021-11-14 ENCOUNTER — Emergency Department (HOSPITAL_BASED_OUTPATIENT_CLINIC_OR_DEPARTMENT_OTHER): Payer: BC Managed Care – PPO

## 2021-11-14 DIAGNOSIS — R1012 Left upper quadrant pain: Secondary | ICD-10-CM | POA: Insufficient documentation

## 2021-11-14 DIAGNOSIS — E876 Hypokalemia: Secondary | ICD-10-CM | POA: Diagnosis not present

## 2021-11-14 DIAGNOSIS — R1013 Epigastric pain: Secondary | ICD-10-CM | POA: Diagnosis not present

## 2021-11-14 DIAGNOSIS — R109 Unspecified abdominal pain: Secondary | ICD-10-CM | POA: Diagnosis not present

## 2021-11-14 LAB — CBC WITH DIFFERENTIAL/PLATELET
Abs Immature Granulocytes: 0.02 10*3/uL (ref 0.00–0.07)
Basophils Absolute: 0.1 10*3/uL (ref 0.0–0.1)
Basophils Relative: 1 %
Eosinophils Absolute: 0.7 10*3/uL — ABNORMAL HIGH (ref 0.0–0.5)
Eosinophils Relative: 9 %
HCT: 35.7 % — ABNORMAL LOW (ref 36.0–46.0)
Hemoglobin: 12.2 g/dL (ref 12.0–15.0)
Immature Granulocytes: 0 %
Lymphocytes Relative: 21 %
Lymphs Abs: 1.8 10*3/uL (ref 0.7–4.0)
MCH: 32.4 pg (ref 26.0–34.0)
MCHC: 34.2 g/dL (ref 30.0–36.0)
MCV: 94.9 fL (ref 80.0–100.0)
Monocytes Absolute: 0.6 10*3/uL (ref 0.1–1.0)
Monocytes Relative: 7 %
Neutro Abs: 5.2 10*3/uL (ref 1.7–7.7)
Neutrophils Relative %: 62 %
Platelets: 303 10*3/uL (ref 150–400)
RBC: 3.76 MIL/uL — ABNORMAL LOW (ref 3.87–5.11)
RDW: 11 % — ABNORMAL LOW (ref 11.5–15.5)
WBC: 8.3 10*3/uL (ref 4.0–10.5)
nRBC: 0 % (ref 0.0–0.2)

## 2021-11-14 LAB — LIPASE, BLOOD: Lipase: 28 U/L (ref 11–51)

## 2021-11-14 LAB — COMPREHENSIVE METABOLIC PANEL
ALT: 11 U/L (ref 0–44)
AST: 16 U/L (ref 15–41)
Albumin: 4.1 g/dL (ref 3.5–5.0)
Alkaline Phosphatase: 69 U/L (ref 38–126)
Anion gap: 8 (ref 5–15)
BUN: 10 mg/dL (ref 6–20)
CO2: 31 mmol/L (ref 22–32)
Calcium: 9.3 mg/dL (ref 8.9–10.3)
Chloride: 100 mmol/L (ref 98–111)
Creatinine, Ser: 0.73 mg/dL (ref 0.44–1.00)
GFR, Estimated: >60 mL/min (ref >60)
Glucose, Bld: 109 mg/dL — ABNORMAL HIGH (ref 70–99)
Potassium: 2.6 mmol/L — CL (ref 3.5–5.1)
Sodium: 139 mmol/L (ref 135–145)
Total Bilirubin: 0.5 mg/dL (ref 0.3–1.2)
Total Protein: 8.1 g/dL (ref 6.5–8.1)

## 2021-11-14 LAB — URINALYSIS, ROUTINE W REFLEX MICROSCOPIC
Bilirubin Urine: NEGATIVE
Glucose, UA: NEGATIVE mg/dL
Hgb urine dipstick: NEGATIVE
Ketones, ur: NEGATIVE mg/dL
Leukocytes,Ua: NEGATIVE
Nitrite: NEGATIVE
Protein, ur: NEGATIVE mg/dL
Specific Gravity, Urine: 1.015 (ref 1.005–1.030)
pH: 7 (ref 5.0–8.0)

## 2021-11-14 LAB — MAGNESIUM: Magnesium: 2.2 mg/dL (ref 1.7–2.4)

## 2021-11-14 LAB — PREGNANCY, URINE: Preg Test, Ur: NEGATIVE

## 2021-11-14 MED ORDER — FAMOTIDINE 20 MG PO TABS: 20.0000 mg | ORAL_TABLET | Freq: Two times a day (BID) | ORAL | 0 refills | Status: DC

## 2021-11-14 MED ORDER — IOHEXOL 300 MG/ML  SOLN
100.0000 mL | Freq: Once | INTRAMUSCULAR | Status: AC | PRN
Start: 2021-11-14 — End: 2021-11-14
  Administered 2021-11-14: 100 mL via INTRAVENOUS

## 2021-11-14 MED ORDER — PANTOPRAZOLE SODIUM 20 MG PO TBEC: 20.0000 mg | DELAYED_RELEASE_TABLET | Freq: Every day | ORAL | 0 refills | Status: DC

## 2021-11-14 MED ORDER — SUCRALFATE 1 G PO TABS: 1.0000 g | ORAL_TABLET | Freq: Three times a day (TID) | ORAL | 1 refills | Status: DC

## 2021-11-14 MED ORDER — ALUM & MAG HYDROXIDE-SIMETH 200-200-20 MG/5ML PO SUSP
30.0000 mL | Freq: Once | ORAL | Status: AC
  Administered 2021-11-14: 30 mL via ORAL
  Filled 2021-11-14: qty 30

## 2021-11-14 MED ORDER — POTASSIUM CHLORIDE CRYS ER 20 MEQ PO TBCR
60.0000 meq | EXTENDED_RELEASE_TABLET | Freq: Once | ORAL | Status: AC
  Administered 2021-11-14: 60 meq via ORAL
  Filled 2021-11-14: qty 3

## 2021-11-14 MED ORDER — POTASSIUM CHLORIDE 10 MEQ/100ML IV SOLN
10.0000 meq | INTRAVENOUS | Status: AC
  Administered 2021-11-14 (×2): 10 meq via INTRAVENOUS
  Filled 2021-11-14 (×2): qty 100

## 2021-11-14 NOTE — Discharge Instructions (Addendum)
Please read and follow all provided instructions.  Your diagnoses today include:  1. Epigastric pain     Tests performed today include: Blood cell counts and platelets Kidney and liver function tests: low potassium Pancreas function test (called lipase) Urine test to look for infection A blood or urine test for pregnancy (women only) CT scan of the abdomen and pelvis: No concerning problems seen on the CT scan Vital signs. See below for your results today.   Medications prescribed:  Pantoprazole - stomach acid reducer  Pepcid (famotidine) - antihistamine  You can find this medication over-the-counter.   DO NOT exceed:  '20mg'$  Pepcid every 12 hours  Carafate - for stomach upset and to protect your stomach  Take any prescribed medications only as directed.  Home care instructions:  Follow any educational materials contained in this packet.  Follow-up instructions: Please follow-up with your primary care provider in the next 3 days for further evaluation of your symptoms.    Return instructions:  SEEK IMMEDIATE MEDICAL ATTENTION IF: The pain does not go away or becomes severe  A temperature above 101F develops  Repeated vomiting occurs (multiple episodes)  The pain becomes localized to portions of the abdomen. The right side could possibly be appendicitis. In an adult, the left lower portion of the abdomen could be colitis or diverticulitis.  Blood is being passed in stools or vomit (bright red or black tarry stools)  You develop chest pain, difficulty breathing, dizziness or fainting, or become confused, poorly responsive, or inconsolable (young children) If you have any other emergent concerns regarding your health  Additional Information: Abdominal (belly) pain can be caused by many things. Your caregiver performed an examination and possibly ordered blood/urine tests and imaging (CT scan, x-rays, ultrasound). Many cases can be observed and treated at home after initial  evaluation in the emergency department. Even though you are being discharged home, abdominal pain can be unpredictable. Therefore, you need a repeated exam if your pain does not resolve, returns, or worsens. Most patients with abdominal pain don't have to be admitted to the hospital or have surgery, but serious problems like appendicitis and gallbladder attacks can start out as nonspecific pain. Many abdominal conditions cannot be diagnosed in one visit, so follow-up evaluations are very important.  Your vital signs today were: BP (!) 141/100   Pulse 76   Temp 99 F (37.2 C) (Oral)   Resp 18   Wt 61.2 kg   SpO2 100%   BMI 21.79 kg/m  If your blood pressure (bp) was elevated above 135/85 this visit, please have this repeated by your doctor within one month. --------------

## 2021-11-14 NOTE — ED Triage Notes (Signed)
Epigastric abdominal pain x 3 days. Pain radiates to mid back. Pain worsens after eating. Pt took multiple doses of pepto. LBM today. Reports dark colored stools.

## 2021-11-14 NOTE — ED Notes (Signed)
Patient transported to CT 

## 2021-11-14 NOTE — ED Notes (Signed)
Pt aware of need for urine specimen, unable to provide at this time, IV fluids/potassium infusing

## 2021-11-14 NOTE — ED Provider Notes (Signed)
[facility] EMERGENCY DEPARTMENT Provider Note   CSN: [id number] Arrival date & time: 11/14/21  1559     History  Chief Complaint  Patient presents with   Abdominal Pain    Shirley Nunez is a 46 y.o. female.  Patient with no past surgical history on her abdomen presents to the emergency department today for epigastric pain ongoing for about 3 days.  Patient describes upper abdominal pain, waxing and waning, with nausea and early satiety.  She denies persistent vomiting and denies anorexia.  Patient denies heavy alcohol use.  She does take ibuprofen frequently for headaches, she estimates every other day.  No history of GERD or stomach ulcers.  She reports an intense burning in her upper abdomen.  She tried to eat today and this made her symptoms worse and she did have an episode of vomiting.  She has not had pain like this in the past.  She is on a "fluid pill" for mild extremity swelling.  She has been taking a potassium supplement.  No chest pain or shortness of breath.  She has been taking gas medication and Pepto-Bismol.  She did have a bowel movement today which was black.       Home Medications Prior to Admission medications   Medication Sig Start Date End Date Taking? Authorizing Provider  fluconazole (DIFLUCAN) 150 MG tablet Take 1 tablet now and then take another in one week if needed. 09/11/21   Inda Coke, PA  hydrochlorothiazide (HYDRODIURIL) 25 MG tablet Take 1 tablet (25 mg total) by mouth daily. Patient taking differently: Take 25 mg by mouth daily as needed. 01/21/21   Inda Coke, PA  Multiple Vitamin (MULTIVITAMIN) tablet Take 1 tablet by mouth daily.    [provider]  OVER THE COUNTER MEDICATION Take 99 mg by mouth as needed. Potassium supplement on days she takes HCTZ    [provider]  [facility] Wort 300 MG TABS Take 600 mg by mouth daily in the afternoon.    [provider]  valACYclovir (VALTREX) 500 MG tablet Take 1  tablet (500 mg total) by mouth daily. 01/21/21   Inda Coke, PA      Allergies    Mushroom extract complex and Penicillins    Review of Systems   Review of Systems  Physical Exam Updated Vital Signs BP (!) 176/107   Pulse 80   Temp 99 F (37.2 C) (Oral)   Resp 12   Wt 61.2 kg   SpO2 98%   BMI 21.79 kg/m  Physical Exam Vitals and nursing note reviewed.  Constitutional:      General: She is not in acute distress.    Appearance: She is well-developed.  HENT:     Head: Normocephalic and atraumatic.     Right Ear: External ear normal.     Left Ear: External ear normal.     Nose: Nose normal.  Eyes:     Conjunctiva/sclera: Conjunctivae normal.  Cardiovascular:     Rate and Rhythm: Normal rate and regular rhythm.     Heart sounds: No murmur heard. Pulmonary:     Effort: No respiratory distress.     Breath sounds: No wheezing, rhonchi or rales.  Abdominal:     Palpations: Abdomen is soft.     Tenderness: There is abdominal tenderness (moderate) in the epigastric area and left upper quadrant. There is no guarding or rebound. Negative signs include Murphy's sign and McBurney's sign.     Comments: Patient  is uncomfortable with palpation in the upper abdomen as well as with movement.  Musculoskeletal:     Cervical back: Normal range of motion and neck supple.     Right lower leg: No edema.     Left lower leg: No edema.  Skin:    General: Skin is warm and dry.     Findings: No rash.  Neurological:     General: No focal deficit present.     Mental Status: She is alert. Mental status is at baseline.     Motor: No weakness.  Psychiatric:        Mood and Affect: Mood normal.     ED Results / Procedures / Treatments   Labs (all labs ordered are listed, but only abnormal results are displayed) Labs Reviewed  COMPREHENSIVE METABOLIC PANEL - Abnormal; Notable for the following components:      Result Value   Potassium 2.6 (*)    Glucose, Bld 109 (*)    All other  components within normal limits  URINALYSIS, ROUTINE W REFLEX MICROSCOPIC - Abnormal; Notable for the following components:   APPearance HAZY (*)    All other components within normal limits  CBC WITH DIFFERENTIAL/PLATELET - Abnormal; Notable for the following components:   RBC 3.76 (*)    HCT 35.7 (*)    RDW 11.0 (*)    Eosinophils Absolute 0.7 (*)    All other components within normal limits  LIPASE, BLOOD  PREGNANCY, URINE  MAGNESIUM    EKG EKG Interpretation  Date/Time:  Thursday November 14 2021 17:23:50 EDT Ventricular Rate:  74 PR Interval:  184 QRS Duration: 111 QT Interval:  415 QTC Calculation: 461 R Axis:   64 Text Interpretation: Sinus rhythm No significant change since prior 7/11 Confirmed by Aletta Edouard [phone]) on 11/14/2021 5:35:36 PM  Radiology No results found.  Procedures Procedures    Medications Ordered in ED Medications  potassium chloride SA (KLOR-CON M) CR tablet 60 mEq (60 mEq Oral Given 11/14/21 1805)  potassium chloride 10 mEq in 100 mL IVPB (10 mEq Intravenous New Bag/Given 11/14/21 1937)  iohexol (OMNIPAQUE) 300 MG/ML solution 100 mL (100 mLs Intravenous Contrast Given 11/14/21 1848)  alum & mag hydroxide-simeth (MAALOX/MYLANTA) 200-200-20 MG/5ML suspension 30 mL (30 mLs Oral Given 11/14/21 2100)    ED Course/ Medical Decision Making/ A&P    Patient seen and examined. History obtained directly from patient. Work-up including labs, imaging, EKG ordered in triage, if performed, were reviewed.    Labs/EKG: Independently reviewed and interpreted.  This included: CMP with potassium 2.6, normal kidney function; CBC with normal white blood cell count, normal hemoglobin.  Added magnesium.  EKG personally reviewed and interpreted without any concerning findings of hypokalemia.  Imaging: Ordered CT abdomen pelvis.  Medications/Fluids: Oral and IV potassium repletion  Most recent vital signs reviewed and are as follows: BP (!) 176/107   Pulse 80   Temp  99 F (37.2 C) (Oral)   Resp 12   Wt 61.2 kg   SpO2 98%   BMI 21.79 kg/m   Initial impression: Epigastric pain, likely due to gastritis or peptic ulcer disease due to ongoing NSAID use, however will need to rule out bowel perforation, gallbladder disease, pancreatitis secondary to tenderness noted on exam.  Also hypokalemia likely secondary to poor oral intake and recent diuretic use, will replete.  EKG reassuring.  9:17 PM Reassessment performed. Patient appears comfortable.  Repletion with IV and oral potassium is completed at this time.  Imaging  personally visualized and interpreted including: CT imaging of the abdomen pelvis, agree no acute findings.  Reviewed pertinent lab work and imaging with patient at bedside. Questions answered.   Most current vital signs reviewed and are as follows: BP (!) 141/100   Pulse 76   Temp 99 F (37.2 C) (Oral)   Resp 18   Wt 61.2 kg   SpO2 100%   BMI 21.79 kg/m   Plan: Discharge to home.   Prescriptions written for: Protonix, Pepcid, Carafate  Other home care instructions discussed: Discontinue ibuprofen and other NSAIDs, bland diet, continuing oral potassium repletion at home.  ED return instructions discussed: The patient was urged to return to the Emergency Department immediately with worsening of current symptoms, worsening abdominal pain, persistent vomiting, blood noted in stools, fever, or any other concerns. The patient verbalized understanding.   Follow-up instructions discussed: Patient encouraged to follow-up with their PCP in 3 days.                            Medical Decision Making Amount and/or Complexity of Data Reviewed Labs: ordered. Radiology: ordered.  Risk OTC drugs. Prescription drug management.   For this patient's complaint of abdominal pain, the following conditions were considered on the differential diagnosis: gastritis/PUD, enteritis/duodenitis, appendicitis, cholelithiasis/cholecystitis, cholangitis,  pancreatitis, ruptured viscus, colitis, diverticulitis, small/large bowel obstruction, proctitis, cystitis, pyelonephritis, ureteral colic, aortic dissection, aortic aneurysm. In women, ectopic pregnancy, pelvic inflammatory disease, ovarian cysts, and tubo-ovarian abscess were also considered. Atypical chest etiologies were also considered including ACS, PE, and pneumonia.   The patient's vital signs, pertinent lab work and imaging were reviewed and interpreted as discussed in the ED course. Hospitalization was considered for further testing, treatments, or serial exams/observation. However as patient is well-appearing, has a stable exam, and reassuring studies today, I do not feel that they warrant admission at this time. This plan was discussed with the patient who verbalizes agreement and comfort with this plan and seems reliable and able to return to the Emergency Department with worsening or changing symptoms.          Final Clinical Impression(s) / ED Diagnoses Final diagnoses:  Epigastric pain    Rx / DC Orders ED Discharge Orders          Ordered    pantoprazole (PROTONIX) 20 MG tablet  Daily        11/14/21 2114    famotidine (PEPCID) 20 MG tablet  2 times daily        11/14/21 2114    sucralfate (CARAFATE) 1 g tablet  3 times daily with meals & bedtime        11/14/21 2114              Carlisle Cater, PA-C 11/14/21 2119    Hayden Rasmussen, MD 11/15/21 [phone]

## 2021-11-18 DIAGNOSIS — F320 Major depressive disorder, single episode, mild: Secondary | ICD-10-CM | POA: Diagnosis not present

## 2021-11-18 DIAGNOSIS — F430 Acute stress reaction: Secondary | ICD-10-CM | POA: Diagnosis not present

## 2021-11-19 ENCOUNTER — Ambulatory Visit: Payer: BC Managed Care – PPO | Admitting: Physician Assistant

## 2021-11-19 ENCOUNTER — Encounter: Payer: Self-pay | Admitting: Physician Assistant

## 2021-11-19 ENCOUNTER — Encounter: Payer: Self-pay | Admitting: Gastroenterology

## 2021-11-19 VITALS — BP 108/70 | HR 99 | Temp 97.8°F | Ht 66.0 in | Wt 138.0 lb

## 2021-11-19 DIAGNOSIS — E876 Hypokalemia: Secondary | ICD-10-CM

## 2021-11-19 DIAGNOSIS — R1010 Upper abdominal pain, unspecified: Secondary | ICD-10-CM

## 2021-11-19 LAB — COMPREHENSIVE METABOLIC PANEL
ALT: 13 U/L (ref 0–35)
AST: 19 U/L (ref 0–37)
Albumin: 4.2 g/dL (ref 3.5–5.2)
Alkaline Phosphatase: 61 U/L (ref 39–117)
BUN: 8 mg/dL (ref 6–23)
CO2: 30 mEq/L (ref 19–32)
Calcium: 9.1 mg/dL (ref 8.4–10.5)
Chloride: 102 mEq/L (ref 96–112)
Creatinine, Ser: 0.73 mg/dL (ref 0.40–1.20)
GFR: 98.02 mL/min (ref >60.00)
Glucose, Bld: 75 mg/dL (ref 70–99)
Potassium: 3.3 mEq/L — ABNORMAL LOW (ref 3.5–5.1)
Sodium: 140 mEq/L (ref 135–145)
Total Bilirubin: 0.4 mg/dL (ref 0.2–1.2)
Total Protein: 7.3 g/dL (ref 6.0–8.3)

## 2021-11-19 LAB — H. PYLORI ANTIBODY, IGG: H Pylori IgG: POSITIVE — AB

## 2021-11-19 NOTE — Progress Notes (Signed)
Shirley Nunez is a 46 y.o. female here for a follow up on Abdominal Pain.   History of Present Illness:   Chief Complaint  Patient presents with   f/u from ED    Pt was seen in the ED on 7/6 need to re-check Potassium.    HPI  ED follow-up; Hypokalemia / Epigastric pain  Patient presented to the ED with epigastric pain on 11/14/2021. She went to ED with 3 days of abdominal pain which comes and goes. Per pt, she had sudden onset of pain when at the pool with her kids on the 4th of July.She has tried drinking lots of fluids with no help. Had urge for BM but had no bowel movement. She does admit eating [employer] on Friday afternoon and then went to work that day -- felt "sluggish" all weekend after this. States she has to take sleep aids since she was unable to sleep due to pain. Had not had BM during this time. Usually have BM once daily.   She then went to ED for further evaluation. Had work-up in the ED including labs and EKG. CMP with potassium was 2.6. EKG without any concern findings for hypokalemia. In the ED, they had concern for gastritis or petic ulcer. She was given Oral and IV potasium repletion. CT of abdomen/pelvis with no acute findings.Per pt, her blood pressure dropped in the ED due to the pain. She was prescribed Protonix 20 mg daily, Pepcid 20 mg twice daily and Carafate TID daily. She was then discharged home to follow up with PCP.   Today, she states has taken Protonix, Pepcid and Carafate for 2-3 days with no improvement.  States these medications has made her vomit and feel sick. Her symptoms were improved after stopping these medications. She is currently eating bland diet and has been trying to follow this diet without complication. Has been drinking ginger tea and water which seems to be helping. However, she still has been experiencing some pain. States she was taking Pepto Bismol and gas medication when her symptoms started. Has had some BM this morning which was black.  Denies any bleeding. Has had some diarrhea as well.   She is currently taking OTC [place] potassium 99 mg every other day.  She would like to recheck her potassium levels today. She takes her HCTZ 25 mg roughly every other day.  Past Medical History:  Diagnosis Date   Anxiety    Depression    HSV infection    on daily medication   Migraine    Sickle cell anemia (HCC)    Sickle cell trait     Social History   Tobacco Use   Smoking status: Never   Smokeless tobacco: Never  Vaping Use   Vaping Use: Never used  Substance Use Topics   Alcohol use: Yes    Alcohol/week: 2.0 standard drinks of alcohol    Types: 2 Glasses of wine per week    Comment: 2 x / month   Drug use: No    Past Surgical History:  Procedure Laterality Date   ENDOMETRIAL ABLATION     WISDOM TOOTH EXTRACTION      Family History  Problem Relation Age of Onset   Hypertension Mother    Depression Mother    Miscarriages / [place] Mother    Hypertension Father    Hyperlipidemia Father    Diabetes Father    Arthritis Father    Stroke Father    Sickle cell anemia  Sister    Alcohol abuse Maternal Grandmother    Arthritis Maternal Grandmother    Depression Maternal Grandmother    Diabetes Maternal Grandmother    Heart attack Maternal Grandmother    Hypertension Maternal Grandmother    Stroke Maternal Grandmother    Heart attack Maternal Grandfather    Lung cancer Paternal Grandmother        mets to brain   Alcohol abuse Paternal Grandfather    Heart attack Paternal Grandfather    Hypertension Paternal Grandfather    Stroke Paternal Grandfather    Colon cancer Neg Hx     Allergies  Allergen Reactions   Mushroom Extract Complex Rash   Penicillins Rash    Current Medications:   Current Outpatient Medications:    famotidine (PEPCID AC) 10 MG tablet, Take 10 mg by mouth 2 (two) times daily. OTC, Disp: , Rfl:    hydrochlorothiazide (HYDRODIURIL) 25 MG tablet, Take 1 tablet (25 mg total)  by mouth daily. (Patient taking differently: Take 25 mg by mouth daily as needed.), Disp: 90 tablet, Rfl: 1   Multiple Vitamin (MULTIVITAMIN) tablet, Take 1 tablet by mouth daily., Disp: , Rfl:    OVER THE COUNTER MEDICATION, Take 99 mg by mouth as needed. Potassium supplement on days she takes HCTZ, Disp: , Rfl:    [facility] Wort 300 MG TABS, Take 600 mg by mouth daily in the afternoon., Disp: , Rfl:    valACYclovir (VALTREX) 500 MG tablet, Take 1 tablet (500 mg total) by mouth daily., Disp: 90 tablet, Rfl: 2   famotidine (PEPCID) 20 MG tablet, Take 1 tablet (20 mg total) by mouth 2 (two) times daily. (Patient not taking: Reported on 11/19/2021), Disp: 30 tablet, Rfl: 0   pantoprazole (PROTONIX) 20 MG tablet, Take 1 tablet (20 mg total) by mouth daily. (Patient not taking: Reported on 11/19/2021), Disp: 30 tablet, Rfl: 0   sucralfate (CARAFATE) 1 g tablet, Take 1 tablet (1 g total) by mouth 4 (four) times daily -  with meals and at bedtime. (Patient not taking: Reported on 11/19/2021), Disp: 60 tablet, Rfl: 1   Review of Systems:   ROS Negative unless otherwise specified per HPI.   Vitals:   Vitals:   11/19/21 0918  BP: 108/70  Pulse: 99  Temp: 97.8 F (36.6 C)  TempSrc: Temporal  SpO2: 97%  Weight: 138 lb (62.6 kg)  Height: '5\' 6"'$  (1.676 m)     Body mass index is 22.27 kg/m.  Physical Exam:   Physical Exam Vitals and nursing note reviewed.  Constitutional:      General: She is not in acute distress.    Appearance: She is well-developed. She is not ill-appearing or toxic-appearing.  Cardiovascular:     Rate and Rhythm: Normal rate and regular rhythm.     Pulses: Normal pulses.     Heart sounds: Normal heart sounds, S1 normal and S2 normal.  Pulmonary:     Effort: Pulmonary effort is normal.     Breath sounds: Normal breath sounds.  Abdominal:     General: Abdomen is flat. Bowel sounds are normal.     Palpations: Abdomen is soft.     Tenderness: There is abdominal tenderness  in the epigastric area. There is no right CVA tenderness or left CVA tenderness.  Skin:    General: Skin is warm and dry.  Neurological:     Mental Status: She is alert.     GCS: GCS eye subscore is 4. GCS verbal subscore  is 5. GCS motor subscore is 6.  Psychiatric:        Speech: Speech normal.        Behavior: Behavior normal. Behavior is cooperative.     Assessment and Plan:   Hypokalemia We will recheck potassium today and advise further on supplementation once these results return  Pain of upper abdomen Slowly improving No acute abdomen today Recommend resuming PPI but she does not want to due to possible side effect she experienced Recommend restarting carafate if able to tolerate Continue bland diet Will check for H Pylori, she only took PPI x 2 days Encouraged patient to work on better bowel regimen as I feel as though her symptoms are likely exacerbated by constipation -- handout provided. She is uncomfortable taking miralax or colace given diarrhea this AM. Referral for GI placed per patient request Encouraged her to complete her cologuard  I,Savera Zaman,acting as a scribe for [employer], PA.,have documented all relevant documentation on the behalf of Inda Coke, PA,as directed by  Inda Coke, PA while in the presence of Inda Coke, [place].   I, Inda Coke, [place], have reviewed all documentation for this visit. The documentation on 11/19/21 for the exam, diagnosis, procedures, and orders are all accurate and complete.   Inda Coke, PA-C

## 2021-11-19 NOTE — Patient Instructions (Addendum)
It was great to see you!  Trial the carafate 1 gram before meals -- this helps to coat your stomach and heal any inflammation that is going on in your stomach. Only do this for 5-7 days.  Continue bland diet  I will go ahead and place referral to the gastroenterologist  See handout about constipation -- natural remedies and medications if needed  I will be in touch with your potassium recommendations  Take care,  Inda Coke PA-C

## 2021-11-20 ENCOUNTER — Other Ambulatory Visit: Payer: Self-pay | Admitting: *Deleted

## 2021-11-20 ENCOUNTER — Other Ambulatory Visit: Payer: Self-pay | Admitting: Physician Assistant

## 2021-11-20 MED ORDER — METRONIDAZOLE 500 MG PO TABS: 500.0000 mg | ORAL_TABLET | Freq: Two times a day (BID) | ORAL | 0 refills | Status: AC

## 2021-11-20 MED ORDER — PANTOPRAZOLE SODIUM 20 MG PO TBEC: 20.0000 mg | DELAYED_RELEASE_TABLET | Freq: Two times a day (BID) | ORAL | 0 refills | Status: DC

## 2021-11-20 MED ORDER — FLUCONAZOLE 150 MG PO TABS: ORAL_TABLET | ORAL | 0 refills | Status: DC

## 2021-11-20 MED ORDER — CLARITHROMYCIN 500 MG PO TABS: 500.0000 mg | ORAL_TABLET | Freq: Two times a day (BID) | ORAL | 0 refills | Status: AC

## 2021-11-21 IMAGING — MR MR HIP*L* W/CM
4 of 6 series · 16 of 40 positions shown · IV contrast (agent unspecified)
Comparison: Hip radiograph 09/27/2020

CLINICAL DATA: MR arthrogram left hip eval labral tear

EXAM:
MRI OF THE LEFT HIP WITH CONTRAST (MR Arthrogram)
TECHNIQUE: Multiplanar, multisequence MR imaging of the hip was performed
immediately following contrast injection into the hip joint under
fluoroscopic guidance. No intravenous contrast was administered.

[Series 4: T1 · coronal · 4.0mm · 0.59mm/px · 6 of 24 slices shown]
[im 1/24]
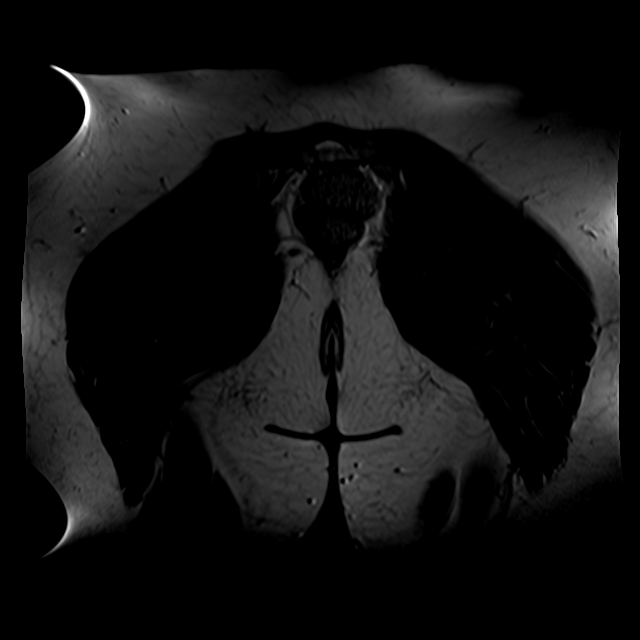
[im 5/24]
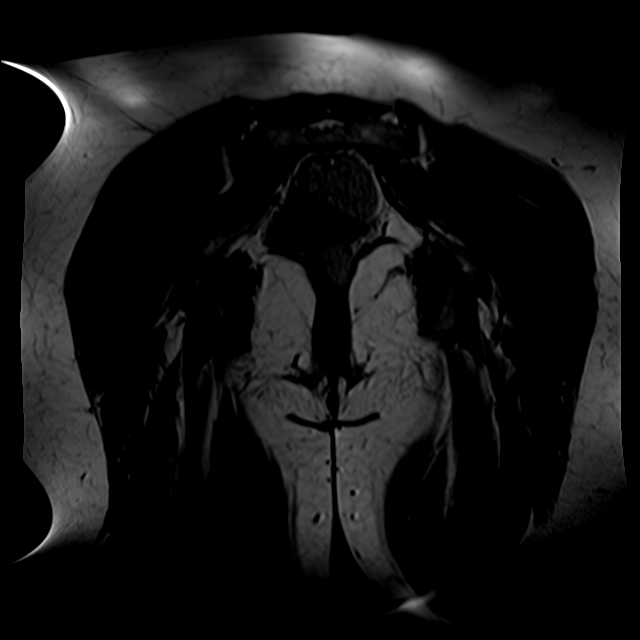
[im 10/24]
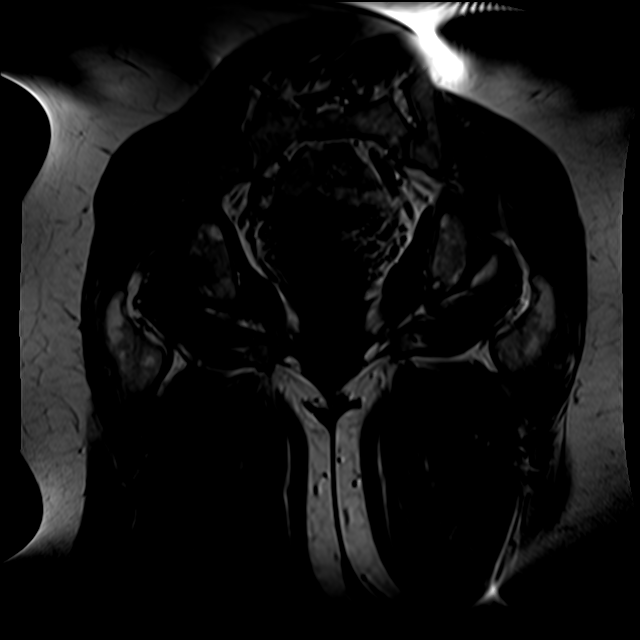
[im 14/24]
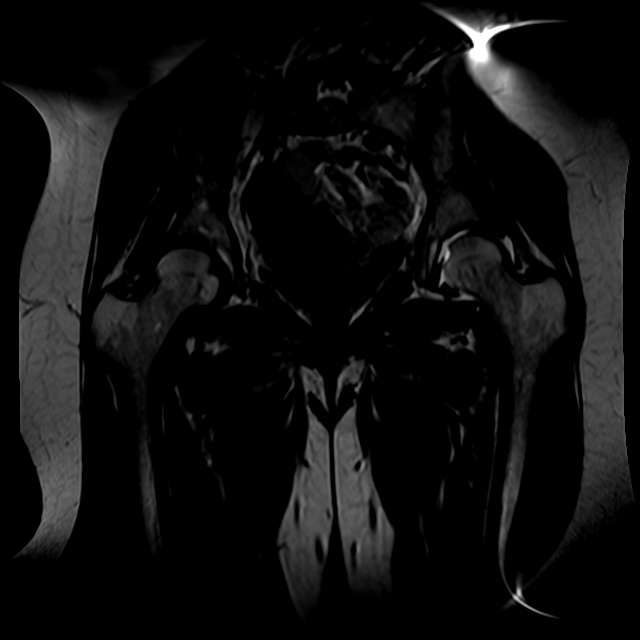
[im 19/24]
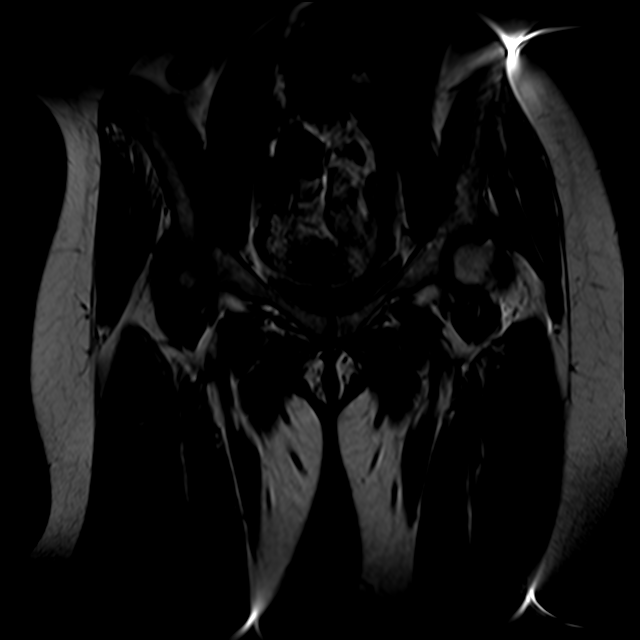
[im 24/24]
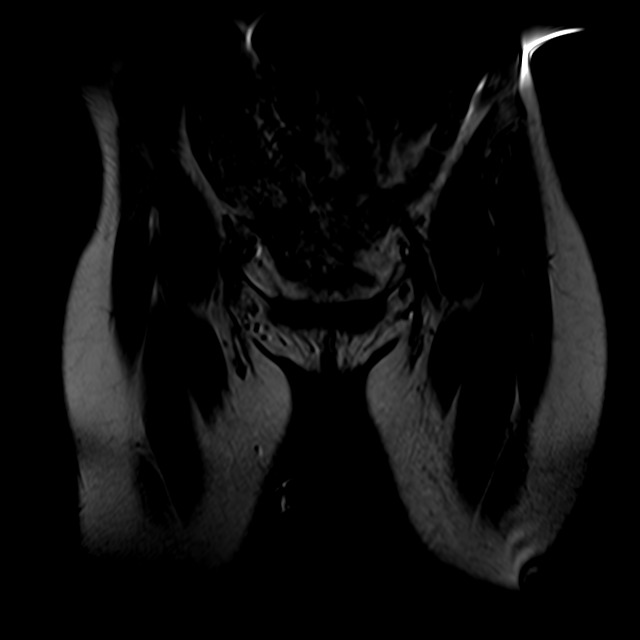

[Series 5: T2 fat-sat · coronal · 4.0mm · 0.53mm/px · 4 of 24 slices shown (1 of 2)]
[im 1/24]
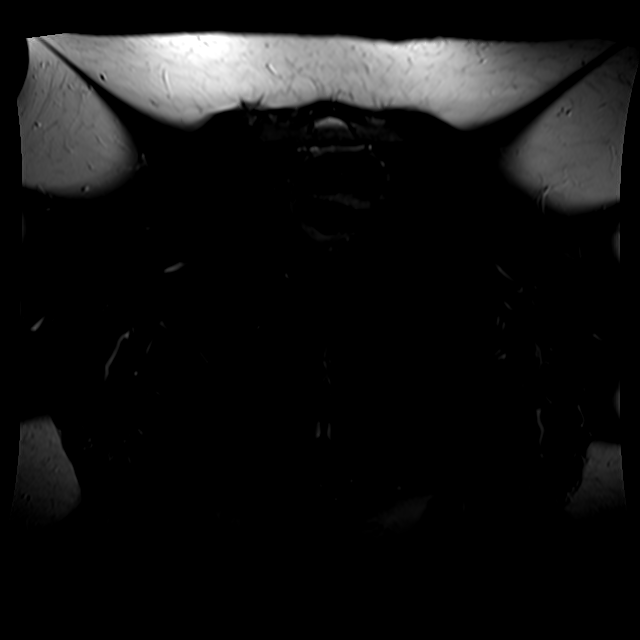
[im 5/24]
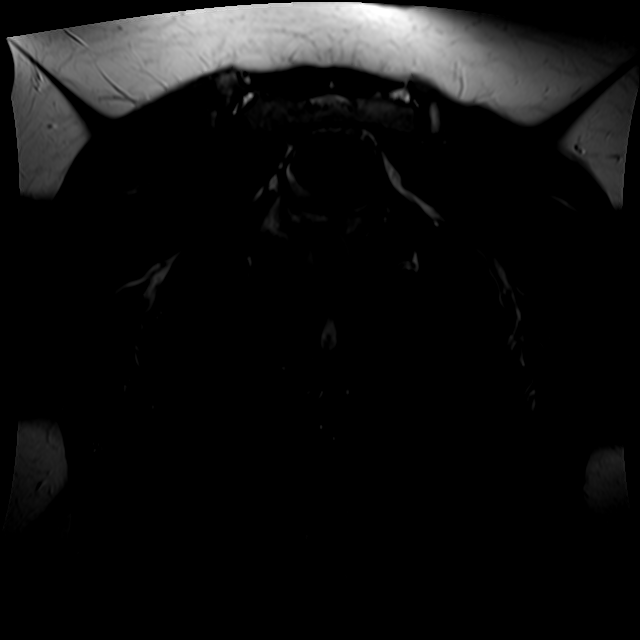
[im 14/24]
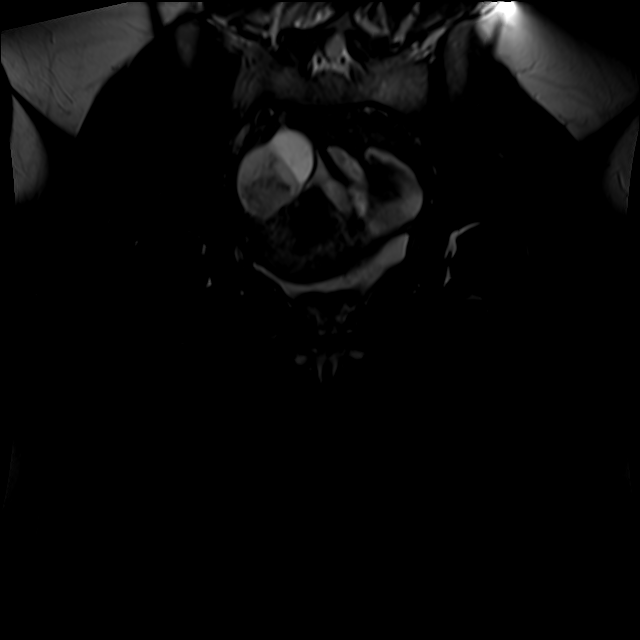
[im 24/24]
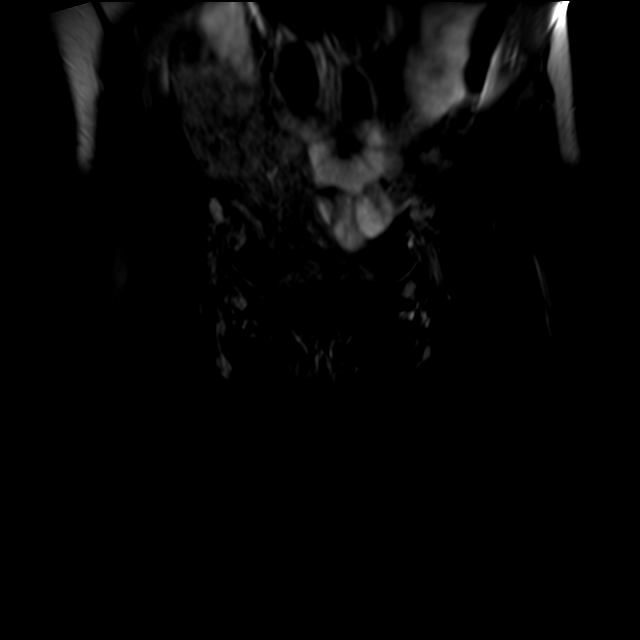

[Series 6: T2 fat-sat · axial · 4.0mm · 0.70mm/px · z∈[-155,-46]mm · 3 of 32 slices shown (2 of 2)]
[im 5/32]
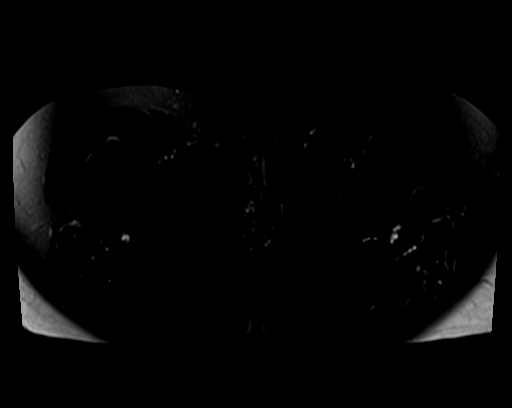
[im 18/32]
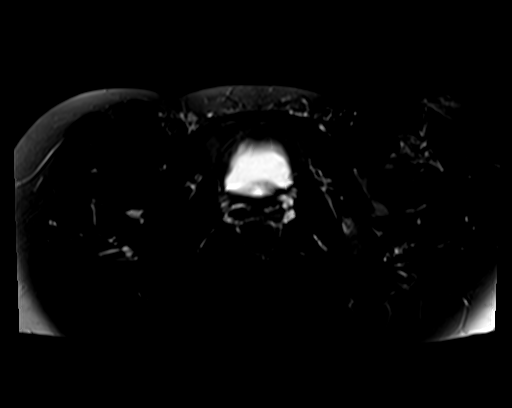
[im 27/32]
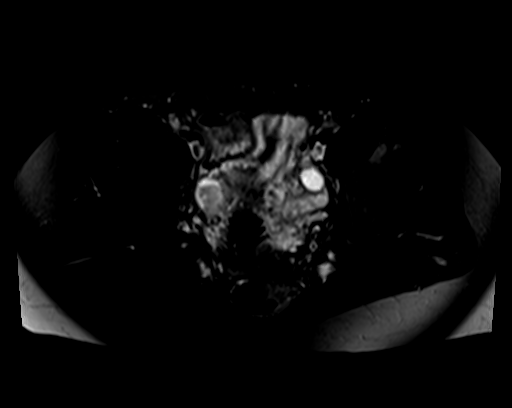

[Series 8: T1 fat-sat · sagittal · 4.0mm · 0.74mm/px · 3 of 29 slices shown]
[im 5/29]
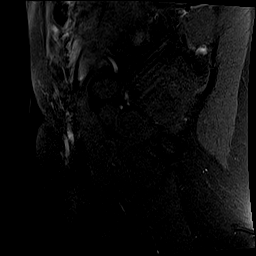
[im 15/29]
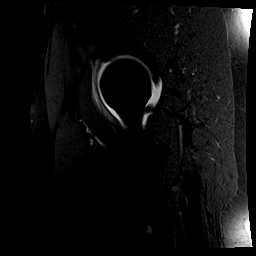
[im 24/29]
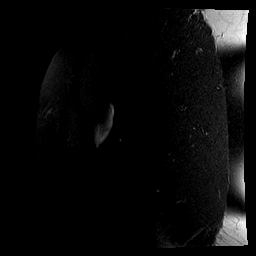

[16 of 40 positions shown; findings below may reference images not displayed]

FINDINGS: Bones: There is no evidence of acute fracture, dislocation or
avascular necrosis. There is a sclerotic of bone lesion in the right
superior acetabulum, consistent with bone island. There is an
additional T2 hyperintense lesion with possibly some internal fat in
the left ischium measuring 1.1 cm (axial T2 fat-sat image 15). The
visualized sacroiliac joints and symphysis pubis appear normal.

Articular cartilage and labrum

Articular cartilage:  Mild chondrosis.

Labrum: Nondisplaced anterior superior labral tearing.

Joint or bursal effusion

Joint effusion: Adequate joint distension from arthrogram technique.

Bursae: No focal periarticular fluid collection.

Muscles and tendons

Muscles and tendons: There is fluid signal at the and proximal
insertion of the rectus femoris tendon and underlying bony edema.
The visualized gluteus, hamstring and iliopsoas tendons appear
normal. No muscle edema or atrophy.

Other findings

Miscellaneous: There is a septated right adnexal mixed cystic and
solid mass which demonstrates heterogeneous T2 signal/low T1 signal
anteriorly and T2 hyperintense/T1 isointense signal posteriorly with
a possible fluid fluid level, measuring approximately 5.3 x 3.6 x
4.7 cm (axial T2 image 2, coronal T1 image 13).
IMPRESSION: Nondisplaced anterior superior labral tearing of the left hip. Mild
left hip chondrosis.

Partial tearing at the proximal insertion of the rectus femoris and
associated reactive underlying bony edema.

Incidental right adnexal mixed cystic and solid mass measuring 5.3 x
3.6 x 4.7 cm. This is incompletely evaluated without contrast.
Recommend OBGYN referral and pelvic ultrasound and/or dedicated
pelvic MRI with and without contrast for further evaluation.

Additionally, there is a T2 hyperintense bony lesion in the left
ischium with benign features but technically indeterminate. This can
also be further evaluated with contrast enhanced pelvic MRI as
recommended above.

These results will be called to the ordering clinician or
representative by the Radiologist Assistant, and communication
documented in the PACS or [REDACTED].

## 2021-11-27 DIAGNOSIS — F320 Major depressive disorder, single episode, mild: Secondary | ICD-10-CM | POA: Diagnosis not present

## 2021-11-27 DIAGNOSIS — F430 Acute stress reaction: Secondary | ICD-10-CM | POA: Diagnosis not present

## 2021-12-10 DIAGNOSIS — F320 Major depressive disorder, single episode, mild: Secondary | ICD-10-CM | POA: Diagnosis not present

## 2021-12-10 DIAGNOSIS — F430 Acute stress reaction: Secondary | ICD-10-CM | POA: Diagnosis not present

## 2021-12-18 ENCOUNTER — Ambulatory Visit: Payer: BC Managed Care – PPO | Admitting: Gastroenterology

## 2021-12-18 DIAGNOSIS — F320 Major depressive disorder, single episode, mild: Secondary | ICD-10-CM | POA: Diagnosis not present

## 2021-12-18 DIAGNOSIS — F430 Acute stress reaction: Secondary | ICD-10-CM | POA: Diagnosis not present

## 2021-12-24 DIAGNOSIS — F320 Major depressive disorder, single episode, mild: Secondary | ICD-10-CM | POA: Diagnosis not present

## 2021-12-24 DIAGNOSIS — F430 Acute stress reaction: Secondary | ICD-10-CM | POA: Diagnosis not present

## 2021-12-25 ENCOUNTER — Ambulatory Visit: Payer: BC Managed Care – PPO | Admitting: Physician Assistant

## 2021-12-25 ENCOUNTER — Encounter: Payer: Self-pay | Admitting: Physician Assistant

## 2021-12-25 VITALS — BP 132/90 | HR 94 | Temp 97.9°F | Ht 66.0 in | Wt 135.5 lb

## 2021-12-25 DIAGNOSIS — A048 Other specified bacterial intestinal infections: Secondary | ICD-10-CM

## 2021-12-25 DIAGNOSIS — E876 Hypokalemia: Secondary | ICD-10-CM | POA: Diagnosis not present

## 2021-12-25 NOTE — Addendum Note (Signed)
Addended by: Erlene Quan on: 12/25/2021 03:44 PM   Modules accepted: Orders

## 2021-12-25 NOTE — Progress Notes (Signed)
Shirley Nunez is a 46 y.o. female here for a follow up of a pre-existing problem.  History of Present Illness:   Chief Complaint  Patient presents with   Abdominal Pain    Pt f/u on Epigastric pain has resolved. Completed medication.    HPI  H Pylori Infection Tolerated medications well. Denies any further significant issues but does have occasional diarrhea. She has been off of protonix for about 2-3 weeks.   Hypokalemia She is taking her potassium about 2-3 days per week. She is having some intermittent diarrhea. Denies fatigue, palpitations, weakness.   Past Medical History:  Diagnosis Date   Anxiety    Depression    HSV infection    on daily medication   Migraine    Sickle cell anemia (HCC)    Sickle cell trait     Social History   Tobacco Use   Smoking status: Never   Smokeless tobacco: Never  Vaping Use   Vaping Use: Never used  Substance Use Topics   Alcohol use: Yes    Alcohol/week: 2.0 standard drinks of alcohol    Types: 2 Glasses of wine per week    Comment: 2 x / month   Drug use: No    Past Surgical History:  Procedure Laterality Date   ENDOMETRIAL ABLATION     WISDOM TOOTH EXTRACTION      Family History  Problem Relation Age of Onset   Hypertension Mother    Depression Mother    Miscarriages / Stillbirths Mother    Hypertension Father    Hyperlipidemia Father    Diabetes Father    Arthritis Father    Stroke Father    Sickle cell anemia Sister    Alcohol abuse Maternal Grandmother    Arthritis Maternal Grandmother    Depression Maternal Grandmother    Diabetes Maternal Grandmother    Heart attack Maternal Grandmother    Hypertension Maternal Grandmother    Stroke Maternal Grandmother    Heart attack Maternal Grandfather    Lung cancer Paternal Grandmother        mets to brain   Alcohol abuse Paternal Grandfather    Heart attack Paternal Grandfather    Hypertension Paternal Grandfather    Stroke Paternal Grandfather    Colon  cancer Neg Hx     Allergies  Allergen Reactions   Mushroom Extract Complex Rash   Penicillins Rash    Current Medications:   Current Outpatient Medications:    hydrochlorothiazide (HYDRODIURIL) 25 MG tablet, Take 1 tablet (25 mg total) by mouth daily. (Patient taking differently: Take 25 mg by mouth daily as needed.), Disp: 90 tablet, Rfl: 1   Multiple Vitamin (MULTIVITAMIN) tablet, Take 1 tablet by mouth daily., Disp: , Rfl:    OVER THE COUNTER MEDICATION, Take 99 mg by mouth as needed. Potassium supplement on days she takes HCTZ, Disp: , Rfl:    valACYclovir (VALTREX) 500 MG tablet, Take 1 tablet (500 mg total) by mouth daily., Disp: 90 tablet, Rfl: 2   St Johns Wort 300 MG TABS, Take 600 mg by mouth daily in the afternoon. (Patient not taking: Reported on 12/25/2021), Disp: , Rfl:    Review of Systems:   ROS Negative unless otherwise specified per HPI.  Vitals:   Vitals:   12/25/21 1506  BP: (!) 132/90  Pulse: 94  Temp: 97.9 F (36.6 C)  TempSrc: Temporal  SpO2: 98%  Weight: 135 lb 8 oz (61.5 kg)  Height: '5\' 6"'$  (1.676 m)  Body mass index is 21.87 kg/m.  Physical Exam:   Physical Exam Vitals and nursing note reviewed.  Constitutional:      General: She is not in acute distress.    Appearance: She is well-developed. She is not ill-appearing or toxic-appearing.  Cardiovascular:     Rate and Rhythm: Normal rate and regular rhythm.     Pulses: Normal pulses.     Heart sounds: Normal heart sounds, S1 normal and S2 normal.  Pulmonary:     Effort: Pulmonary effort is normal.     Breath sounds: Normal breath sounds.  Abdominal:     General: Abdomen is flat. Bowel sounds are normal.     Palpations: Abdomen is soft.     Tenderness: There is no abdominal tenderness.  Skin:    General: Skin is warm and dry.  Neurological:     Mental Status: She is alert.     GCS: GCS eye subscore is 4. GCS verbal subscore is 5. GCS motor subscore is 6.  Psychiatric:         Speech: Speech normal.        Behavior: Behavior normal. Behavior is cooperative.     Assessment and Plan:   H. pylori infection Overall improved Update breath test to assess eradication Follow-up prn  Hypokalemia Update potassium level and assess need for supplementation   Inda Coke, PA-C

## 2021-12-25 NOTE — Patient Instructions (Addendum)
It was great to see you!  We will recheck your H Pylori test today.  Take care,  Inda Coke PA-C

## 2021-12-25 NOTE — Addendum Note (Signed)
Addended by: Loura Back on: 12/25/2021 04:01 PM   Modules accepted: Orders

## 2021-12-26 LAB — BASIC METABOLIC PANEL
BUN: 9 mg/dL (ref 6–23)
CO2: 31 mEq/L (ref 19–32)
Calcium: 9.2 mg/dL (ref 8.4–10.5)
Chloride: 103 mEq/L (ref 96–112)
Creatinine, Ser: 0.71 mg/dL (ref 0.40–1.20)
GFR: 101.27 mL/min (ref >60.00)
Glucose, Bld: 88 mg/dL (ref 70–99)
Potassium: 3.3 mEq/L — ABNORMAL LOW (ref 3.5–5.1)
Sodium: 142 mEq/L (ref 135–145)

## 2021-12-29 LAB — H. PYLORI BREATH TEST: H. pylori Breath Test: NOT DETECTED

## 2021-12-31 DIAGNOSIS — F320 Major depressive disorder, single episode, mild: Secondary | ICD-10-CM | POA: Diagnosis not present

## 2021-12-31 DIAGNOSIS — F430 Acute stress reaction: Secondary | ICD-10-CM | POA: Diagnosis not present

## 2022-01-08 DIAGNOSIS — F320 Major depressive disorder, single episode, mild: Secondary | ICD-10-CM | POA: Diagnosis not present

## 2022-01-08 DIAGNOSIS — F430 Acute stress reaction: Secondary | ICD-10-CM | POA: Diagnosis not present

## 2022-01-17 DIAGNOSIS — F320 Major depressive disorder, single episode, mild: Secondary | ICD-10-CM | POA: Diagnosis not present

## 2022-01-17 DIAGNOSIS — F430 Acute stress reaction: Secondary | ICD-10-CM | POA: Diagnosis not present

## 2022-01-22 DIAGNOSIS — F331 Major depressive disorder, recurrent, moderate: Secondary | ICD-10-CM | POA: Diagnosis not present

## 2022-01-22 DIAGNOSIS — F4311 Post-traumatic stress disorder, acute: Secondary | ICD-10-CM | POA: Diagnosis not present

## 2022-01-29 DIAGNOSIS — F4311 Post-traumatic stress disorder, acute: Secondary | ICD-10-CM | POA: Diagnosis not present

## 2022-01-29 DIAGNOSIS — F331 Major depressive disorder, recurrent, moderate: Secondary | ICD-10-CM | POA: Diagnosis not present

## 2022-02-10 ENCOUNTER — Encounter: Payer: Self-pay | Admitting: Physician Assistant

## 2022-02-21 DIAGNOSIS — F331 Major depressive disorder, recurrent, moderate: Secondary | ICD-10-CM | POA: Diagnosis not present

## 2022-02-21 DIAGNOSIS — F4311 Post-traumatic stress disorder, acute: Secondary | ICD-10-CM | POA: Diagnosis not present

## 2022-03-03 DIAGNOSIS — F4311 Post-traumatic stress disorder, acute: Secondary | ICD-10-CM | POA: Diagnosis not present

## 2022-03-03 DIAGNOSIS — F331 Major depressive disorder, recurrent, moderate: Secondary | ICD-10-CM | POA: Diagnosis not present

## 2022-03-24 DIAGNOSIS — F4311 Post-traumatic stress disorder, acute: Secondary | ICD-10-CM | POA: Diagnosis not present

## 2022-03-24 DIAGNOSIS — F331 Major depressive disorder, recurrent, moderate: Secondary | ICD-10-CM | POA: Diagnosis not present

## 2022-04-16 DIAGNOSIS — F331 Major depressive disorder, recurrent, moderate: Secondary | ICD-10-CM | POA: Diagnosis not present

## 2022-04-16 DIAGNOSIS — F4311 Post-traumatic stress disorder, acute: Secondary | ICD-10-CM | POA: Diagnosis not present

## 2022-05-01 DIAGNOSIS — F4311 Post-traumatic stress disorder, acute: Secondary | ICD-10-CM | POA: Diagnosis not present

## 2022-05-01 DIAGNOSIS — F331 Major depressive disorder, recurrent, moderate: Secondary | ICD-10-CM | POA: Diagnosis not present

## 2022-05-19 DIAGNOSIS — F4311 Post-traumatic stress disorder, acute: Secondary | ICD-10-CM | POA: Diagnosis not present

## 2022-05-19 DIAGNOSIS — F331 Major depressive disorder, recurrent, moderate: Secondary | ICD-10-CM | POA: Diagnosis not present

## 2022-05-28 ENCOUNTER — Ambulatory Visit: Payer: BC Managed Care – PPO | Admitting: Physician Assistant

## 2022-06-11 ENCOUNTER — Encounter: Payer: Self-pay | Admitting: Physician Assistant

## 2022-06-11 ENCOUNTER — Ambulatory Visit: Payer: BC Managed Care – PPO | Admitting: Physician Assistant

## 2022-06-11 VITALS — BP 120/86 | HR 79 | Temp 97.5°F | Ht 66.0 in | Wt 149.2 lb

## 2022-06-11 DIAGNOSIS — F320 Major depressive disorder, single episode, mild: Secondary | ICD-10-CM | POA: Diagnosis not present

## 2022-06-11 DIAGNOSIS — F331 Major depressive disorder, recurrent, moderate: Secondary | ICD-10-CM | POA: Diagnosis not present

## 2022-06-11 DIAGNOSIS — I10 Essential (primary) hypertension: Secondary | ICD-10-CM

## 2022-06-11 DIAGNOSIS — E876 Hypokalemia: Secondary | ICD-10-CM

## 2022-06-11 DIAGNOSIS — F4311 Post-traumatic stress disorder, acute: Secondary | ICD-10-CM | POA: Diagnosis not present

## 2022-06-11 DIAGNOSIS — M25511 Pain in right shoulder: Secondary | ICD-10-CM

## 2022-06-11 DIAGNOSIS — G8929 Other chronic pain: Secondary | ICD-10-CM

## 2022-06-11 MED ORDER — CITALOPRAM HYDROBROMIDE 10 MG PO TABS: 5.0000 mg | ORAL_TABLET | Freq: Every day | ORAL | 1 refills | Status: DC

## 2022-06-11 MED ORDER — ALPRAZOLAM 0.25 MG PO TABS: 0.2500 mg | ORAL_TABLET | Freq: Two times a day (BID) | ORAL | 1 refills | Status: DC | PRN

## 2022-06-11 MED ORDER — VALACYCLOVIR HCL 500 MG PO TABS: 500.0000 mg | ORAL_TABLET | Freq: Every day | ORAL | 2 refills | Status: DC

## 2022-06-11 MED ORDER — HYDROCHLOROTHIAZIDE 25 MG PO TABS: 25.0000 mg | ORAL_TABLET | Freq: Every day | ORAL | 3 refills | Status: DC | PRN

## 2022-06-11 NOTE — Patient Instructions (Signed)
It was great to see you!  Start '5mg'$  celexa Follow-up with me 1 in month  Referral to Dr Tomie China for your shoulder  If you develop suicidal thoughts, please tell someone and immediately proceed to our local 24/7 crisis center, [facility] at the [facility]. [address], [place], [place] [address] [phone].  Take care,  Inda Coke PA-C

## 2022-06-11 NOTE — Progress Notes (Signed)
Shirley Nunez is a 47 y.o. female here for a medication refill.  History of Present Illness:   Chief Complaint  Patient presents with   Anxiety   Depression    Pt wants to discuss medication   Arm Pain    Pt c/o right arm pain, x 2 months, having trouble with ROM.     HPI  Depression Her mood has worsened since the death of her father on 06/07/2022. She is planning to go on short-term disability in order to stabilize her mood. She is requesting a refill of her Xanax. We have tried zoloft in the past however it caused excessive sweating. She is interested in trialing Celexa.  Right Shoulder Pain Two months Denies any events that could've caused injury. Notes limited range of motion She is R handed Feels weakness in hand due to pain Has pain near her bicep  HTN Currently taking HCTZ 12.5 mg prn. At home blood pressure readings are: not checked. Patient denies chest pain, SOB, blurred vision, dizziness, unusual headaches, lower leg swelling. Patient is compliant with medication. Denies excessive caffeine intake, stimulant usage, excessive alcohol intake, or increase in salt consumption.  BP Readings from Last 3 Encounters:  06/11/22 120/86  12/25/21 (!) 132/90  11/19/21 108/70      Past Medical History:  Diagnosis Date   Anxiety    Depression    HSV infection    on daily medication   Migraine    Sickle cell anemia (HCC)    Sickle cell trait     Social History   Tobacco Use   Smoking status: Never   Smokeless tobacco: Never  Vaping Use   Vaping Use: Never used  Substance Use Topics   Alcohol use: Yes    Alcohol/week: 2.0 standard drinks of alcohol    Types: 2 Glasses of wine per week    Comment: 2 x / month   Drug use: No    Past Surgical History:  Procedure Laterality Date   ENDOMETRIAL ABLATION     WISDOM TOOTH EXTRACTION      Family History  Problem Relation Age of Onset   Hypertension Mother    Depression Mother    Miscarriages / Stillbirths  Mother    Hypertension Father    Hyperlipidemia Father    Diabetes Father    Arthritis Father    Stroke Father    Sickle cell anemia Sister    Alcohol abuse Maternal Grandmother    Arthritis Maternal Grandmother    Depression Maternal Grandmother    Diabetes Maternal Grandmother    Heart attack Maternal Grandmother    Hypertension Maternal Grandmother    Stroke Maternal Grandmother    Heart attack Maternal Grandfather    Lung cancer Paternal Grandmother        mets to brain   Alcohol abuse Paternal Grandfather    Heart attack Paternal Grandfather    Hypertension Paternal Grandfather    Stroke Paternal Grandfather    Colon cancer Neg Hx     Allergies  Allergen Reactions   Mushroom Extract Complex Rash   Penicillins Rash    Current Medications:   Current Outpatient Medications:    ALPRAZolam (XANAX) 0.25 MG tablet, Take 1 tablet (0.25 mg total) by mouth 2 (two) times daily as needed for anxiety., Disp: 30 tablet, Rfl: 1   citalopram (CELEXA) 10 MG tablet, Take 0.5 tablets (5 mg total) by mouth daily., Disp: 30 tablet, Rfl: 1   Multiple Vitamin (MULTIVITAMIN) tablet, Take  1 tablet by mouth daily., Disp: , Rfl:    hydrochlorothiazide (HYDRODIURIL) 25 MG tablet, Take 1 tablet (25 mg total) by mouth daily as needed., Disp: 90 tablet, Rfl: 3   valACYclovir (VALTREX) 500 MG tablet, Take 1 tablet (500 mg total) by mouth daily., Disp: 90 tablet, Rfl: 2   Review of Systems:   Review of Systems  Constitutional:  Negative for fever and malaise/fatigue.  HENT:  Negative for congestion.   Eyes:  Negative for blurred vision.  Respiratory:  Negative for cough and shortness of breath.   Cardiovascular:  Negative for chest pain, palpitations and leg swelling.  Gastrointestinal:  Negative for vomiting.  Musculoskeletal:  Positive for joint pain (Right shoulder) and neck pain (Tenderness on back of neck). Negative for back pain.  Skin:  Negative for rash.  Neurological:  Negative for  loss of consciousness and headaches.    Vitals:   Vitals:   06/11/22 1410  BP: 120/86  Pulse: 79  Temp: (!) 97.5 F (36.4 C)  TempSrc: Temporal  SpO2: 97%  Weight: 149 lb 4 oz (67.7 kg)  Height: '5\' 6"'$  (1.676 m)     Body mass index is 24.09 kg/m.  Physical Exam:   Physical Exam Vitals and nursing note reviewed.  Constitutional:      General: She is not in acute distress.    Appearance: She is well-developed. She is not ill-appearing or toxic-appearing.  Cardiovascular:     Rate and Rhythm: Normal rate and regular rhythm.     Pulses: Normal pulses.     Heart sounds: Normal heart sounds, S1 normal and S2 normal.  Pulmonary:     Effort: Pulmonary effort is normal.     Breath sounds: Normal breath sounds.  Musculoskeletal:     Comments: Limited abduction of arm due to pain Tenderness to R upper arm, lateral aspect  Skin:    General: Skin is warm and dry.  Neurological:     Mental Status: She is alert.     GCS: GCS eye subscore is 4. GCS verbal subscore is 5. GCS motor subscore is 6.     Comments: Grip strength 4/5 on R side and 5/5 on L  Psychiatric:        Speech: Speech normal.        Behavior: Behavior normal. Behavior is cooperative.     Assessment and Plan:   Acute pain of right shoulder Referral to ortho  Hypokalemia Update blood work today and provide recommendations accordingly  Essential hypertension Normotensive Continue HCTZ 12.5 mg prn Follow-up in 6 mo, sooner if concerns  Depression, major, single episode, mild (HCC) No red flags Trial 5 mg celexa daily Follow-up in 1 months Denies SI/HI I discussed with patient that if they develop any SI, to tell someone immediately and seek medical attention. Continue talk therapy  I,Alexander Ruley,acting as a scribe for [employer], PA.,have documented all relevant documentation on the behalf of Inda Coke, PA,as directed by  Inda Coke, PA while in the presence of Inda Coke,  [place].  I, Inda Coke, [place], have reviewed all documentation for this visit. The documentation on 06/11/22 for the exam, diagnosis, procedures, and orders are all accurate and complete.  Inda Coke, PA-C

## 2022-06-12 ENCOUNTER — Other Ambulatory Visit: Payer: Self-pay | Admitting: Physician Assistant

## 2022-06-12 DIAGNOSIS — Z1231 Encounter for screening mammogram for malignant neoplasm of breast: Secondary | ICD-10-CM

## 2022-06-12 LAB — BASIC METABOLIC PANEL
BUN: 10 mg/dL (ref 6–23)
CO2: 32 mEq/L (ref 19–32)
Calcium: 9.2 mg/dL (ref 8.4–10.5)
Chloride: 102 mEq/L (ref 96–112)
Creatinine, Ser: 0.72 mg/dL (ref 0.40–1.20)
GFR: 99.26 mL/min (ref >60.00)
Glucose, Bld: 81 mg/dL (ref 70–99)
Potassium: 3.9 mEq/L (ref 3.5–5.1)
Sodium: 142 mEq/L (ref 135–145)

## 2022-06-16 ENCOUNTER — Ambulatory Visit (INDEPENDENT_AMBULATORY_CARE_PROVIDER_SITE_OTHER): Payer: BC Managed Care – PPO | Admitting: Sports Medicine

## 2022-06-16 ENCOUNTER — Encounter: Payer: Self-pay | Admitting: Physician Assistant

## 2022-06-16 ENCOUNTER — Ambulatory Visit: Payer: Self-pay

## 2022-06-16 ENCOUNTER — Ambulatory Visit (INDEPENDENT_AMBULATORY_CARE_PROVIDER_SITE_OTHER): Payer: BC Managed Care – PPO

## 2022-06-16 ENCOUNTER — Ambulatory Visit (INDEPENDENT_AMBULATORY_CARE_PROVIDER_SITE_OTHER): Payer: BC Managed Care – PPO | Admitting: Physician Assistant

## 2022-06-16 DIAGNOSIS — M25511 Pain in right shoulder: Secondary | ICD-10-CM | POA: Diagnosis not present

## 2022-06-16 DIAGNOSIS — M7501 Adhesive capsulitis of right shoulder: Secondary | ICD-10-CM | POA: Diagnosis not present

## 2022-06-16 DIAGNOSIS — G8929 Other chronic pain: Secondary | ICD-10-CM

## 2022-06-16 MED ORDER — METHYLPREDNISOLONE ACETATE 40 MG/ML IJ SUSP
80.0000 mg | INTRAMUSCULAR | Status: AC | PRN
  Administered 2022-06-16: 80 mg via INTRA_ARTICULAR

## 2022-06-16 MED ORDER — TRAMADOL HCL 50 MG PO TABS: 50.0000 mg | ORAL_TABLET | Freq: Two times a day (BID) | ORAL | 2 refills | Status: DC | PRN

## 2022-06-16 MED ORDER — BUPIVACAINE HCL 0.25 % IJ SOLN
2.0000 mL | INTRAMUSCULAR | Status: AC | PRN
  Administered 2022-06-16: 2 mL via INTRA_ARTICULAR

## 2022-06-16 MED ORDER — LIDOCAINE HCL 1 % IJ SOLN
2.0000 mL | INTRAMUSCULAR | Status: AC | PRN
  Administered 2022-06-16: 2 mL

## 2022-06-16 NOTE — Progress Notes (Signed)
   Procedure Note  Patient: Shirley Nunez             Date of Birth: 12-Oct-1975           MRN: [id number]             Visit Date: 06/16/2022  Procedures: Visit Diagnoses:  1. Chronic right shoulder pain    Large Joint Inj: R glenohumeral on 06/16/2022 11:17 AM Indications: pain Details: 22 G 3.5 in needle, ultrasound-guided posterior approach Medications: 2 mL lidocaine 1 %; 2 mL bupivacaine 0.25 %; 80 mg methylPREDNISolone acetate 40 MG/ML Outcome: tolerated well, no immediate complications  US-guided glenohumeral joint injection, right shoulder After discussion on risks/benefits/indications, informed verbal consent was obtained. A timeout was then performed. The patient was positioned lying lateral recumbent on examination table. The patient's shoulder was prepped with betadine and multiple alcohol swabs and utilizing ultrasound guidance, the patient's glenohumeral joint was identified on ultrasound. Using ultrasound guidance a 22-gauge, 3.5 inch needle with a mixture of 2:2:2 cc's lidocaine:bupivicaine:depomedrol was directed from a lateral to medial direction via in-plane technique into the glenohumeral joint with visualization of appropriate spread of injectate into the joint. Patient tolerated the procedure well without immediate complications.      Procedure, treatment alternatives, risks and benefits explained, specific risks discussed. Consent was given by the patient. Immediately prior to procedure a time out was called to verify the correct patient, procedure, equipment, support staff and site/side marked as required. Patient was prepped and draped in the usual sterile fashion.    - I evaluated the patient about 10 minutes post-injection and she had improvement in pain and range of motion - she will f/u with me in 2 weeks, if not > 80% improved will consider 1 additional GHJ injection - follow-up with Mendel Ryder as indicated in 4 weeks  Elba Barman, DO [place]  This note was dictated using Dragon naturally speaking software and may contain errors in syntax, spelling, or content which have not been identified prior to signing this note.

## 2022-06-16 NOTE — Progress Notes (Signed)
Office Visit Note   Patient: Shirley Nunez           Date of Birth: [birth year]           MRN: [id number] Visit Date: 06/16/2022              Requested by: Inda Coke, [place] [address] [place],  [place] [address] PCP: Inda Coke, [place]   Assessment & Plan: Visit Diagnoses:  1. Adhesive capsulitis of right shoulder     Plan: Impression is right shoulder adhesive capsulitis.  Today, discussed referring the patient to Dr. Rolena Infante for ultrasound-guided cortisone injection followed by course of outpatient physical therapy.  She is agreeable to this plan.  She will follow-up with [place] in 4 weeks for recheck.  Call with concerns or questions in the meantime.  Follow-Up Instructions: Return in about 4 weeks (around 07/14/2022).   Orders:  Orders Placed This Encounter  Procedures   XR Shoulder Right   No orders of the defined types were placed in this encounter.     Procedures: No procedures performed   Clinical Data: No additional findings.   Subjective: Chief Complaint  Patient presents with   Right Shoulder - Pain    HPI patient is a pleasant 47 year old female who comes in today with right shoulder pain for the past month.  She denies any injury or change in activity.  She notes that her symptoms have worsened over the past few weeks to the point where she has very limited range of motion.  The pain she has is to the entire shoulder with associated weakness.  Any movement of the shoulder seems to worsen her symptoms.  She has been taking Tylenol without relief.  She does note occasional tingling to her hand and arm.  No history of neck pathology.  No previous cortisone injection of the right shoulder.  Review of Systems as detailed in HPI.  All others reviewed and are negative.   Objective: Vital Signs: There were no vitals taken for this visit.  Physical Exam well-developed and well-nourished female in no acute distress.  Alert and oriented x 3.  Ortho Exam  right shoulder exam reveals active forward flexion to approximately 75 degrees.  I can passively get her to about 100 degrees.  Very little internal/external rotation.  She is neurovascular intact distally.  Specialty Comments:  No specialty comments available.  Imaging: XR Shoulder Right  Result Date: 06/16/2022 No acute or structural abnormalities    PMFS History: Patient Active Problem List   Diagnosis Date Noted   Pain in left hip 10/11/2020   Depression, major, single episode, mild ([place]) 05/04/2018   Insomnia 05/04/2018   Migraine 07/16/2017   Joint stiffness 11/09/2015   Herpes simplex of female genitalia 08/16/2014   Essential hypertension 07/12/2014   Sickle cell trait ([place]) 07/12/2014   Past Medical History:  Diagnosis Date   Anxiety    Depression    HSV infection    on daily medication   Migraine    Sickle cell anemia (HCC)    Sickle cell trait    Family History  Problem Relation Age of Onset   Hypertension Mother    Depression Mother    Miscarriages / [place] Mother    Hypertension Father    Hyperlipidemia Father    Diabetes Father    Arthritis Father    Stroke Father    Sickle cell anemia Sister    Alcohol abuse Maternal Grandmother    Arthritis  Maternal Grandmother    Depression Maternal Grandmother    Diabetes Maternal Grandmother    Heart attack Maternal Grandmother    Hypertension Maternal Grandmother    Stroke Maternal Grandmother    Heart attack Maternal Grandfather    Lung cancer Paternal Grandmother        mets to brain   Alcohol abuse Paternal Grandfather    Heart attack Paternal Grandfather    Hypertension Paternal Grandfather    Stroke Paternal Grandfather    Colon cancer Neg Hx     Past Surgical History:  Procedure Laterality Date   ENDOMETRIAL ABLATION     WISDOM TOOTH EXTRACTION     Social History   Occupational History   Not on file  Tobacco Use   Smoking status: Never   Smokeless tobacco: Never  Vaping Use    Vaping Use: Never used  Substance and Sexual Activity   Alcohol use: Yes    Alcohol/week: 2.0 standard drinks of alcohol    Types: 2 Glasses of wine per week    Comment: 2 x / month   Drug use: No   Sexual activity: Yes    Birth control/protection: Other-see comments, None    Comment: ablation

## 2022-06-17 ENCOUNTER — Ambulatory Visit
Admission: RE | Admit: 2022-06-17 | Discharge: 2022-06-17 | Disposition: A | Discharge status: Home / Self Care | Payer: BC Managed Care – PPO | Source: Ambulatory Visit | Attending: Physician Assistant | Admitting: Physician Assistant

## 2022-06-17 DIAGNOSIS — F331 Major depressive disorder, recurrent, moderate: Secondary | ICD-10-CM | POA: Diagnosis not present

## 2022-06-17 DIAGNOSIS — Z1231 Encounter for screening mammogram for malignant neoplasm of breast: Secondary | ICD-10-CM

## 2022-06-17 DIAGNOSIS — F4311 Post-traumatic stress disorder, acute: Secondary | ICD-10-CM | POA: Diagnosis not present

## 2022-06-23 ENCOUNTER — Encounter: Payer: Self-pay | Admitting: Physician Assistant

## 2022-06-23 ENCOUNTER — Other Ambulatory Visit: Payer: Self-pay | Admitting: Physician Assistant

## 2022-06-23 DIAGNOSIS — D369 Benign neoplasm, unspecified site: Secondary | ICD-10-CM

## 2022-06-27 DIAGNOSIS — F4311 Post-traumatic stress disorder, acute: Secondary | ICD-10-CM | POA: Diagnosis not present

## 2022-06-27 DIAGNOSIS — F331 Major depressive disorder, recurrent, moderate: Secondary | ICD-10-CM | POA: Diagnosis not present

## 2022-06-30 ENCOUNTER — Ambulatory Visit: Payer: BC Managed Care – PPO | Admitting: Physical Therapy

## 2022-06-30 ENCOUNTER — Telehealth: Payer: Self-pay | Admitting: *Deleted

## 2022-06-30 NOTE — Telephone Encounter (Signed)
Spoke to pt asked her what start date is and end date for FMLA? Pt said start date is 06/16/2022 and end date is 08/18/2022. Told her okay I will fax FMLA over to [place] today. Pt verbalized understanding.  FMLA faxed to [facility]

## 2022-07-03 ENCOUNTER — Other Ambulatory Visit: Payer: Self-pay | Admitting: Physician Assistant

## 2022-07-03 ENCOUNTER — Ambulatory Visit
Admission: RE | Admit: 2022-07-03 | Discharge: 2022-07-03 | Disposition: A | Discharge status: Home / Self Care | Payer: BC Managed Care – PPO | Source: Ambulatory Visit | Attending: Physician Assistant | Admitting: Physician Assistant

## 2022-07-03 DIAGNOSIS — D369 Benign neoplasm, unspecified site: Secondary | ICD-10-CM

## 2022-07-03 DIAGNOSIS — R921 Mammographic calcification found on diagnostic imaging of breast: Secondary | ICD-10-CM

## 2022-07-03 DIAGNOSIS — N6315 Unspecified lump in the right breast, overlapping quadrants: Secondary | ICD-10-CM | POA: Diagnosis not present

## 2022-07-03 DIAGNOSIS — N6321 Unspecified lump in the left breast, upper outer quadrant: Secondary | ICD-10-CM | POA: Diagnosis not present

## 2022-07-03 DIAGNOSIS — N6310 Unspecified lump in the right breast, unspecified quadrant: Secondary | ICD-10-CM

## 2022-07-03 DIAGNOSIS — N6322 Unspecified lump in the left breast, upper inner quadrant: Secondary | ICD-10-CM | POA: Diagnosis not present

## 2022-07-07 ENCOUNTER — Ambulatory Visit: Payer: BC Managed Care – PPO | Admitting: Physician Assistant

## 2022-07-07 ENCOUNTER — Encounter: Payer: Self-pay | Admitting: Physician Assistant

## 2022-07-07 VITALS — BP 120/80 | HR 90 | Temp 97.7°F | Ht 66.0 in | Wt 148.4 lb

## 2022-07-07 DIAGNOSIS — F320 Major depressive disorder, single episode, mild: Secondary | ICD-10-CM

## 2022-07-07 MED ORDER — DIAZEPAM 5 MG PO TABS: 5.0000 mg | ORAL_TABLET | Freq: Two times a day (BID) | ORAL | 1 refills | Status: DC | PRN

## 2022-07-07 NOTE — Patient Instructions (Addendum)
It was great to see you!  [facility] Located in: [facility] Address: [address], [place], [place] [address] Phone: [phone]  [facility] Address: [address], [place], [place] [address] Phone: [phone]  Increase celexa to 10 mg and in two weeks increase to 20 mg.  Trial the valium instead of xanax.  Follow-up in 1 month.  Take care,  Inda Coke PA-C

## 2022-07-07 NOTE — Progress Notes (Signed)
Shirley Nunez is a 47 y.o. female here for a follow up of a pre-existing problem.  History of Present Illness:   Chief Complaint  Patient presents with   Depression    Depression         Depression Started celexa 5 mg Sweating more and dull HA at times Hasn't felt any different with taking this medication Currently seeing Melanie for talk therapy, whom she has been seeing for the past 18 months -- however she is being told that this provider is not sufficient for her EAP Walking with an accountability friend for exercising (walking) and getting out of her house Denies SI/HI  Past Medical History:  Diagnosis Date   Anxiety    Depression    HSV infection    on daily medication   Migraine    Sickle cell anemia (HCC)    Sickle cell trait     Social History   Tobacco Use   Smoking status: Never   Smokeless tobacco: Never  Vaping Use   Vaping Use: Never used  Substance Use Topics   Alcohol use: Yes    Alcohol/week: 2.0 standard drinks of alcohol    Types: 2 Glasses of wine per week    Comment: 2 x / month   Drug use: No    Past Surgical History:  Procedure Laterality Date   ENDOMETRIAL ABLATION     WISDOM TOOTH EXTRACTION      Family History  Problem Relation Age of Onset   Hypertension Mother    Depression Mother    Miscarriages / Stillbirths Mother    Hypertension Father    Hyperlipidemia Father    Diabetes Father    Arthritis Father    Stroke Father    Sickle cell anemia Sister    Alcohol abuse Maternal Grandmother    Arthritis Maternal Grandmother    Depression Maternal Grandmother    Diabetes Maternal Grandmother    Heart attack Maternal Grandmother    Hypertension Maternal Grandmother    Stroke Maternal Grandmother    Heart attack Maternal Grandfather    Lung cancer Paternal Grandmother        mets to brain   Alcohol abuse Paternal Grandfather    Heart attack Paternal Grandfather    Hypertension Paternal Grandfather    Stroke Paternal  Grandfather    Colon cancer Neg Hx     Allergies  Allergen Reactions   Mushroom Extract Complex Rash   Penicillins Rash    Current Medications:   Current Outpatient Medications:    citalopram (CELEXA) 10 MG tablet, Take 0.5 tablets (5 mg total) by mouth daily., Disp: 30 tablet, Rfl: 1   diazepam (VALIUM) 5 MG tablet, Take 1 tablet (5 mg total) by mouth every 12 (twelve) hours as needed for anxiety., Disp: 30 tablet, Rfl: 1   hydrochlorothiazide (HYDRODIURIL) 25 MG tablet, Take 1 tablet (25 mg total) by mouth daily as needed., Disp: 90 tablet, Rfl: 3   valACYclovir (VALTREX) 500 MG tablet, Take 1 tablet (500 mg total) by mouth daily., Disp: 90 tablet, Rfl: 2   Review of Systems:   Review of Systems  Psychiatric/Behavioral:  Positive for depression.    Negative unless otherwise specified per HPI.  Vitals:   Vitals:   07/07/22 1402  BP: 120/80  Pulse: 90  Temp: 97.7 F (36.5 C)  TempSrc: Temporal  SpO2: 97%  Weight: 148 lb 6.1 oz (67.3 kg)  Height: '5\' 6"'$  (1.676 m)     Body mass  index is 23.95 kg/m.  Physical Exam:   Physical Exam Vitals and nursing note reviewed.  Constitutional:      General: She is not in acute distress.    Appearance: She is well-developed. She is not ill-appearing or toxic-appearing.  Cardiovascular:     Rate and Rhythm: Normal rate and regular rhythm.     Pulses: Normal pulses.     Heart sounds: Normal heart sounds, S1 normal and S2 normal.  Pulmonary:     Effort: Pulmonary effort is normal.     Breath sounds: Normal breath sounds.  Skin:    General: Skin is warm and dry.  Neurological:     Mental Status: She is alert.     GCS: GCS eye subscore is 4. GCS verbal subscore is 5. GCS motor subscore is 6.  Psychiatric:        Speech: Speech normal.        Behavior: Behavior normal. Behavior is cooperative.     Assessment and Plan:   Depression, major, single episode, mild (HCC) No red flags  Increase celexa to 10 mg daily I  discussed with patient that if they develop any SI, to tell someone immediately and seek medical attention. Psychiatry referral placed Follow-up in 1 month with [place] (or psychiatry) -- sooner if concerns  Time spent with patient today was 50 minutes which consisted of chart review, discussing diagnosis, work up, treatment answering questions and documentation.  Inda Coke, PA-C

## 2022-07-11 ENCOUNTER — Telehealth: Payer: Self-pay | Admitting: Physician Assistant

## 2022-07-11 NOTE — Telephone Encounter (Signed)
..  Type of form received: FMLA  Additional comments:   Received by: Adonis Brook  Form should be Faxed to: [phone]  Form should be mailed to:    Is patient requesting call for pickup:   Form placed:  In provider's box  Attach charge sheet. yes  Individual made aware of 3-5 business day turn around (Y/N)?

## 2022-07-14 NOTE — Telephone Encounter (Signed)
FMLA paperwork and office notes faxed over to [employer] on 07/11/2022.

## 2022-07-14 NOTE — Telephone Encounter (Signed)
Received FMLA paperwork and it is different than what was filled out and faxed. Will have Samantha complete when she returns tomorrow.

## 2022-07-14 NOTE — Telephone Encounter (Addendum)
Spoke to pt told her disregard message I sent. The FMLA paperwork they sent over this time asks different questions. Asked pt if she is seeing a Psychiatrist? Pt said she never heard from the referral [place] placed, but her EAP reached out to her and she scheduled to see therapist Hal Hope on 07/31/2022. Told her okay, Aldona Bar will be back tomorrow and I will have her complete. Pt verbalized understanding.

## 2022-07-16 NOTE — Telephone Encounter (Signed)
FMLA forms completed and faxed to [facility] along with office notes.

## 2022-07-31 ENCOUNTER — Ambulatory Visit: Payer: BC Managed Care – PPO | Admitting: Physician Assistant

## 2022-07-31 ENCOUNTER — Encounter: Payer: Self-pay | Admitting: Physician Assistant

## 2022-07-31 VITALS — BP 160/110 | HR 79

## 2022-07-31 DIAGNOSIS — F322 Major depressive disorder, single episode, severe without psychotic features: Secondary | ICD-10-CM

## 2022-07-31 DIAGNOSIS — F4312 Post-traumatic stress disorder, chronic: Secondary | ICD-10-CM | POA: Diagnosis not present

## 2022-07-31 DIAGNOSIS — F411 Generalized anxiety disorder: Secondary | ICD-10-CM | POA: Diagnosis not present

## 2022-07-31 DIAGNOSIS — I10 Essential (primary) hypertension: Secondary | ICD-10-CM

## 2022-07-31 DIAGNOSIS — F332 Major depressive disorder, recurrent severe without psychotic features: Secondary | ICD-10-CM | POA: Diagnosis not present

## 2022-07-31 NOTE — Progress Notes (Signed)
Shirley Nunez is a 47 y.o. female here for a follow up of a pre-existing problem.  History of Present Illness:   Chief Complaint  Patient presents with   Depression    Pt is doing okay. She saw Psychiatrist this morning, virtual visit.    Depression         HTN Currently taking HCTZ 25 mg prn. At home blood pressure readings are: not checked . Patient denies chest pain, SOB, blurred vision, dizziness, unusual headaches, lower leg swelling. Patient is overall compliant with medication. Denies excessive caffeine intake, stimulant usage, excessive alcohol intake, or increase in salt consumption.  BP Readings from Last 3 Encounters:  07/31/22 (!) 160/110  07/07/22 120/80  06/11/22 120/86    PTSD; Depression Saw psychiatry Ernie Hew with [facility]) for first visit this morning, was increased citalopram to 20 mg and is starting on prazosin 1 mg Planning to consider intensive outpatient therapy per her psychiatrist's recommendation Continues with talk therapy Tried to go into work a week ago to get something out of her desk and had palpitations/SOB and ended up having panic attack Does not feel ready to go back to work Spending most of days in bed    Past Medical History:  Diagnosis Date   Anxiety    Depression    HSV infection    on daily medication   Migraine    Sickle cell anemia (HCC)    Sickle cell trait     Social History   Tobacco Use   Smoking status: Never   Smokeless tobacco: Never  Vaping Use   Vaping Use: Never used  Substance Use Topics   Alcohol use: Yes    Alcohol/week: 2.0 standard drinks of alcohol    Types: 2 Glasses of wine per week    Comment: 2 x / month   Drug use: No    Past Surgical History:  Procedure Laterality Date   ENDOMETRIAL ABLATION     WISDOM TOOTH EXTRACTION      Family History  Problem Relation Age of Onset   Hypertension Mother    Depression Mother    Miscarriages / Stillbirths Mother    Hypertension  Father    Hyperlipidemia Father    Diabetes Father    Arthritis Father    Stroke Father    Sickle cell anemia Sister    Alcohol abuse Maternal Grandmother    Arthritis Maternal Grandmother    Depression Maternal Grandmother    Diabetes Maternal Grandmother    Heart attack Maternal Grandmother    Hypertension Maternal Grandmother    Stroke Maternal Grandmother    Heart attack Maternal Grandfather    Lung cancer Paternal Grandmother        mets to brain   Alcohol abuse Paternal Grandfather    Heart attack Paternal Grandfather    Hypertension Paternal Grandfather    Stroke Paternal Grandfather    Colon cancer Neg Hx     Allergies  Allergen Reactions   Mushroom Extract Complex Rash   Penicillins Rash    Current Medications:   Current Outpatient Medications:    citalopram (CELEXA) 10 MG tablet, Take 0.5 tablets (5 mg total) by mouth daily., Disp: 30 tablet, Rfl: 1   diazepam (VALIUM) 5 MG tablet, Take 1 tablet (5 mg total) by mouth every 12 (twelve) hours as needed for anxiety., Disp: 30 tablet, Rfl: 1   hydrochlorothiazide (HYDRODIURIL) 25 MG tablet, Take 1 tablet (25 mg total) by mouth daily as needed.,  Disp: 90 tablet, Rfl: 3   valACYclovir (VALTREX) 500 MG tablet, Take 1 tablet (500 mg total) by mouth daily., Disp: 90 tablet, Rfl: 2   citalopram (CELEXA) 20 MG tablet, Take 20 mg by mouth daily. (Patient not taking: Reported on 07/31/2022), Disp: , Rfl:    prazosin (MINIPRESS) 1 MG capsule, Take 1 mg by mouth at bedtime. (Patient not taking: Reported on 07/31/2022), Disp: , Rfl:    Review of Systems:   Review of Systems  Psychiatric/Behavioral:  Positive for depression.    Negative unless otherwise specified per HPI.  Vitals:   Vitals:   07/31/22 1104 07/31/22 1147  BP: (!) 150/100 (!) 160/110  Pulse: 79   SpO2: 99%      There is no height or weight on file to calculate BMI.  Physical Exam:   Physical Exam Vitals and nursing note reviewed.  Constitutional:       General: She is not in acute distress.    Appearance: She is well-developed. She is not ill-appearing or toxic-appearing.  Cardiovascular:     Rate and Rhythm: Normal rate and regular rhythm.     Pulses: Normal pulses.     Heart sounds: Normal heart sounds, S1 normal and S2 normal.  Pulmonary:     Effort: Pulmonary effort is normal.     Breath sounds: Normal breath sounds.  Skin:    General: Skin is warm and dry.  Neurological:     Mental Status: She is alert.     GCS: GCS eye subscore is 4. GCS verbal subscore is 5. GCS motor subscore is 6.  Psychiatric:        Speech: Speech normal.        Behavior: Behavior normal. Behavior is cooperative.     Assessment and Plan:   Depression, major, single episode, severe ([place]) Uncontrolled Denies SI/HI Referral placed for IOP Mgmt per psychiatry and psychologist   Essential hypertension Above goal No evidence of end organ damage Recommend she keeps an eye on BP at home and if BP consistently above goal to call us for close follow-up  Follow-up in 3 months, sooner if concerns   Inda Coke, PA-C

## 2022-08-01 NOTE — Telephone Encounter (Signed)
Updated office notes faxed to [facility] at [phone] for pt's FMLA.

## 2022-08-19 ENCOUNTER — Telehealth (HOSPITAL_COMMUNITY): Payer: Self-pay | Admitting: Licensed Clinical Social Worker

## 2022-08-19 ENCOUNTER — Telehealth: Payer: Self-pay | Admitting: *Deleted

## 2022-08-19 NOTE — Telephone Encounter (Signed)
Received call from [place] with [employer] regarding pt's FMLA, Victorino Dike asking questions about visit 07/31/2022 patients behavior why she can not work. Purnell Shoemaker I am unable to answer I will have to have provider call you back. Victorino Dike verbalized understanding and said to call back at [phone] and ask for [facility] Nurse. Told her okay, I will have provider call you back.

## 2022-08-19 NOTE — Telephone Encounter (Signed)
Shirley Nunez, please call [name] at [phone] ask to speak to [facility] Nurse. They have questions about office visit 07/31/2022 as to why patient can not work.

## 2022-08-19 NOTE — Telephone Encounter (Signed)
Noted  

## 2022-08-21 ENCOUNTER — Other Ambulatory Visit (HOSPITAL_COMMUNITY): Payer: BC Managed Care – PPO | Attending: Psychiatry | Admitting: Professional

## 2022-08-21 DIAGNOSIS — F332 Major depressive disorder, recurrent severe without psychotic features: Secondary | ICD-10-CM | POA: Insufficient documentation

## 2022-08-21 DIAGNOSIS — F4310 Post-traumatic stress disorder, unspecified: Secondary | ICD-10-CM | POA: Insufficient documentation

## 2022-08-26 ENCOUNTER — Other Ambulatory Visit (HOSPITAL_COMMUNITY): Payer: BC Managed Care – PPO | Attending: Psychiatry

## 2022-08-26 ENCOUNTER — Other Ambulatory Visit (HOSPITAL_COMMUNITY): Payer: BC Managed Care – PPO | Attending: Psychiatry | Admitting: Licensed Clinical Social Worker

## 2022-08-26 DIAGNOSIS — F332 Major depressive disorder, recurrent severe without psychotic features: Secondary | ICD-10-CM | POA: Diagnosis not present

## 2022-08-26 DIAGNOSIS — R4589 Other symptoms and signs involving emotional state: Secondary | ICD-10-CM

## 2022-08-26 DIAGNOSIS — F411 Generalized anxiety disorder: Secondary | ICD-10-CM | POA: Insufficient documentation

## 2022-08-26 DIAGNOSIS — F4310 Post-traumatic stress disorder, unspecified: Secondary | ICD-10-CM | POA: Insufficient documentation

## 2022-08-26 DIAGNOSIS — Z79899 Other long term (current) drug therapy: Secondary | ICD-10-CM | POA: Insufficient documentation

## 2022-08-26 MED ORDER — SERTRALINE HCL 25 MG PO TABS: 25.0000 mg | ORAL_TABLET | Freq: Every day | ORAL | 0 refills | Status: DC

## 2022-08-26 MED ORDER — HYDROXYZINE HCL 25 MG PO TABS: 25.0000 mg | ORAL_TABLET | Freq: Every evening | ORAL | 0 refills | Status: DC | PRN
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
## 2022-08-26 NOTE — Psych (Signed)
Active     OP Depression     LTG: Reduce frequency, intensity, and duration of depression symptoms so that daily functioning is improved (Initial)     Start:  08/26/22    Expected End:  11/25/22         LTG: Increase coping skills to manage depression and improve ability to perform daily activities (Initial)     Start:  08/26/22    Expected End:  11/25/22         STG: [name] will attend at least 80% of scheduled PHP sessions    (Initial)     Start:  08/26/22    Expected End:  11/25/22         STG: [name] will complete at least 80% of assigned homework  (Initial)     Start:  08/26/22    Expected End:  11/25/22         STG: Emerita will identify cognitive patterns and beliefs that support depression (Initial)     Start:  08/26/22    Expected End:  11/25/22           Josslynn verbally agrees to treatment plan.

## 2022-08-27 ENCOUNTER — Encounter (HOSPITAL_COMMUNITY): Payer: Self-pay

## 2022-08-27 ENCOUNTER — Other Ambulatory Visit (HOSPITAL_COMMUNITY): Payer: BC Managed Care – PPO | Attending: Psychiatry | Admitting: Licensed Clinical Social Worker

## 2022-08-27 ENCOUNTER — Other Ambulatory Visit (HOSPITAL_COMMUNITY): Payer: BC Managed Care – PPO | Attending: Psychiatry

## 2022-08-27 DIAGNOSIS — F332 Major depressive disorder, recurrent severe without psychotic features: Secondary | ICD-10-CM | POA: Diagnosis not present

## 2022-08-27 DIAGNOSIS — F4310 Post-traumatic stress disorder, unspecified: Secondary | ICD-10-CM | POA: Diagnosis not present

## 2022-08-27 DIAGNOSIS — R4589 Other symptoms and signs involving emotional state: Secondary | ICD-10-CM

## 2022-08-27 NOTE — Therapy (Signed)
[facility] PARTIAL HOSPITALIZATION PROGRAM [address] [place], [place], [address] Phone: [phone]   Fax:  [phone]  Occupational Therapy Treatment Virtual Visit via Video Note  I connected with Shirley Nunez on 08/27/22 at  8:00 AM EDT by a video enabled telemedicine application and verified that I am speaking with the correct person using two identifiers.  Location: Patient: home Provider: office   I discussed the limitations of evaluation and management by telemedicine and the availability of in person appointments. The patient expressed understanding and agreed to proceed.    The patient was advised to call back or seek an in-person evaluation if the symptoms worsen or if the condition fails to improve as anticipated.  I provided 55 minutes of non-face-to-face time during this encounter.   Patient Details  Name: Shirley Nunez MRN: [id number] Date of Birth: March 21, 1976 No data recorded  Encounter Date: 08/27/2022   OT End of Session - 08/27/22 2131     Visit Number 2    Number of Visits 20    Date for OT Re-Evaluation 09/26/22    OT Start Time 1200    OT Stop Time 1255    OT Time Calculation (min) 55 min    Activity Tolerance Patient tolerated treatment well             Past Medical History:  Diagnosis Date   Anxiety    Depression    HSV infection    on daily medication   Migraine    Sickle cell anemia    Sickle cell trait    Past Surgical History:  Procedure Laterality Date   ENDOMETRIAL ABLATION     WISDOM TOOTH EXTRACTION      There were no vitals filed for this visit.   Subjective Assessment - 08/27/22 2131     Currently in Pain? No/denies    Pain Score 0-No pain                Group Session:  S: A bit better today. Yesterday was difficult. It was a lot. Today is better overall.   O: The objective of the telehealth group therapy session was to discuss the significance of routines in promoting  mental health and wellbeing. The OT aimed to explore the power of routines in providing structure, stability, and predictability in individuals' lives, as well as their role in establishing healthy habits, reducing stress and anxiety, managing time effectively, achieving goals, and fostering a sense of community and social connectedness.   Participants were guided to identify areas where routines could improve their mental health and were provided with tips for establishing and maintaining healthy routines. The session concluded by emphasizing the potential of routines to enhance overall quality of life.  Homework Assignment:  As part of the session, participants were assigned a homework task to reflect on their current routines and select one area of their lives where they could establish a new routine to improve their mental health and wellbeing. They were instructed to start small and implement the routine gradually, while seeking support from friends, family, or mental health professionals as needed. The participants were asked to report their progress in the next therapy session, focusing on the benefits and challenges encountered during the implementation of their chosen routine.   A: During the telehealth group therapy session, the patient actively participated in the discussion on the importance of routines in promoting mental health and wellbeing. The patient demonstrated a good understanding of  the power of routines in providing structure, stability, and predictability in their life. They were able to identify areas in their life where routines could contribute to improving their overall mental health.  The patient showed motivation and willingness to reflect on their current habits and behaviors, recognizing the need for positive changes. They actively engaged in the session, sharing personal experiences and challenges related to establishing and maintaining healthy routines. The patient  expressed a desire to reduce stress and anxiety, manage their time more effectively, and achieve their goals through the implementation of routines.  The patient actively participated in the discussion of tips for establishing and maintaining healthy routines. They demonstrated an understanding of the importance of starting small, being consistent, and gradually increasing the complexity of their routines. The patient expressed a willingness to remain flexible and adaptable, acknowledging that adjustments might be necessary as circumstances and priorities change over time.  The patient's active participation, understanding of the concepts discussed, and their motivation to implement positive changes in their life, it can be assessed that they have a good potential to benefit from incorporating healthy routines into their daily life. The patient's commitment to reflecting on their current routines and selecting an area for improvement indicates a proactive approach to their mental health and wellbeing. Continued support and guidance in implementing and maintaining the identified routine will be beneficial for the patient's overall progress.   P: Continue to attend PHP OT group sessions 5x week for 4 weeks to promote daily structure, social engagement, and opportunities to develop and utilize adaptive strategies to maximize functional performance in preparation for safe transition and integration back into school, work, and the community. Plan to address topic of pt 2 in next OT group session.                   OT Education - 08/27/22 2131     Education Details Routines              OT Short Term Goals - 08/27/22 2059       OT SHORT TERM GOAL #1   Title Client will independently seek out and participate in a community or online support group to reinforce coping strategies by time of discharge.    Time 4    Period Weeks    Status On-going    Target Date 09/26/22      OT  SHORT TERM GOAL #2   Title By discharge, client will demonstrate the ability to adapt and modify routines when faced with unexpected events or changes, maintaining a balanced approach to daily tasks.      OT SHORT TERM GOAL #3   Title By the time of discharge, client will independently set, track, and make progress towards a long-term goal, demonstrating resilience in overcoming obstacles and seeking support when needed.                      Plan - 08/27/22 2132     Psychosocial Skills Coping Strategies;Habits;Interpersonal Interaction;Routines and Behaviors             Patient will benefit from skilled therapeutic intervention in order to improve the following deficits and impairments:       Psychosocial Skills: Coping Strategies, Habits, Interpersonal Interaction, Routines and Behaviors   Visit Diagnosis: Difficulty coping    Problem List Patient Active Problem List   Diagnosis Date Noted   Major depressive disorder, recurrent episode, severe 08/21/2022   PTSD (post-traumatic stress disorder)  08/21/2022   Pain in left hip 10/11/2020   Depression, major, single episode, mild 05/04/2018   Insomnia 05/04/2018   Migraine 07/16/2017   Joint stiffness 11/09/2015   Herpes simplex of female genitalia 08/16/2014   Essential hypertension 07/12/2014   Sickle cell trait 07/12/2014    [name], OT 08/27/2022, 9:32 PM [name], OT  [facility] HOSPITALIZATION PROGRAM [address] [place], [place], [address] Phone: [phone]   Fax:  [phone]  Name: Shirley Nunez MRN: [id number] Date of Birth: 07-16-1975

## 2022-08-27 NOTE — Therapy (Signed)
[facility] PARTIAL HOSPITALIZATION PROGRAM [address] [place], [place], [address] Phone: [phone]   Fax:  [phone]  Occupational Therapy Evaluation Virtual Visit via Video Note  I connected with Shirley Nunez on 08/27/22 at  8:00 AM EDT by a video enabled telemedicine application and verified that I am speaking with the correct person using two identifiers.  Location: Patient: home Provider: office   I discussed the limitations of evaluation and management by telemedicine and the availability of in person appointments. The patient expressed understanding and agreed to proceed.    The patient was advised to call back or seek an in-person evaluation if the symptoms worsen or if the condition fails to improve as anticipated.  I provided 85 minutes of non-face-to-face time during this encounter.   Patient Details  Name: Shirley Nunez MRN: [id number] Date of Birth: [birth year] No data recorded  Encounter Date: 08/26/2022   OT End of Session - 08/27/22 2057     Visit Number 1    Number of Visits 20    Date for OT Re-Evaluation 09/26/22    OT Start Time 1000    OT Stop Time 1255   eval: 30; Tx: 55; Total: 85 minutes   OT Time Calculation (min) 175 min    Activity Tolerance Patient tolerated treatment well             Past Medical History:  Diagnosis Date   Anxiety    Depression    HSV infection    on daily medication   Migraine    Sickle cell anemia    Sickle cell trait    Past Surgical History:  Procedure Laterality Date   ENDOMETRIAL ABLATION     WISDOM TOOTH EXTRACTION      There were no vitals filed for this visit.   Subjective Assessment - 08/27/22 2054     Subjective  I have suppresed a lot for a long time and and now that I am beginning to work on these things it's quite overwhelming    Pertinent History MDD, PTSD    Limitations social / communications    Patient Stated Goals improve psychosocial domains that are  inhibited and interfer occupational performance    Currently in Pain? No/denies    Pain Score 0-No pain    Multiple Pain Sites No                OT Assessment  Diagnosis: MDD  Past medical history/referral information: PTSD Living situation: alone / daugher lives w/ pt every other week ADLs: independent  Work: Education officer, environmental Leisure: inhibited Social support:  fair Struggles: coping / professional / routines / habits / goals / leisure  OT goal:  improve psychosocial domains that inhibit occupational performance   OCAIRS Mental Health Interview Summary of Client Scores:  Facilitates participation in occupation Allows participation in occupation Inhibits participation in occupation Restricts participation in occupation Comments:  Roles   X    Habits   X    Personal Causation    X   Values  X     Interests   X    Skills   X    Short-Term Goals   X    Long-term Goals  X     Interpretation of Past Experiences   X    Physical Environment  X     Social Environment   X    Readiness for Change   X  Need for Occupational Therapy:  4 Shows positive occupational participation, no need for OT.   3 Need for minimal intervention/consultative participation   2 Need for OT intervention indicated to restore/improve participation   1 Need for extensive OT intervention indicated to improve participation.  Referral for follow up services also recommended.   Assessment:  Patient demonstrates behavior that INHIBITS participation in occupation.  Patient will benefit from occupational therapy intervention in order to improve time management, financial management, stress management, job readiness skills, social skills, and health management skills in preparation to return to full time community living and to be a productive community member.    Plan:  Patient will participate in skilled occupational therapy sessions individually or in a group setting to improve coping skills, psychosocial skills,  and emotional skills required to return to prior level of function. Treatment will be 4-5 times per week for 4 weeks.    Group Session:  O: During the group therapy session, the occupational therapist discussed the impact of sleep disturbances on daily activities and overall health and wellbeing.   The OT also reviewed various types of sleep disorders, including insomnia, sleep apnea, restless leg syndrome, and narcolepsy, and their associated symptoms. Strategies for managing and treating sleep disturbances were also discussed, such as establishing a consistent sleep routine, avoiding stimulants before bedtime, and engaging in relaxation techniques.   Today's group also included information on how sleep disturbances can cause fatigue, mood changes, cognitive impairment, and physical health problems, and emphasizes the importance of seeking prompt treatment to maintain overall health and wellbeing.   A: In today's session, the patient demonstrated active engagement with the topic of The Importance of Sleep. They eagerly asked questions, contributed personal experiences, and showcased a noticeable eagerness to apply the discussed principles. Their participation indicated not only a strong understanding of the subject matter but also an intrinsic motivation to implement better sleep practices in their daily routine. Based on their proactive involvement, it is assessed that the patient greatly benefited from today's treatment and will likely make efforts to incorporate the insights gained.                    OT Education - 08/27/22 2056     Education Details OCAIRS / OT Tx    Person(s) Educated Patient    Methods Handout;Explanation    Comprehension Verbalized understanding              OT Short Term Goals - 08/27/22 2059       OT SHORT TERM GOAL #1   Title Client will independently seek out and participate in a community or online support group to reinforce coping  strategies by time of discharge.    Time 4    Period Weeks    Status On-going    Target Date 09/26/22      OT SHORT TERM GOAL #2   Title By discharge, client will demonstrate the ability to adapt and modify routines when faced with unexpected events or changes, maintaining a balanced approach to daily tasks.      OT SHORT TERM GOAL #3   Title By the time of discharge, client will independently set, track, and make progress towards a long-term goal, demonstrating resilience in overcoming obstacles and seeking support when needed.                      Plan - 08/27/22 2057     Clinical Impression Statement Pt presents  w/ deficits in multiple psychosocial domains to the extrent they inhibit occupational performance of daily activities / work and leisure.    OT Occupational Profile and History Problem Focused Assessment - Including review of records relating to presenting problem    Occupational performance deficits (Please refer to evaluation for details): IADL's;Work;Rest and Sleep;Leisure;Social Participation    Psychosocial Skills Coping Strategies;Habits;Interpersonal Interaction;Routines and Behaviors    Rehab Potential Good    Clinical Decision Making Limited treatment options, no task modification necessary    Comorbidities Affecting Occupational Performance: None    Modification or Assistance to Complete Evaluation  No modification of tasks or assist necessary to complete eval    OT Frequency 5x / week    OT Duration 4 weeks    OT Treatment/Interventions Psychosocial skills training;Coping strategies training             Patient will benefit from skilled therapeutic intervention in order to improve the following deficits and impairments:       Psychosocial Skills: Coping Strategies, Habits, Interpersonal Interaction, Routines and Behaviors   Visit Diagnosis: Difficulty coping  Severe episode of recurrent major depressive disorder, without psychotic  features  PTSD (post-traumatic stress disorder)    Problem List Patient Active Problem List   Diagnosis Date Noted   Major depressive disorder, recurrent episode, severe 08/21/2022   PTSD (post-traumatic stress disorder) 08/21/2022   Pain in left hip 10/11/2020   Depression, major, single episode, mild 05/04/2018   Insomnia 05/04/2018   Migraine 07/16/2017   Joint stiffness 11/09/2015   Herpes simplex of female genitalia 08/16/2014   Essential hypertension 07/12/2014   Sickle cell trait 07/12/2014    [name], OT 08/27/2022, 9:01 PM  [name], OT   [facility] HOSPITALIZATION PROGRAM [address] [place], [place], [address] Phone: [phone]   Fax:  [phone]  Name: Shirley Nunez MRN: [id number] Date of Birth: [birth year]

## 2022-08-28 ENCOUNTER — Encounter (HOSPITAL_COMMUNITY): Payer: Self-pay

## 2022-08-28 ENCOUNTER — Other Ambulatory Visit (HOSPITAL_COMMUNITY): Payer: BC Managed Care – PPO | Attending: Psychiatry

## 2022-08-28 ENCOUNTER — Other Ambulatory Visit (HOSPITAL_COMMUNITY): Payer: BC Managed Care – PPO | Attending: Psychiatry | Admitting: Licensed Clinical Social Worker

## 2022-08-28 DIAGNOSIS — F332 Major depressive disorder, recurrent severe without psychotic features: Secondary | ICD-10-CM | POA: Diagnosis not present

## 2022-08-28 DIAGNOSIS — F4310 Post-traumatic stress disorder, unspecified: Secondary | ICD-10-CM | POA: Insufficient documentation

## 2022-08-28 DIAGNOSIS — R4589 Other symptoms and signs involving emotional state: Secondary | ICD-10-CM

## 2022-08-28 NOTE — Therapy (Signed)
[facility] PARTIAL HOSPITALIZATION PROGRAM [address] [place], [place], [address] Phone: [phone]   Fax:  [phone]  Occupational Therapy Treatment Virtual Visit via Video Note  I connected with Shirley Nunez on 08/28/22 at  8:00 AM EDT by a video enabled telemedicine application and verified that I am speaking with the correct person using two identifiers.  Location: Patient: home Provider: office   I discussed the limitations of evaluation and management by telemedicine and the availability of in person appointments. The patient expressed understanding and agreed to proceed.    The patient was advised to call back or seek an in-person evaluation if the symptoms worsen or if the condition fails to improve as anticipated.  I provided 30 minutes of non-face-to-face time during this encounter.   Patient Details  Name: Shirley Nunez MRN: [id number] Date of Birth: 06/04/1975 No data recorded  Encounter Date: 08/28/2022   OT End of Session - 08/28/22 1925     Visit Number 3    Number of Visits 20    Date for OT Re-Evaluation 09/26/22    OT Start Time 1200    OT Stop Time 1230    OT Time Calculation (min) 30 min    Activity Tolerance Patient tolerated treatment well             Past Medical History:  Diagnosis Date   Anxiety    Depression    HSV infection    on daily medication   Migraine    Sickle cell anemia    Sickle cell trait    Past Surgical History:  Procedure Laterality Date   ENDOMETRIAL ABLATION     WISDOM TOOTH EXTRACTION      There were no vitals filed for this visit.   Subjective Assessment - 08/28/22 1923     Currently in Pain? No/denies    Pain Score 0-No pain               Group Session:  S: Better today overall.  O: The objective of the telehealth group therapy session was to discuss the significance of routines in promoting mental health and wellbeing. The OT aimed to explore the power of  routines in providing structure, stability, and predictability in individuals' lives, as well as their role in establishing healthy habits, reducing stress and anxiety, managing time effectively, achieving goals, and fostering a sense of community and social connectedness.   Participants were guided to identify areas where routines could improve their mental health and were provided with tips for establishing and maintaining healthy routines. The session concluded by emphasizing the potential of routines to enhance overall quality of life.  Homework Assignment:  As part of the session, participants were assigned a homework task to reflect on their current routines and select one area of their lives where they could establish a new routine to improve their mental health and wellbeing. They were instructed to start small and implement the routine gradually, while seeking support from friends, family, or mental health professionals as needed. The participants were asked to report their progress in the next therapy session, focusing on the benefits and challenges encountered during the implementation of their chosen routine.   A: During the telehealth group therapy session, the patient actively participated in the discussion on the importance of routines in promoting mental health and wellbeing. The patient demonstrated a good understanding of the power of routines in providing structure, stability, and predictability in their life. They  were able to identify areas in their life where routines could contribute to improving their overall mental health.  The patient showed motivation and willingness to reflect on their current habits and behaviors, recognizing the need for positive changes. They actively engaged in the session, sharing personal experiences and challenges related to establishing and maintaining healthy routines. The patient expressed a desire to reduce stress and anxiety, manage their time more  effectively, and achieve their goals through the implementation of routines.  The patient actively participated in the discussion of tips for establishing and maintaining healthy routines. They demonstrated an understanding of the importance of starting small, being consistent, and gradually increasing the complexity of their routines. The patient expressed a willingness to remain flexible and adaptable, acknowledging that adjustments might be necessary as circumstances and priorities change over time.  The patient's active participation, understanding of the concepts discussed, and their motivation to implement positive changes in their life, it can be assessed that they have a good potential to benefit from incorporating healthy routines into their daily life. The patient's commitment to reflecting on their current routines and selecting an area for improvement indicates a proactive approach to their mental health and wellbeing. Continued support and guidance in implementing and maintaining the identified routine will be beneficial for the patient's overall progress.   P: Continue to attend PHP OT group sessions 5x week for 4 weeks to promote daily structure, social engagement, and opportunities to develop and utilize adaptive strategies to maximize functional performance in preparation for safe transition and integration back into school, work, and the community. Plan to address topic of routines in next OT group session.                    OT Education - 08/28/22 1923     Education Details Routines              OT Short Term Goals - 08/27/22 2059       OT SHORT TERM GOAL #1   Title Client will independently seek out and participate in a community or online support group to reinforce coping strategies by time of discharge.    Time 4    Period Weeks    Status On-going    Target Date 09/26/22      OT SHORT TERM GOAL #2   Title By discharge, client will demonstrate the  ability to adapt and modify routines when faced with unexpected events or changes, maintaining a balanced approach to daily tasks.      OT SHORT TERM GOAL #3   Title By the time of discharge, client will independently set, track, and make progress towards a long-term goal, demonstrating resilience in overcoming obstacles and seeking support when needed.                      Plan - 08/28/22 1927     Psychosocial Skills Coping Strategies;Habits;Interpersonal Interaction;Routines and Behaviors             Patient will benefit from skilled therapeutic intervention in order to improve the following deficits and impairments:       Psychosocial Skills: Coping Strategies, Habits, Interpersonal Interaction, Routines and Behaviors   Visit Diagnosis: Difficulty coping    Problem List Patient Active Problem List   Diagnosis Date Noted   Major depressive disorder, recurrent episode, severe 08/21/2022   PTSD (post-traumatic stress disorder) 08/21/2022   Pain in left hip 10/11/2020   Depression, major, single episode,  mild 05/04/2018   Insomnia 05/04/2018   Migraine 07/16/2017   Joint stiffness 11/09/2015   Herpes simplex of female genitalia 08/16/2014   Essential hypertension 07/12/2014   Sickle cell trait 07/12/2014    [name], OT 08/28/2022, 7:27 PM  [name], OT   [facility] PROGRAM [address] [place], [place], [address] Phone: [phone]   Fax:  [phone]  Name: Shirley Nunez MRN: [id number] Date of Birth: 1976/03/24

## 2022-08-29 ENCOUNTER — Encounter (HOSPITAL_COMMUNITY): Payer: Self-pay

## 2022-08-29 ENCOUNTER — Other Ambulatory Visit (HOSPITAL_COMMUNITY): Payer: BC Managed Care – PPO | Attending: Psychiatry | Admitting: Licensed Clinical Social Worker

## 2022-08-29 ENCOUNTER — Other Ambulatory Visit (HOSPITAL_COMMUNITY): Payer: BC Managed Care – PPO | Attending: Psychiatry

## 2022-08-29 DIAGNOSIS — F332 Major depressive disorder, recurrent severe without psychotic features: Secondary | ICD-10-CM | POA: Diagnosis not present

## 2022-08-29 DIAGNOSIS — Z9151 Personal history of suicidal behavior: Secondary | ICD-10-CM | POA: Insufficient documentation

## 2022-08-29 DIAGNOSIS — Z5986 Financial insecurity: Secondary | ICD-10-CM | POA: Diagnosis not present

## 2022-08-29 DIAGNOSIS — Z5941 Food insecurity: Secondary | ICD-10-CM | POA: Diagnosis not present

## 2022-08-29 DIAGNOSIS — F4310 Post-traumatic stress disorder, unspecified: Secondary | ICD-10-CM

## 2022-08-29 DIAGNOSIS — F411 Generalized anxiety disorder: Secondary | ICD-10-CM | POA: Insufficient documentation

## 2022-08-29 DIAGNOSIS — Z79899 Other long term (current) drug therapy: Secondary | ICD-10-CM | POA: Insufficient documentation

## 2022-08-29 DIAGNOSIS — F410 Panic disorder [episodic paroxysmal anxiety] without agoraphobia: Secondary | ICD-10-CM | POA: Insufficient documentation

## 2022-08-29 DIAGNOSIS — R4589 Other symptoms and signs involving emotional state: Secondary | ICD-10-CM
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
## 2022-09-01 ENCOUNTER — Other Ambulatory Visit (HOSPITAL_COMMUNITY): Payer: BC Managed Care – PPO | Attending: Psychiatry | Admitting: Licensed Clinical Social Worker

## 2022-09-01 ENCOUNTER — Other Ambulatory Visit (HOSPITAL_COMMUNITY): Payer: BC Managed Care – PPO | Attending: Psychiatry

## 2022-09-01 ENCOUNTER — Encounter (HOSPITAL_COMMUNITY): Payer: Self-pay

## 2022-09-01 DIAGNOSIS — F332 Major depressive disorder, recurrent severe without psychotic features: Secondary | ICD-10-CM | POA: Diagnosis not present

## 2022-09-01 DIAGNOSIS — F4310 Post-traumatic stress disorder, unspecified: Secondary | ICD-10-CM | POA: Insufficient documentation

## 2022-09-01 DIAGNOSIS — R4589 Other symptoms and signs involving emotional state: Secondary | ICD-10-CM | POA: Insufficient documentation

## 2022-09-01 NOTE — Therapy (Signed)
[facility] PARTIAL HOSPITALIZATION PROGRAM [address] [place], [place], [address] Phone: [phone]   Fax:  [phone]  Occupational Therapy Treatment Virtual Visit via Video Note  I connected with Shirley Nunez on 09/01/22 at  8:00 AM EDT by a video enabled telemedicine application and verified that I am speaking with the correct person using two identifiers.  Location: Patient: home Provider: office   I discussed the limitations of evaluation and management by telemedicine and the availability of in person appointments. The patient expressed understanding and agreed to proceed.    The patient was advised to call back or seek an in-person evaluation if the symptoms worsen or if the condition fails to improve as anticipated.  I provided 55 minutes of non-face-to-face time during this encounter.   Patient Details  Name: Shirley Nunez MRN: [id number] Date of Birth: 1975/08/15 No data recorded  Encounter Date: 08/29/2022   OT End of Session - 09/01/22 0908     Visit Number 4    Number of Visits 20    Date for OT Re-Evaluation 09/26/22    OT Start Time 1200    OT Stop Time 1255    OT Time Calculation (min) 55 min    Activity Tolerance Patient tolerated treatment well             Past Medical History:  Diagnosis Date   Anxiety    Depression    HSV infection    on daily medication   Migraine    Sickle cell anemia    Sickle cell trait    Past Surgical History:  Procedure Laterality Date   ENDOMETRIAL ABLATION     WISDOM TOOTH EXTRACTION      There were no vitals filed for this visit.   Subjective Assessment - 09/01/22 0907     Currently in Pain? No/denies    Pain Score 0-No pain                  Group Session:  S: Feeling better today I think.   O: The objective of the telehealth group therapy session was to discuss the significance of routines in promoting mental health and wellbeing. The OT aimed to explore  the power of routines in providing structure, stability, and predictability in individuals' lives, as well as their role in establishing healthy habits, reducing stress and anxiety, managing time effectively, achieving goals, and fostering a sense of community and social connectedness.   Participants were guided to identify areas where routines could improve their mental health and were provided with tips for establishing and maintaining healthy routines. The session concluded by emphasizing the potential of routines to enhance overall quality of life.  Homework Assignment:  As part of the session, participants were assigned a homework task to reflect on their current routines and select one area of their lives where they could establish a new routine to improve their mental health and wellbeing. They were instructed to start small and implement the routine gradually, while seeking support from friends, family, or mental health professionals as needed. The participants were asked to report their progress in the next therapy session, focusing on the benefits and challenges encountered during the implementation of their chosen routine.   A: During the telehealth group therapy session, the patient actively participated in the discussion on the importance of routines in promoting mental health and wellbeing. The patient demonstrated a good understanding of the power of routines in providing structure, stability,  and predictability in their life. They were able to identify areas in their life where routines could contribute to improving their overall mental health.  The patient showed motivation and willingness to reflect on their current habits and behaviors, recognizing the need for positive changes. They actively engaged in the session, sharing personal experiences and challenges related to establishing and maintaining healthy routines. The patient expressed a desire to reduce stress and anxiety, manage  their time more effectively, and achieve their goals through the implementation of routines.  The patient actively participated in the discussion of tips for establishing and maintaining healthy routines. They demonstrated an understanding of the importance of starting small, being consistent, and gradually increasing the complexity of their routines. The patient expressed a willingness to remain flexible and adaptable, acknowledging that adjustments might be necessary as circumstances and priorities change over time.  The patient's active participation, understanding of the concepts discussed, and their motivation to implement positive changes in their life, it can be assessed that they have a good potential to benefit from incorporating healthy routines into their daily life. The patient's commitment to reflecting on their current routines and selecting an area for improvement indicates a proactive approach to their mental health and wellbeing. Continued support and guidance in implementing and maintaining the identified routine will be beneficial for the patient's overall progress.   P: Continue to attend PHP OT group sessions 5x week for 4 weeks to promote daily structure, social engagement, and opportunities to develop and utilize adaptive strategies to maximize functional performance in preparation for safe transition and integration back into school, work, and the community. Plan to address topic of 4 in next OT group session.                 OT Education - 09/01/22 0907     Education Details Routines              OT Short Term Goals - 08/27/22 2059       OT SHORT TERM GOAL #1   Title Client will independently seek out and participate in a community or online support group to reinforce coping strategies by time of discharge.    Time 4    Period Weeks    Status On-going    Target Date 09/26/22      OT SHORT TERM GOAL #2   Title By discharge, client will demonstrate  the ability to adapt and modify routines when faced with unexpected events or changes, maintaining a balanced approach to daily tasks.      OT SHORT TERM GOAL #3   Title By the time of discharge, client will independently set, track, and make progress towards a long-term goal, demonstrating resilience in overcoming obstacles and seeking support when needed.                      Plan - 09/01/22 0908     Psychosocial Skills Coping Strategies;Habits;Interpersonal Interaction;Routines and Behaviors             Patient will benefit from skilled therapeutic intervention in order to improve the following deficits and impairments:       Psychosocial Skills: Coping Strategies, Habits, Interpersonal Interaction, Routines and Behaviors   Visit Diagnosis: Difficulty coping    Problem List Patient Active Problem List   Diagnosis Date Noted   Major depressive disorder, recurrent episode, severe 08/21/2022   PTSD (post-traumatic stress disorder) 08/21/2022   Pain in left hip 10/11/2020   Depression,  major, single episode, mild 05/04/2018   Insomnia 05/04/2018   Migraine 07/16/2017   Joint stiffness 11/09/2015   Herpes simplex of female genitalia 08/16/2014   Essential hypertension 07/12/2014   Sickle cell trait 07/12/2014    [name], OT 09/01/2022, 9:08 AM  [name], OT   [facility] PROGRAM [address] [place], [place], [address] Phone: [phone]   Fax:  [phone]  Name: Shirley Nunez MRN: [id number] Date of Birth: [birth year]

## 2022-09-02 ENCOUNTER — Other Ambulatory Visit (HOSPITAL_COMMUNITY): Payer: BC Managed Care – PPO | Attending: Psychiatry

## 2022-09-02 ENCOUNTER — Encounter (HOSPITAL_COMMUNITY): Payer: Self-pay

## 2022-09-02 ENCOUNTER — Other Ambulatory Visit (HOSPITAL_COMMUNITY): Payer: BC Managed Care – PPO | Attending: Psychiatry | Admitting: Licensed Clinical Social Worker

## 2022-09-02 DIAGNOSIS — F332 Major depressive disorder, recurrent severe without psychotic features: Secondary | ICD-10-CM

## 2022-09-02 DIAGNOSIS — Z79899 Other long term (current) drug therapy: Secondary | ICD-10-CM | POA: Insufficient documentation

## 2022-09-02 DIAGNOSIS — F4310 Post-traumatic stress disorder, unspecified: Secondary | ICD-10-CM | POA: Insufficient documentation

## 2022-09-02 DIAGNOSIS — F411 Generalized anxiety disorder: Secondary | ICD-10-CM | POA: Insufficient documentation

## 2022-09-02 DIAGNOSIS — F339 Major depressive disorder, recurrent, unspecified: Secondary | ICD-10-CM | POA: Diagnosis not present

## 2022-09-02 DIAGNOSIS — R4589 Other symptoms and signs involving emotional state: Secondary | ICD-10-CM | POA: Diagnosis not present

## 2022-09-02 MED ORDER — SERTRALINE HCL 25 MG PO TABS: 25.0000 mg | ORAL_TABLET | Freq: Every day | ORAL | 0 refills | Status: DC

## 2022-09-02 NOTE — Progress Notes (Signed)
Virtual Visit via Video Note  I connected with Shirley Nunez on 09/02/22 at  9:00 AM EDT by a video enabled telemedicine application and verified that I am speaking with the correct person using two identifiers.  Location: Patient: Home Provider: Office   I discussed the limitations of evaluation and management by telemedicine and the availability of in person appointments. The patient expressed understanding and agreed to proceed.    I discussed the assessment and treatment plan with the patient. The patient was provided an opportunity to ask questions and all were answered. The patient agreed with the plan and demonstrated an understanding of the instructions.   The patient was advised to call back or seek an in-person evaluation if the symptoms worsen or if the condition fails to improve as anticipated.     [name], MD   [facility] MD/PA/NP OP Progress Note  09/02/2022 2:30 PM [name]  MRN:  [id number]  Chief Complaint:  Chief Complaint  Patient presents with   Follow-up   HPI: Shirley Nunez is a 47 yo patient with a PPH of PTSD, GAD, and MDD.   Current medication: - Zoloft  daily - Klonopin  QHS  Patient reports that she is "ok" but yesterday was not good. She reports that the night before yesterday she got 3 h of sleep. Patient reports that she has a lot of anxiety with her medications due to changing. She reports that she is not sweating on Zoloft. She is practicing not focusing on negative. She does endorse some low appetite.   She reports that she takes the Zoloft in the AM. She cannot say it is sedating for her and thinks that she is ok taking the Zoloft in the AM. She reports she has been trying the Hydroxyzine 25-50mg  and found  a bit sedating but is too nervous to change the dose and feels like she needs to give her medications more time.   Patient reports that she has her daughter this week and would like to see how she does on Zoloft  for the  week when she has activities. She wants to make sure she is not apathetic on this medication. She would like to reevaluate next week if her anxiety is bad.   Patient reports that she is still working on her GAD. She reports that she still has some anhedonia if her daughter is not around. She can still feel on edge when in large public spaces or busy places that can make her feel overwhelmed. She endorses still feeling hypervigilant. She continues to endorse hyperarousal to loud noises, and will leave certain situations or become very irritable. She will also have shakiness after events that lead to hyperarousal. She saw her ex at the gas station and she peed her self a bit, this happened last night. She heard him speak and stopped filling her car and left. While telling this story she starts to sweat. She was only after sleep after taking  Hydroxyzine last night.   She reports feeling anger, shocked, and embarrassed about her response and seeing him.   She reports that yesterday she vomited likely due to anxiety after talking about traumas in the work place.   Patient denies SI, HI, and AVH.  Patient plans to have a deeper conversations with her kids about her mental health.   - Refill Zoloft at  daily  Visit Diagnosis:    ICD-10-CM   1. PTSD (post-traumatic stress disorder)  F43.10 sertraline (ZOLOFT)  25 MG tablet    2. Severe episode of recurrent major depressive disorder, without psychotic features  F33.2 sertraline (ZOLOFT) 25 MG tablet      Past Psychiatric History: INPT: 10/08/1999 after son died, SA of Xanax went to [employer] OPT: Current Dr. Bronwen Betters 1x, not in the past Therapy: Melanie Paschal Hx meds: Celexa 10mg  (apathy), Klonopin 1mg  daily, Prazosin (makes her talk really slow others noticed, did not sleep any better), Lexapro (extreme diaphoresis and headaches, really tried to be compliant) Valium, Xanax    Past Medical History:  Past Medical History:  Diagnosis Date    Anxiety    Depression    HSV infection    on daily medication   Migraine    Sickle cell anemia    Sickle cell trait    Past Surgical History:  Procedure Laterality Date   ENDOMETRIAL ABLATION     WISDOM TOOTH EXTRACTION      Family Psychiatric History:   Father: Opioid use d/o A lot of etoh use d/o P Aunt: Bipolar d/o Other P Aunt: dementia first signs were in her 13s  Family History:  Family History  Problem Relation Age of Onset   Hypertension Mother    Depression Mother    Miscarriages / Stillbirths Mother    Hypertension Father    Hyperlipidemia Father    Diabetes Father    Arthritis Father    Stroke Father    Sickle cell anemia Sister    Alcohol abuse Maternal Grandmother    Arthritis Maternal Grandmother    Depression Maternal Grandmother    Diabetes Maternal Grandmother    Heart attack Maternal Grandmother    Hypertension Maternal Grandmother    Stroke Maternal Grandmother    Heart attack Maternal Grandfather    Lung cancer Paternal Grandmother        mets to brain   Alcohol abuse Paternal Grandfather    Heart attack Paternal Grandfather    Hypertension Paternal Grandfather    Stroke Paternal Grandfather    Colon cancer Neg Hx     Social History:  Social History   Socioeconomic History   Marital status: Single    Spouse name: Not on file   Number of children: 2   Years of education: Not on file   Highest education level: Associate degree: occupational, Scientist, product/process development, or vocational program  Occupational History   Not on file  Tobacco Use   Smoking status: Never   Smokeless tobacco: Never  Vaping Use   Vaping Use: Never used  Substance and Sexual Activity   Alcohol use: Yes    Alcohol/week: 2.0 standard drinks of alcohol    Types: 2 Glasses of wine per week    Comment: 2 x / month   Drug use: No   Sexual activity: Yes    Birth control/protection: Other-see comments, None    Comment: ablation  Other Topics Concern   Not on file  Social  History Narrative   Sales promotion account executive at [employer]    Married   2 kids   Likes: writing and reading   One of her sons passed away 16 years ago   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, 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   Social Determinants of Health   Financial Resource Strain: Medium Risk (07/31/2022)   Overall Financial Resource Strain (CARDIA)    Difficulty of Paying Living Expenses: Somewhat hard  Food Insecurity: Food Insecurity Present (07/31/2022)   Hunger Vital Sign    Worried  About Running Out of Food in the Last Year: Often true    Ran Out of Food in the Last Year: Sometimes true  Transportation Needs: No Transportation Needs (07/31/2022)   PRAPARE - Administrator, Civil Service (Medical): No    Lack of Transportation (Non-Medical): No  Physical Activity: Insufficiently Active (07/31/2022)   Exercise Vital Sign    Days of Exercise per Week: 3 days    Minutes of Exercise per Session: 30 min  Stress: Stress Concern Present (07/31/2022)   [employer] of Occupational Health - Occupational Stress Questionnaire    Feeling of Stress : Very much  Social Connections: Socially Isolated (07/31/2022)   Social Connection and Isolation Panel [NHANES]    Frequency of Communication with Friends and Family: Once a week    Frequency of Social Gatherings with Friends and Family: Never    Attends Religious Services: 1 to 4 times per year    Active Member of [employer] or Organizations: No    Attends Engineer, structural: Not on file    Marital Status: Divorced    Allergies:  Allergies  Allergen Reactions   Mushroom Extract Complex Rash   Penicillins Rash    Metabolic Disorder Labs: No results found for: "HGBA1C", "MPG" No results found for: "PROLACTIN" Lab Results  Component Value Date   CHOL 196 07/09/2021   TRIG 40.0 07/09/2021   HDL 83.00 07/09/2021   CHOLHDL 2 07/09/2021   VLDL 8.0 07/09/2021   LDLCALC 105 (H) 07/09/2021   LDLCALC 94 09/14/2019   Lab  Results  Component Value Date   TSH 0.84 09/27/2020   TSH 1.00 12/17/2017    Therapeutic Level Labs: No results found for: "LITHIUM" No results found for: "VALPROATE" No results found for: "CBMZ"  Current Medications: Current Outpatient Medications  Medication Sig Dispense Refill   diazepam (VALIUM) 5 MG tablet Take 1 tablet (5 mg total) by mouth every 12 (twelve) hours as needed for anxiety. (Patient not taking: Reported on 08/29/2022) 30 tablet 1   hydrochlorothiazide (HYDRODIURIL) 25 MG tablet Take 1 tablet (25 mg total) by mouth daily as needed. 90 tablet 3   hydrOXYzine (ATARAX) 25 MG tablet Take 1 tablet (25 mg total) by mouth at bedtime and may repeat dose one time if needed. 30 tablet 0   prazosin (MINIPRESS) 1 MG capsule Take 1 mg by mouth at bedtime. (Patient not taking: Reported on 07/31/2022)     sertraline (ZOLOFT) 25 MG tablet Take 1 tablet (25 mg total) by mouth daily. 14 tablet 0   valACYclovir (VALTREX) 500 MG tablet Take 1 tablet (500 mg total) by mouth daily. 90 tablet 2   No current facility-administered medications for this visit.     Psychiatric Specialty Exam: Review of Systems  Psychiatric/Behavioral:  Positive for dysphoric mood and sleep disturbance. Negative for hallucinations and suicidal ideas.     There were no vitals taken for this visit.There is no height or weight on file to calculate BMI.  General Appearance: Casual  Eye Contact:  Good  Speech:  Clear and Coherent  Volume:  Normal  Mood:  Anxious  Affect:  Congruent and Tearful  Thought Process:  Coherent  Orientation:  Full (Time, Place, and Person)  Thought Content: Logical   Suicidal Thoughts:  No  Homicidal Thoughts:  No  Memory:  Immediate;   Good Recent;   Good  Judgement:  Fair  Insight:  Fair  Psychomotor Activity:  Psychomotor Retardation  Concentration:  Concentration:  Fair  Recall:  NA  Fund of Knowledge: Good  Language: Good  Akathisia:  NA  Handed:    AIMS (if  indicated): not done  Assets:  Communication Skills Desire for Improvement Housing Resilience Social Support Transportation  ADL's:  Intact  Cognition: WNL  Sleep:  Poor   Screenings: GAD-7    Flowsheet Row Office Visit from 07/31/2022 in [facility] from 06/11/2022 in [facility] from 11/19/2021 in [facility] from 07/09/2021 in [facility] from 03/27/2021 in [facility]  Total GAD-7 Score 20 18 11 17 5       PHQ2-9    Flowsheet Row Counselor from 08/29/2022 [facility] Counselor from 08/21/2022 in [facility] Office Visit from 07/31/2022 in [facility] from 07/07/2022 in [facility] from 06/11/2022 in [facility]  PHQ-2 Total Score 6 5 6 5 5   PHQ-9 Total Score 24 21 22 21 21       Flowsheet Row Counselor from 08/29/2022 in [facility] Counselor from 08/21/2022 in [facility] ED from 11/14/2021 in [facility] Emergency Department at [facility]  C-SSRS RISK CATEGORY No Risk Low Risk No Risk        Assessment and Plan: Patient continues to struggle with PTSD related symptoms such as hyperarousal and hypervigilance as well as accepting her feelings however, patient is working to process her feelings.  Patient is very anxious about medication adjustments and would like to continue on current dosages.  She did endorse that she has not been sleeping well at night but is sleeping more during the day.  At this time do not think it is medication related as patient is not sleeping well at night, and this appears more related to her anxiety.  Did talk to patient about Zoloft  sometimes making people tired however at the time of assessment patient denied feeling that this was the reason and again endorsed that she had not been sleeping well at night at times.  GAD PTSD Panic d/o MDD, recurrent, severe - Continue Zoloft 25 mg daily - Continue home Klonopin 1mg  QHS daily    Collaboration of Care: Collaboration of Care: PHP  Patient/Guardian was advised Release of Information must be obtained prior to any record release in order to collaborate their care with an outside provider. Patient/Guardian was advised if they have not already done so to contact the registration department to sign all necessary forms in order for [place] to release information regarding their care.   Consent: Patient/Guardian gives verbal consent for treatment and assignment of benefits for services provided during this visit. Patient/Guardian expressed understanding and agreed to proceed.   PGY-3 [name], MD 09/02/2022, 2:30 PM

## 2022-09-02 NOTE — Therapy (Signed)
[facility] PARTIAL HOSPITALIZATION PROGRAM [address] [place], [place], [address] Phone: [phone]   Fax:  [phone]  Occupational Therapy Treatment Virtual Visit via Video Note  I connected with Shirley Nunez on 09/02/22 at  8:00 AM EDT by a video enabled telemedicine application and verified that I am speaking with the correct person using two identifiers.  Location: Patient: home Provider: office   I discussed the limitations of evaluation and management by telemedicine and the availability of in person appointments. The patient expressed understanding and agreed to proceed.    The patient was advised to call back or seek an in-person evaluation if the symptoms worsen or if the condition fails to improve as anticipated.  I provided 55 minutes of non-face-to-face time during this encounter.   Patient Details  Name: Shirley Nunez MRN: [id number] Date of Birth: Apr 16, 1976 No data recorded  Encounter Date: 09/01/2022   OT End of Session - 09/02/22 1803     Visit Number 5    Number of Visits 20    Date for OT Re-Evaluation 09/26/22    OT Start Time 1200    OT Stop Time 1255    OT Time Calculation (min) 55 min             Past Medical History:  Diagnosis Date   Anxiety    Depression    HSV infection    on daily medication   Migraine    Sickle cell anemia    Sickle cell trait    Past Surgical History:  Procedure Laterality Date   ENDOMETRIAL ABLATION     WISDOM TOOTH EXTRACTION      There were no vitals filed for this visit.   Subjective Assessment - 09/02/22 1802     Currently in Pain? No/denies    Pain Score 0-No pain                Group Session:  S: Doing better today  O: The objective of the group session is to explore and redefine the concept of productivity, emphasizing the importance of balance between work, rest, and leisure to enhance overall well-being. The session aims to: Educate participants on  the broader definition of productivity that includes mental health and personal fulfillment alongside work-related achievements. Facilitate discussions around personal experiences related to the pressures of traditional productivity metrics. Introduce strategies for integrating leisure and rest into daily routines as essential components of productivity. Encourage reflection on personal values and redefine productivity to align with holistic health and life satisfaction. Participants will engage in guided discussions, reflective exercises, and goal-setting activities to develop a personalized understanding and application of this expanded productivity concept.   A: The patient actively participated in the group discussion, demonstrating a keen interest in redefining productivity to include wellness and leisure. The patient shared personal struggles with maintaining work-life balance and expressed a positive response to learning about the importance of integrating leisure for mental health. This engagement suggests a readiness to adopt new strategies for balancing daily activities, which may enhance overall well-being and reduce work-related stress.    P: Continue to attend PHP OT group sessions 5x week for 4 weeks to promote daily structure, social engagement, and opportunities to develop and utilize adaptive strategies to maximize functional performance in preparation for safe transition and integration back into school, work, and the community. Plan to address topic of pt 2 in next OT group session.  OT Education - 09/02/22 1803     Education Details Redefining Productivity              OT Short Term Goals - 08/27/22 2059       OT SHORT TERM GOAL #1   Title Client will independently seek out and participate in a community or online support group to reinforce coping strategies by time of discharge.    Time 4    Period Weeks    Status On-going    Target  Date 09/26/22      OT SHORT TERM GOAL #2   Title By discharge, client will demonstrate the ability to adapt and modify routines when faced with unexpected events or changes, maintaining a balanced approach to daily tasks.      OT SHORT TERM GOAL #3   Title By the time of discharge, client will independently set, track, and make progress towards a long-term goal, demonstrating resilience in overcoming obstacles and seeking support when needed.                      Plan - 09/02/22 1803     Psychosocial Skills Coping Strategies;Habits;Interpersonal Interaction;Routines and Behaviors             Patient will benefit from skilled therapeutic intervention in order to improve the following deficits and impairments:       Psychosocial Skills: Coping Strategies, Habits, Interpersonal Interaction, Routines and Behaviors   Visit Diagnosis: Difficulty coping    Problem List Patient Active Problem List   Diagnosis Date Noted   Major depressive disorder, recurrent episode, severe 08/21/2022   PTSD (post-traumatic stress disorder) 08/21/2022   Pain in left hip 10/11/2020   Depression, major, single episode, mild 05/04/2018   Insomnia 05/04/2018   Migraine 07/16/2017   Joint stiffness 11/09/2015   Herpes simplex of female genitalia 08/16/2014   Essential hypertension 07/12/2014   Sickle cell trait 07/12/2014    [name], OT 09/02/2022, 6:05 PM  [name], OT   [facility] HOSPITALIZATION PROGRAM [address] [place], [place], [address] Phone: [phone]   Fax:  [phone]  Name: Shirley Nunez MRN: [id number] Date of Birth: [birth year]

## 2022-09-03 ENCOUNTER — Encounter (HOSPITAL_COMMUNITY): Payer: Self-pay

## 2022-09-03 ENCOUNTER — Other Ambulatory Visit (HOSPITAL_COMMUNITY): Payer: BC Managed Care – PPO | Attending: Psychiatry | Admitting: Licensed Clinical Social Worker

## 2022-09-03 ENCOUNTER — Other Ambulatory Visit (HOSPITAL_COMMUNITY): Payer: BC Managed Care – PPO | Attending: Psychiatry

## 2022-09-03 DIAGNOSIS — F332 Major depressive disorder, recurrent severe without psychotic features: Secondary | ICD-10-CM

## 2022-09-03 DIAGNOSIS — F4310 Post-traumatic stress disorder, unspecified: Secondary | ICD-10-CM | POA: Diagnosis not present

## 2022-09-03 DIAGNOSIS — R4589 Other symptoms and signs involving emotional state: Secondary | ICD-10-CM | POA: Insufficient documentation

## 2022-09-03 NOTE — Therapy (Signed)
[facility] PARTIAL HOSPITALIZATION PROGRAM [address] [place], [place], [address] Phone: [phone]   Fax:  [phone]  Occupational Therapy Treatment Virtual Visit via Video Note  I connected with Niambi Smoak Feijoo on 09/03/22 at  8:00 AM EDT by a video enabled telemedicine application and verified that I am speaking with the correct person using two identifiers.  Location: Patient: home Provider: office   I discussed the limitations of evaluation and management by telemedicine and the availability of in person appointments. The patient expressed understanding and agreed to proceed.    The patient was advised to call back or seek an in-person evaluation if the symptoms worsen or if the condition fails to improve as anticipated.  I provided 55 minutes of non-face-to-face time during this encounter.  Patient Details  Name: Shirley Nunez MRN: [id number] Date of Birth: [birth year] No data recorded  Encounter Date: 09/02/2022   OT End of Session - 09/03/22 1813     Visit Number 5    Number of Visits 20    Date for OT Re-Evaluation 09/26/22    OT Start Time 1200    OT Stop Time 1255    OT Time Calculation (min) 55 min             Past Medical History:  Diagnosis Date   Anxiety    Depression    HSV infection    on daily medication   Migraine    Sickle cell anemia    Sickle cell trait    Past Surgical History:  Procedure Laterality Date   ENDOMETRIAL ABLATION     WISDOM TOOTH EXTRACTION      There were no vitals filed for this visit.   Subjective Assessment - 09/03/22 1813     Currently in Pain? No/denies    Pain Score 0-No pain                Group Session:  S: Doing well today  O: The objective of the group session is to explore and redefine the concept of productivity, emphasizing the importance of balance between work, rest, and leisure to enhance overall well-being. The session aims to: Educate participants on the  broader definition of productivity that includes mental health and personal fulfillment alongside work-related achievements. Facilitate discussions around personal experiences related to the pressures of traditional productivity metrics. Introduce strategies for integrating leisure and rest into daily routines as essential components of productivity. Encourage reflection on personal values and redefine productivity to align with holistic health and life satisfaction. Participants will engage in guided discussions, reflective exercises, and goal-setting activities to develop a personalized understanding and application of this expanded productivity concept.   A: The patient actively participated in the group discussion, demonstrating a keen interest in redefining productivity to include wellness and leisure. The patient shared personal struggles with maintaining work-life balance and expressed a positive response to learning about the importance of integrating leisure for mental health. This engagement suggests a readiness to adopt new strategies for balancing daily activities, which may enhance overall well-being and reduce work-related stress.     P: Continue to attend PHP OT group sessions 5x week for 4 weeks to promote daily structure, social engagement, and opportunities to develop and utilize adaptive strategies to maximize functional performance in preparation for safe transition and integration back into school, work, and the community. Plan to address topic of tbd in next OT group session.  OT Education - 09/03/22 1813     Education Details Redefining Productivity              OT Short Term Goals - 08/27/22 2059       OT SHORT TERM GOAL #1   Title Client will independently seek out and participate in a community or online support group to reinforce coping strategies by time of discharge.    Time 4    Period Weeks    Status On-going    Target  Date 09/26/22      OT SHORT TERM GOAL #2   Title By discharge, client will demonstrate the ability to adapt and modify routines when faced with unexpected events or changes, maintaining a balanced approach to daily tasks.      OT SHORT TERM GOAL #3   Title By the time of discharge, client will independently set, track, and make progress towards a long-term goal, demonstrating resilience in overcoming obstacles and seeking support when needed.                      Plan - 09/03/22 1813     Psychosocial Skills Coping Strategies;Habits;Interpersonal Interaction;Routines and Behaviors             Patient will benefit from skilled therapeutic intervention in order to improve the following deficits and impairments:       Psychosocial Skills: Coping Strategies, Habits, Interpersonal Interaction, Routines and Behaviors   Visit Diagnosis: Difficulty coping    Problem List Patient Active Problem List   Diagnosis Date Noted   Major depressive disorder, recurrent episode, severe 08/21/2022   PTSD (post-traumatic stress disorder) 08/21/2022   Pain in left hip 10/11/2020   Depression, major, single episode, mild 05/04/2018   Insomnia 05/04/2018   Migraine 07/16/2017   Joint stiffness 11/09/2015   Herpes simplex of female genitalia 08/16/2014   Essential hypertension 07/12/2014   Sickle cell trait 07/12/2014    [name], OT 09/03/2022, 6:14 PM  [name], OT   [facility] HOSPITALIZATION PROGRAM [address] [place], [place], [address] Phone: [phone]   Fax:  [phone]  Name: GHALIA REICKS MRN: [id number] Date of Birth: August 27, 1975

## 2022-09-03 NOTE — Therapy (Signed)
[facility] PARTIAL HOSPITALIZATION PROGRAM [address] [place], [place], [address] Phone: [phone]   Fax:  [phone]  Occupational Therapy Treatment Virtual Visit via Video Note  I connected with Haydan Wedig Pfahler on 09/03/22 at  8:00 AM EDT by a video enabled telemedicine application and verified that I am speaking with the correct person using two identifiers.  Location: Patient: home Provider: office   I discussed the limitations of evaluation and management by telemedicine and the availability of in person appointments. The patient expressed understanding and agreed to proceed.    The patient was advised to call back or seek an in-person evaluation if the symptoms worsen or if the condition fails to improve as anticipated.  I provided 55 minutes of non-face-to-face time during this encounter.   Patient Details  Name: Shirley Nunez MRN: [id number] Date of Birth: 17-Sep-1975 No data recorded  Encounter Date: 09/03/2022   OT End of Session - 09/03/22 1801     Visit Number 6    Number of Visits 20    Date for OT Re-Evaluation 09/26/22    OT Start Time 1200    OT Stop Time 1255    OT Time Calculation (min) 55 min             Past Medical History:  Diagnosis Date   Anxiety    Depression    HSV infection    on daily medication   Migraine    Sickle cell anemia    Sickle cell trait    Past Surgical History:  Procedure Laterality Date   ENDOMETRIAL ABLATION     WISDOM TOOTH EXTRACTION      There were no vitals filed for this visit.   Subjective Assessment - 09/03/22 1801     Currently in Pain? No/denies    Pain Score 0-No pain                Group Session:  S: Feeling better today.   O: The objective of the group session is to explore and redefine the concept of productivity, emphasizing the importance of balance between work, rest, and leisure to enhance overall well-being. The session aims to: Educate  participants on the broader definition of productivity that includes mental health and personal fulfillment alongside work-related achievements. Facilitate discussions around personal experiences related to the pressures of traditional productivity metrics. Introduce strategies for integrating leisure and rest into daily routines as essential components of productivity. Encourage reflection on personal values and redefine productivity to align with holistic health and life satisfaction. Participants will engage in guided discussions, reflective exercises, and goal-setting activities to develop a personalized understanding and application of this expanded productivity concept.   A: The patient actively participated in the group discussion, demonstrating a keen interest in redefining productivity to include wellness and leisure. The patient shared personal struggles with maintaining work-life balance and expressed a positive response to learning about the importance of integrating leisure for mental health. This engagement suggests a readiness to adopt new strategies for balancing daily activities, which may enhance overall well-being and reduce work-related stress.    P: Continue to attend PHP OT group sessions 5x week for 4 weeks to promote daily structure, social engagement, and opportunities to develop and utilize adaptive strategies to maximize functional performance in preparation for safe transition and integration back into school, work, and the community. Plan to address topic of tbd in next OT group session.  OT Education - 09/03/22 1801     Education Details Redefining Productivity              OT Short Term Goals - 08/27/22 2059       OT SHORT TERM GOAL #1   Title Client will independently seek out and participate in a community or online support group to reinforce coping strategies by time of discharge.    Time 4    Period Weeks    Status  On-going    Target Date 09/26/22      OT SHORT TERM GOAL #2   Title By discharge, client will demonstrate the ability to adapt and modify routines when faced with unexpected events or changes, maintaining a balanced approach to daily tasks.      OT SHORT TERM GOAL #3   Title By the time of discharge, client will independently set, track, and make progress towards a long-term goal, demonstrating resilience in overcoming obstacles and seeking support when needed.                      Plan - 09/03/22 1801     Psychosocial Skills Coping Strategies;Habits;Interpersonal Interaction;Routines and Behaviors             Patient will benefit from skilled therapeutic intervention in order to improve the following deficits and impairments:       Psychosocial Skills: Coping Strategies, Habits, Interpersonal Interaction, Routines and Behaviors   Visit Diagnosis: Difficulty coping    Problem List Patient Active Problem List   Diagnosis Date Noted   Major depressive disorder, recurrent episode, severe 08/21/2022   PTSD (post-traumatic stress disorder) 08/21/2022   Pain in left hip 10/11/2020   Depression, major, single episode, mild 05/04/2018   Insomnia 05/04/2018   Migraine 07/16/2017   Joint stiffness 11/09/2015   Herpes simplex of female genitalia 08/16/2014   Essential hypertension 07/12/2014   Sickle cell trait 07/12/2014    [name], OT 09/03/2022, 6:01 PM  [name], OT   [facility] HOSPITALIZATION PROGRAM [address] [place], [place], [address] Phone: [phone]   Fax:  [phone]  Name: AISLEY WHAN MRN: [id number] Date of Birth: Mar 26, 1976

## 2022-09-04 ENCOUNTER — Other Ambulatory Visit (HOSPITAL_COMMUNITY): Payer: BC Managed Care – PPO | Attending: Psychiatry | Admitting: Licensed Clinical Social Worker

## 2022-09-04 ENCOUNTER — Encounter (HOSPITAL_COMMUNITY): Payer: Self-pay

## 2022-09-04 ENCOUNTER — Other Ambulatory Visit (HOSPITAL_COMMUNITY): Payer: BC Managed Care – PPO | Attending: Psychiatry

## 2022-09-04 DIAGNOSIS — F4310 Post-traumatic stress disorder, unspecified: Secondary | ICD-10-CM | POA: Insufficient documentation

## 2022-09-04 DIAGNOSIS — F332 Major depressive disorder, recurrent severe without psychotic features: Secondary | ICD-10-CM | POA: Insufficient documentation

## 2022-09-04 DIAGNOSIS — R4589 Other symptoms and signs involving emotional state: Secondary | ICD-10-CM | POA: Diagnosis not present

## 2022-09-04 NOTE — Therapy (Signed)
[facility] PARTIAL HOSPITALIZATION PROGRAM [address] [place], [place], [address] Phone: [phone]   Fax:  [phone]  Occupational Therapy Treatment Virtual Visit via Video Note  I connected with Shirley Nunez on 09/04/22 at  8:00 AM EDT by a video enabled telemedicine application and verified that I am speaking with the correct person using two identifiers.  Location: Patient: home Provider: office   I discussed the limitations of evaluation and management by telemedicine and the availability of in person appointments. The patient expressed understanding and agreed to proceed.    The patient was advised to call back or seek an in-person evaluation if the symptoms worsen or if the condition fails to improve as anticipated.  I provided 55 minutes of non-face-to-face time during this encounter.   Patient Details  Name: Shirley Nunez MRN: [id number] Date of Birth: 1976-04-02 No data recorded  Encounter Date: 09/04/2022   OT End of Session - 09/04/22 2225     Visit Number 6    Number of Visits 20    Date for OT Re-Evaluation 09/26/22    OT Start Time 1200    OT Stop Time 1255    OT Time Calculation (min) 55 min             Past Medical History:  Diagnosis Date   Anxiety    Depression    HSV infection    on daily medication   Migraine    Sickle cell anemia (HCC)    Sickle cell trait    Past Surgical History:  Procedure Laterality Date   ENDOMETRIAL ABLATION     WISDOM TOOTH EXTRACTION      There were no vitals filed for this visit.   Subjective Assessment - 09/04/22 2225     Pain Score 0-No pain                  Group Session:  S: Feeling a bit better today.   O: During today's OT group session, the patient participated in an educational segment about the importance of goal-setting and the application of the SMART framework to enhance daily life, particularly focusing on ADLs and iADLs. The session began with  five open-ended pre-session questions that facilitated group discussion and introspection about their current relationship with goals. Following the introduction and educational segment, participants engaged in brainstorming and group discussions to devise hypothetical SMART goals. The session concluded with five post-session questions to reinforce understanding and facilitate reflection. Throughout the session, there was a range of engagement levels noted among the participants.   A:  Patient demonstrated a high level of engagement throughout the session. They actively participated in discussions, sharing personal experiences related to goal setting and challenges faced. Patient was able to clearly articulate an understanding of the SMART framework and proposed personal SMART goals related to their own ADLs with minimal assistance. They expressed enthusiasm about applying what they learned to their daily routine and appeared motivated to make changes.     P: Continue to attend PHP OT group sessions 5x week for 4 weeks to promote daily structure, social engagement, and opportunities to develop and utilize adaptive strategies to maximize functional performance in preparation for safe transition and integration back into school, work, and the community. Plan to address topic of pt 2 in next OT group session.                 OT Education - 09/04/22 2225  Education Details SMART Goals              OT Short Term Goals - 08/27/22 2059       OT SHORT TERM GOAL #1   Title Client will independently seek out and participate in a community or online support group to reinforce coping strategies by time of discharge.    Time 4    Period Weeks    Status On-going    Target Date 09/26/22      OT SHORT TERM GOAL #2   Title By discharge, client will demonstrate the ability to adapt and modify routines when faced with unexpected events or changes, maintaining a balanced approach to  daily tasks.      OT SHORT TERM GOAL #3   Title By the time of discharge, client will independently set, track, and make progress towards a long-term goal, demonstrating resilience in overcoming obstacles and seeking support when needed.                      Plan - 09/04/22 2226     Psychosocial Skills Coping Strategies;Habits;Interpersonal Interaction;Routines and Behaviors             Patient will benefit from skilled therapeutic intervention in order to improve the following deficits and impairments:       Psychosocial Skills: Coping Strategies, Habits, Interpersonal Interaction, Routines and Behaviors   Visit Diagnosis: Difficulty coping    Problem List Patient Active Problem List   Diagnosis Date Noted   Major depressive disorder, recurrent episode, severe (HCC) 08/21/2022   PTSD (post-traumatic stress disorder) 08/21/2022   Pain in left hip 10/11/2020   Depression, major, single episode, mild (HCC) 05/04/2018   Insomnia 05/04/2018   Migraine 07/16/2017   Joint stiffness 11/09/2015   Herpes simplex of female genitalia 08/16/2014   Essential hypertension 07/12/2014   Sickle cell trait (HCC) 07/12/2014    Shirley Nunez, OT 09/04/2022, 10:27 PM  Shirley Nunez, OT   [facility] HOSPITALIZATION PROGRAM [address] [place], [place], [address] Phone: [phone]   Fax:  [phone]  Name: Shirley Nunez MRN: [id number] Date of Birth: 1975-06-16

## 2022-09-05 ENCOUNTER — Other Ambulatory Visit (HOSPITAL_COMMUNITY): Payer: BC Managed Care – PPO | Attending: Psychiatry

## 2022-09-05 ENCOUNTER — Other Ambulatory Visit (HOSPITAL_COMMUNITY): Payer: BC Managed Care – PPO | Attending: Psychiatry | Admitting: Licensed Clinical Social Worker

## 2022-09-05 DIAGNOSIS — F332 Major depressive disorder, recurrent severe without psychotic features: Secondary | ICD-10-CM

## 2022-09-05 DIAGNOSIS — R4589 Other symptoms and signs involving emotional state: Secondary | ICD-10-CM

## 2022-09-05 DIAGNOSIS — F4310 Post-traumatic stress disorder, unspecified: Secondary | ICD-10-CM | POA: Diagnosis not present

## 2022-09-05 NOTE — Progress Notes (Unsigned)
Spoke with patient via WebEx video call, used 2 identifiers to correctly identify patient.States that groups are going well. She has decided not to change her medication and to give it a chance to work. Has a decrease in her appetite only eating once a day since starting her medication. Feels more depressed today. "Not excited to wake up." On scale 1-10 as 10 being worst she rates depression at 8 and anxiety at 7. Has felt passive SI but no plan or intent. Just feels more down and depressed today. Denies HI or AV hallucinations. No side effects from medications. No issues or complaints.

## 2022-09-08 ENCOUNTER — Other Ambulatory Visit (HOSPITAL_COMMUNITY): Payer: BC Managed Care – PPO | Attending: Psychiatry

## 2022-09-08 ENCOUNTER — Encounter (HOSPITAL_COMMUNITY): Payer: Self-pay

## 2022-09-08 ENCOUNTER — Other Ambulatory Visit (HOSPITAL_COMMUNITY): Payer: BC Managed Care – PPO | Attending: Psychiatry | Admitting: Licensed Clinical Social Worker

## 2022-09-08 DIAGNOSIS — F4310 Post-traumatic stress disorder, unspecified: Secondary | ICD-10-CM | POA: Diagnosis not present

## 2022-09-08 DIAGNOSIS — R4589 Other symptoms and signs involving emotional state: Secondary | ICD-10-CM | POA: Diagnosis not present

## 2022-09-08 DIAGNOSIS — F332 Major depressive disorder, recurrent severe without psychotic features: Secondary | ICD-10-CM | POA: Diagnosis not present

## 2022-09-08 NOTE — Therapy (Signed)
[facility] PARTIAL HOSPITALIZATION PROGRAM [address] [place], [place], [address] Phone: [phone]   Fax:  [phone]  Occupational Therapy Treatment Virtual Visit via Video Note  I connected with Breanne Olvera Ackert on 09/08/22 at  8:00 AM EDT by a video enabled telemedicine application and verified that I am speaking with the correct person using two identifiers.  Location: Patient: home Provider: office   I discussed the limitations of evaluation and management by telemedicine and the availability of in person appointments. The patient expressed understanding and agreed to proceed.    The patient was advised to call back or seek an in-person evaluation if the symptoms worsen or if the condition fails to improve as anticipated.  I provided 55 minutes of non-face-to-face time during this encounter.   Patient Details  Name: Shirley Nunez MRN: [id number] Date of Birth: 1975/06/02 No data recorded  Encounter Date: 09/05/2022   OT End of Session - 09/08/22 0930     Visit Number 7    Number of Visits 20    Date for OT Re-Evaluation 09/26/22    OT Start Time 1200    OT Stop Time 1255    OT Time Calculation (min) 55 min             Past Medical History:  Diagnosis Date   Anxiety    Depression    HSV infection    on daily medication   Migraine    Sickle cell anemia (HCC)    Sickle cell trait    Past Surgical History:  Procedure Laterality Date   ENDOMETRIAL ABLATION     WISDOM TOOTH EXTRACTION      There were no vitals filed for this visit.   Subjective Assessment - 09/08/22 0930     Currently in Pain? No/denies    Pain Score 0-No pain                Group Session:  S: Doing well today.   O: During today's OT group session, the patient participated in an educational segment about the importance of goal-setting and the application of the SMART framework to enhance daily life, particularly focusing on ADLs and iADLs. The  session began with five open-ended pre-session questions that facilitated group discussion and introspection about their current relationship with goals. Following the introduction and educational segment, participants engaged in brainstorming and group discussions to devise hypothetical SMART goals. The session concluded with five post-session questions to reinforce understanding and facilitate reflection. Throughout the session, there was a range of engagement levels noted among the participants.   A:  Patient demonstrated a high level of engagement throughout the session. They actively participated in discussions, sharing personal experiences related to goal setting and challenges faced. Patient was able to clearly articulate an understanding of the SMART framework and proposed personal SMART goals related to their own ADLs with minimal assistance. They expressed enthusiasm about applying what they learned to their daily routine and appeared motivated to make changes.    P: Continue to attend PHP OT group sessions 5x week for 4 weeks to promote daily structure, social engagement, and opportunities to develop and utilize adaptive strategies to maximize functional performance in preparation for safe transition and integration back into school, work, and the community. Plan to address topic of tbd in next OT group session.                   OT Education - 09/08/22 0930  Education Details SMART Goals              OT Short Term Goals - 08/27/22 2059       OT SHORT TERM GOAL #1   Title Client will independently seek out and participate in a community or online support group to reinforce coping strategies by time of discharge.    Time 4    Period Weeks    Status On-going    Target Date 09/26/22      OT SHORT TERM GOAL #2   Title By discharge, client will demonstrate the ability to adapt and modify routines when faced with unexpected events or changes, maintaining a  balanced approach to daily tasks.      OT SHORT TERM GOAL #3   Title By the time of discharge, client will independently set, track, and make progress towards a long-term goal, demonstrating resilience in overcoming obstacles and seeking support when needed.                      Plan - 09/08/22 0931     Psychosocial Skills Coping Strategies;Habits;Interpersonal Interaction;Routines and Behaviors             Patient will benefit from skilled therapeutic intervention in order to improve the following deficits and impairments:       Psychosocial Skills: Coping Strategies, Habits, Interpersonal Interaction, Routines and Behaviors   Visit Diagnosis: Difficulty coping    Problem List Patient Active Problem List   Diagnosis Date Noted   Major depressive disorder, recurrent episode, severe (HCC) 08/21/2022   PTSD (post-traumatic stress disorder) 08/21/2022   Pain in left hip 10/11/2020   Depression, major, single episode, mild (HCC) 05/04/2018   Insomnia 05/04/2018   Migraine 07/16/2017   Joint stiffness 11/09/2015   Herpes simplex of female genitalia 08/16/2014   Essential hypertension 07/12/2014   Sickle cell trait (HCC) 07/12/2014    [name], OT 09/08/2022, 9:31 AM  [name], OT   [facility] HOSPITALIZATION PROGRAM [address] [place], [place], [address] Phone: [phone]   Fax:  [phone]  Name: NASHA DISS MRN: [id number] Date of Birth: 04-07-1976

## 2022-09-09 ENCOUNTER — Other Ambulatory Visit (HOSPITAL_COMMUNITY): Payer: BC Managed Care – PPO | Attending: Psychiatry | Admitting: Licensed Clinical Social Worker

## 2022-09-09 ENCOUNTER — Encounter (HOSPITAL_COMMUNITY): Payer: Self-pay

## 2022-09-09 ENCOUNTER — Other Ambulatory Visit (HOSPITAL_COMMUNITY): Payer: BC Managed Care – PPO | Attending: Psychiatry

## 2022-09-09 DIAGNOSIS — Z79899 Other long term (current) drug therapy: Secondary | ICD-10-CM | POA: Insufficient documentation

## 2022-09-09 DIAGNOSIS — Z5941 Food insecurity: Secondary | ICD-10-CM | POA: Insufficient documentation

## 2022-09-09 DIAGNOSIS — Z5986 Financial insecurity: Secondary | ICD-10-CM | POA: Diagnosis not present

## 2022-09-09 DIAGNOSIS — F410 Panic disorder [episodic paroxysmal anxiety] without agoraphobia: Secondary | ICD-10-CM | POA: Insufficient documentation

## 2022-09-09 DIAGNOSIS — R4589 Other symptoms and signs involving emotional state: Secondary | ICD-10-CM

## 2022-09-09 DIAGNOSIS — Z9151 Personal history of suicidal behavior: Secondary | ICD-10-CM | POA: Insufficient documentation

## 2022-09-09 DIAGNOSIS — F4310 Post-traumatic stress disorder, unspecified: Secondary | ICD-10-CM | POA: Diagnosis not present

## 2022-09-09 DIAGNOSIS — F411 Generalized anxiety disorder: Secondary | ICD-10-CM | POA: Insufficient documentation

## 2022-09-09 DIAGNOSIS — F332 Major depressive disorder, recurrent severe without psychotic features: Secondary | ICD-10-CM | POA: Insufficient documentation

## 2022-09-09 MED ORDER — SERTRALINE HCL 50 MG PO TABS: 50.0000 mg | ORAL_TABLET | Freq: Every day | ORAL | 0 refills | Status: DC
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
## 2022-09-09 NOTE — Therapy (Signed)
[facility] PARTIAL HOSPITALIZATION PROGRAM [address] [place], [place], [address] Phone: [phone]   Fax:  [phone]  Occupational Therapy Treatment Virtual Visit via Video Note  I connected with Shirley Nunez on 09/09/22 at  8:00 AM EDT by a video enabled telemedicine application and verified that I am speaking with the correct person using two identifiers.  Location: Patient: home Provider: office   I discussed the limitations of evaluation and management by telemedicine and the availability of in person appointments. The patient expressed understanding and agreed to proceed.    The patient was advised to call back or seek an in-person evaluation if the symptoms worsen or if the condition fails to improve as anticipated.  I provided 55 minutes of non-face-to-face time during this encounter.   Patient Details  Name: Shirley Nunez MRN: [id number] Date of Birth: 03/05/1976 No data recorded  Encounter Date: 09/08/2022   OT End of Session - 09/09/22 1008     Visit Number 8    Number of Visits 20    Date for OT Re-Evaluation 09/26/22    OT Start Time 1200    OT Stop Time 1255    OT Time Calculation (min) 55 min             Past Medical History:  Diagnosis Date   Anxiety    Depression    HSV infection    on daily medication   Migraine    Sickle cell anemia (HCC)    Sickle cell trait    Past Surgical History:  Procedure Laterality Date   ENDOMETRIAL ABLATION     WISDOM TOOTH EXTRACTION      There were no vitals filed for this visit.   Subjective Assessment - 09/09/22 1008     Currently in Pain? No/denies    Pain Score 0-No pain                Group Session:  S: Feeling okay today.   O: The objective of this presentation is to provide a comprehensive understanding of the concept of "motivation" and its role in human behavior and well-being. The content covers various theories of motivation, including intrinsic  and extrinsic motivators, and explores the psychological mechanisms that drive individuals to achieve goals, overcome obstacles, and make decisions. By diving into real-world applications, the presentation aims to offer actionable strategies for enhancing motivation in different life domains, such as work, relationships, and personal growth. Utilizing a multi-disciplinary approach, this presentation integrates insights from psychology, neuroscience, and behavioral economics to present a holistic view of motivation. The objective is not only to educate the audience about the complexities and driving forces behind motivation but also to equip them with practical tools and techniques to improve their own motivation levels. By the end of the presentation, attendees should have a well-rounded understanding of what motivates human actions and how to harness this knowledge for personal and professional betterment.   A: The patient demonstrates a high level of engagement during the session, actively participating in discussions about motivation theories and their applicability to their own life. They show keen interest in learning new strategies to improve their motivation and even offer examples from their own experiences that align with the theories presented. Their level of self-awareness and willingness to invest in self-improvement suggest that they are well-positioned to benefit from the practical tools and techniques discussed. The patient's ability to articulate their goals and challenges further supports the likelihood of successfully  implementing the strategies presented.    P: Continue to attend PHP OT group sessions 5x week for 4 weeks to promote daily structure, social engagement, and opportunities to develop and utilize adaptive strategies to maximize functional performance in preparation for safe transition and integration back into school, work, and the community. Plan to address topic of pt 2 in  next OT group session.                   OT Education - 09/09/22 1008     Education Details Motivation              OT Short Term Goals - 08/27/22 2059       OT SHORT TERM GOAL #1   Title Client will independently seek out and participate in a community or online support group to reinforce coping strategies by time of discharge.    Time 4    Period Weeks    Status On-going    Target Date 09/26/22      OT SHORT TERM GOAL #2   Title By discharge, client will demonstrate the ability to adapt and modify routines when faced with unexpected events or changes, maintaining a balanced approach to daily tasks.      OT SHORT TERM GOAL #3   Title By the time of discharge, client will independently set, track, and make progress towards a long-term goal, demonstrating resilience in overcoming obstacles and seeking support when needed.                      Plan - 09/09/22 1008     Psychosocial Skills Coping Strategies;Habits;Interpersonal Interaction;Routines and Behaviors             Patient will benefit from skilled therapeutic intervention in order to improve the following deficits and impairments:       Psychosocial Skills: Coping Strategies, Habits, Interpersonal Interaction, Routines and Behaviors   Visit Diagnosis: Difficulty coping    Problem List Patient Active Problem List   Diagnosis Date Noted   Major depressive disorder, recurrent episode, severe (HCC) 08/21/2022   PTSD (post-traumatic stress disorder) 08/21/2022   Pain in left hip 10/11/2020   Depression, major, single episode, mild (HCC) 05/04/2018   Insomnia 05/04/2018   Migraine 07/16/2017   Joint stiffness 11/09/2015   Herpes simplex of female genitalia 08/16/2014   Essential hypertension 07/12/2014   Sickle cell trait (HCC) 07/12/2014    [name], OT 09/09/2022, 10:09 AM  [name], OT   [facility] HOSPITALIZATION PROGRAM [address] [place], [place], [address] Phone: [phone]   Fax:  [phone]  Name: Shirley Nunez MRN: [id number] Date of Birth: [birth year]

## 2022-09-09 NOTE — Therapy (Signed)
[facility] PARTIAL HOSPITALIZATION PROGRAM [address] [place], [place], [address] Phone: [phone]   Fax:  [phone]  Occupational Therapy Treatment Virtual Visit via Video Note  I connected with Shirley Nunez on 09/09/22 at  8:00 AM EDT by a video enabled telemedicine application and verified that I am speaking with the correct person using two identifiers.  Location: Patient: home Provider: office   I discussed the limitations of evaluation and management by telemedicine and the availability of in person appointments. The patient expressed understanding and agreed to proceed.    The patient was advised to call back or seek an in-person evaluation if the symptoms worsen or if the condition fails to improve as anticipated.  I provided 55 minutes of non-face-to-face time during this encounter.   Patient Details  Name: Shirley Nunez MRN: [id number] Date of Birth: 1976/01/10 No data recorded  Encounter Date: 09/09/2022   OT End of Session - 09/09/22 1749     Visit Number 9    Number of Visits 20    Date for OT Re-Evaluation 09/26/22    OT Start Time 1200    OT Stop Time 1255    OT Time Calculation (min) 55 min             Past Medical History:  Diagnosis Date   Anxiety    Depression    HSV infection    on daily medication   Migraine    Sickle cell anemia (HCC)    Sickle cell trait    Past Surgical History:  Procedure Laterality Date   ENDOMETRIAL ABLATION     WISDOM TOOTH EXTRACTION      There were no vitals filed for this visit.   Subjective Assessment - 09/09/22 1749     Currently in Pain? No/denies    Pain Score 0-No pain                Group Session:  S: feeling a bit better today.   O: The objective of this presentation is to provide a comprehensive understanding of the concept of "motivation" and its role in human behavior and well-being. The content covers various theories of motivation, including  intrinsic and extrinsic motivators, and explores the psychological mechanisms that drive individuals to achieve goals, overcome obstacles, and make decisions. By diving into real-world applications, the presentation aims to offer actionable strategies for enhancing motivation in different life domains, such as work, relationships, and personal growth. Utilizing a multi-disciplinary approach, this presentation integrates insights from psychology, neuroscience, and behavioral economics to present a holistic view of motivation. The objective is not only to educate the audience about the complexities and driving forces behind motivation but also to equip them with practical tools and techniques to improve their own motivation levels. By the end of the presentation, attendees should have a well-rounded understanding of what motivates human actions and how to harness this knowledge for personal and professional betterment.   A:  The patient demonstrates a high level of engagement during the session, actively participating in discussions about motivation theories and their applicability to their own life. They show keen interest in learning new strategies to improve their motivation and even offer examples from their own experiences that align with the theories presented. Their level of self-awareness and willingness to invest in self-improvement suggest that they are well-positioned to benefit from the practical tools and techniques discussed. The patient's ability to articulate their goals and challenges further supports the  likelihood of successfully implementing the strategies presented.    P: Continue to attend PHP OT group sessions 5x week for 4 weeks to promote daily structure, social engagement, and opportunities to develop and utilize adaptive strategies to maximize functional performance in preparation for safe transition and integration back into school, work, and the community. Plan to address topic of  pt 3 in next OT group session.                   OT Education - 09/09/22 1749     Education Details Motivation              OT Short Term Goals - 08/27/22 2059       OT SHORT TERM GOAL #1   Title Client will independently seek out and participate in a community or online support group to reinforce coping strategies by time of discharge.    Time 4    Period Weeks    Status On-going    Target Date 09/26/22      OT SHORT TERM GOAL #2   Title By discharge, client will demonstrate the ability to adapt and modify routines when faced with unexpected events or changes, maintaining a balanced approach to daily tasks.      OT SHORT TERM GOAL #3   Title By the time of discharge, client will independently set, track, and make progress towards a long-term goal, demonstrating resilience in overcoming obstacles and seeking support when needed.                      Plan - 09/09/22 1750     Psychosocial Skills Coping Strategies;Habits;Interpersonal Interaction;Routines and Behaviors             Patient will benefit from skilled therapeutic intervention in order to improve the following deficits and impairments:       Psychosocial Skills: Coping Strategies, Habits, Interpersonal Interaction, Routines and Behaviors   Visit Diagnosis: Difficulty coping    Problem List Patient Active Problem List   Diagnosis Date Noted   Major depressive disorder, recurrent episode, severe (HCC) 08/21/2022   PTSD (post-traumatic stress disorder) 08/21/2022   Pain in left hip 10/11/2020   Depression, major, single episode, mild (HCC) 05/04/2018   Insomnia 05/04/2018   Migraine 07/16/2017   Joint stiffness 11/09/2015   Herpes simplex of female genitalia 08/16/2014   Essential hypertension 07/12/2014   Sickle cell trait (HCC) 07/12/2014    [name], OT 09/09/2022, 5:50 PM  [name], OT   [facility] HOSPITALIZATION  PROGRAM [address] [place], [place], [address] Phone: [phone]   Fax:  [phone]  Name: Shirley Nunez MRN: [id number] Date of Birth: 07-31-1975

## 2022-09-10 ENCOUNTER — Other Ambulatory Visit (HOSPITAL_COMMUNITY): Payer: BC Managed Care – PPO | Attending: Psychiatry

## 2022-09-10 ENCOUNTER — Other Ambulatory Visit (HOSPITAL_COMMUNITY): Payer: BC Managed Care – PPO | Admitting: Licensed Clinical Social Worker

## 2022-09-10 DIAGNOSIS — F332 Major depressive disorder, recurrent severe without psychotic features: Secondary | ICD-10-CM | POA: Diagnosis not present

## 2022-09-10 DIAGNOSIS — F4310 Post-traumatic stress disorder, unspecified: Secondary | ICD-10-CM | POA: Diagnosis not present

## 2022-09-10 DIAGNOSIS — Z79624 Long term (current) use of inhibitors of nucleotide synthesis: Secondary | ICD-10-CM | POA: Diagnosis not present

## 2022-09-10 DIAGNOSIS — Z79899 Other long term (current) drug therapy: Secondary | ICD-10-CM | POA: Insufficient documentation

## 2022-09-10 DIAGNOSIS — Z5986 Financial insecurity: Secondary | ICD-10-CM | POA: Diagnosis not present

## 2022-09-10 DIAGNOSIS — F411 Generalized anxiety disorder: Secondary | ICD-10-CM | POA: Insufficient documentation

## 2022-09-10 DIAGNOSIS — R4589 Other symptoms and signs involving emotional state: Secondary | ICD-10-CM

## 2022-09-10 DIAGNOSIS — D573 Sickle-cell trait: Secondary | ICD-10-CM | POA: Diagnosis not present

## 2022-09-10 NOTE — Psych (Signed)
Virtual Visit via Video Note  I connected with Shirley Nunez on 08/28/22 at  9:00 AM EDT by a video enabled telemedicine application and verified that I am speaking with the correct person using two identifiers.  Location: Patient: patient home Provider: clinical home office   I discussed the limitations of evaluation and management by telemedicine and the availability of in person appointments. The patient expressed understanding and agreed to proceed.  I discussed the assessment and treatment plan with the patient. The patient was provided an opportunity to ask questions and all were answered. The patient agreed with the plan and demonstrated an understanding of the instructions.   The patient was advised to call back or seek an in-person evaluation if the symptoms worsen or if the condition fails to improve as anticipated.  Pt was provided 240 minutes of non-face-to-face time during this encounter.   Donia Guiles, LCSW   [facility] BH PHP THERAPIST PROGRESS NOTE  Shirley Nunez [id number]  Session Time: 9:00 - 10:00  Participation Level: Active  Behavioral Response: CasualAlertDepressed  Type of Therapy: Group Therapy  Treatment Goals addressed: Coping  Progress Towards Goals: Initial  Interventions: CBT, DBT, Supportive, and Reframing  Summary: Shirley Nunez is a 47 y.o. female who presents with depression and trauma symptoms.  Clinician led check-in regarding current stressors and situation, and review of patient completed daily inventory. Clinician utilized active listening and empathetic response and validated patient emotions. Clinician facilitated processing group on pertinent issues.?    Therapist Response: Patient arrived within time allowed. Patient rates her mood at a 8.5 on a scale of 1-10 with 10 being best. Pt states she feels "really good." Pt states she slept 10 hours and ate 1x. Pt reports her day went "super well" yesterday and that paired with her increased  sleep improved her mood. Pt states a friend surprised her with a massage and nice dinner and she went to church. Pt states she did not take her night medication, but took 3 tylenol PMs due to concern of drowsiness she felt the previous day. Cln will inform psychiatrist.  Patient able to process. Patient engaged in discussion.            Session Time: 10:00 am - 11:00 am   Participation Level: Active   Behavioral Response: CasualAlertDepressed   Type of Therapy: Group Therapy   Treatment Goals addressed: Coping   Progress Towards Goals: Progressing   Interventions: CBT, DBT, Solution Focused, Strength-based, Supportive, and Reframing   Therapist Response: Cln led discussion on guilt and the way it impacts [place]. Cln utilized CBT cognitive distortion: emotional reasoning to inform discussion. Cln encouraged pt's to consider whether the guilt was founded as a first step to address the feeling. Group members discussed feelings of guilt and worked to determine whether those feelings were founded in truth or feeling.   Therapist Response: Pt engaged in discussion and is able to offer a guilt example and work through it with the group.          Session Time: 11:00 -12:00   Participation Level: Active   Behavioral Response: CasualAlertDepressed   Type of Therapy: Group Therapy   Treatment Goals addressed: Coping   Progress Towards Goals: Progressing   Interventions: CBT, DBT, Solution Focused, Strength-based, Supportive, and Reframing   Summary: Cln continued topic of boundaries. Cln discussed the different ways boundaries present: physical, emotional, intellectual, sexual, material, and time. Group talked about the ways in which each type presents for them  and is a struggle.    Therapist Response: Pt engaged in discussion and identifies ways each type creates struggle.         Session Time: 12:00 -1:00   Participation Level: Active   Behavioral Response:  CasualAlertDepressed   Type of Therapy: Group therapy, Occupational Therapy   Treatment Goals addressed: Coping   Progress Towards Goals: Progressing   Interventions: Supportive; Psychoeducation   Summary: 12:00 - 12:50: Occupational Therapy group led by cln E. Hollan. 12:50 - 1:00 Clinician assessed for immediate needs, medication compliance and efficacy, and safety concerns.   Therapist Response: 12:00 - 12:50: See OT note 12:50 - 1:00 pm: At check-out, patient reports no immediate concerns. Patient demonstrates progress as evidenced by continued engagement and responsiveness to treatment. Patient denies SI/HI/self-harm thoughts at the end of group.      Suicidal/Homicidal: Nowithout intent/plan  Plan: Pt will continue in PHP while working to decrease trauma and depression symptoms, increase daily functioning, and increase ability to manage symptoms in a healthy manner.   Collaboration of Care: Medication Management AEB J. McQuilla  Patient/Guardian was advised Release of Information must be obtained prior to any record release in order to collaborate their care with an outside provider. Patient/Guardian was advised if they have not already done so to contact the registration department to sign all necessary forms in order for [place] to release information regarding their care.   Consent: Patient/Guardian gives verbal consent for treatment and assignment of benefits for services provided during this visit. Patient/Guardian expressed understanding and agreed to proceed.   Diagnosis: Severe episode of recurrent major depressive disorder, without psychotic features (HCC) [F33.2]    1. Severe episode of recurrent major depressive disorder, without psychotic features (HCC)   2. PTSD (post-traumatic stress disorder)      Donia Guiles, LCSW

## 2022-09-10 NOTE — Psych (Signed)
Virtual Visit via Video Note  I connected with Shirley Nunez on 08/27/22 at  9:00 AM EDT by a video enabled telemedicine application and verified that I am speaking with the correct person using two identifiers.  Location: Patient: patient home Provider: clinical home office   I discussed the limitations of evaluation and management by telemedicine and the availability of in person appointments. The patient expressed understanding and agreed to proceed.  I discussed the assessment and treatment plan with the patient. The patient was provided an opportunity to ask questions and all were answered. The patient agreed with the plan and demonstrated an understanding of the instructions.   The patient was advised to call back or seek an in-person evaluation if the symptoms worsen or if the condition fails to improve as anticipated.  Pt was provided 240 minutes of non-face-to-face time during this encounter.   Donia Guiles, LCSW   [facility] BH PHP THERAPIST PROGRESS NOTE  Shirley Nunez [id number]  Session Time: 9:00 - 10:00  Participation Level: Active  Behavioral Response: CasualAlertDepressed  Type of Therapy: Group Therapy  Treatment Goals addressed: Coping  Progress Towards Goals: Initial  Interventions: CBT, DBT, Supportive, and Reframing  Summary: Shirley Nunez is a 47 y.o. female who presents with depression and trauma symptoms.  Clinician led check-in regarding current stressors and situation, and review of patient completed daily inventory. Clinician utilized active listening and empathetic response and validated patient emotions. Clinician facilitated processing group on pertinent issues.?    Therapist Response: Patient arrived within time allowed. Patient rates her mood at a 6 on a scale of 1-10 with 10 being best. Pt states she feels "irritated, tired." Pt states she slept 3.5 hours and ate 1x. Pt reports she had a busy day yesterday and felt burnt out. Pt shares feeling  guilty about not doing as much as she is used to. Pt reports feeling guilty is a common issue for her. Patient able to process. Patient engaged in discussion.            Session Time: 10:00 am - 11:00 am   Participation Level: Active   Behavioral Response: CasualAlertDepressed   Type of Therapy: Group Therapy   Treatment Goals addressed: Coping   Progress Towards Goals: Progressing   Interventions: CBT, DBT, Solution Focused, Strength-based, Supportive, and Reframing   Therapist Response: Cln led processing group for pt's current struggles. Group members shared stressors and provided support and feedback. Cln brought in topics of boundaries, healthy relationships, and unhealthy thought processes to inform discussion.    Therapist Response: Pt able to process and provide support to group.            Session Time: 11:00 -12:00   Participation Level: Active   Behavioral Response: CasualAlertDepressed   Type of Therapy: Group Therapy, Spiritual Care   Treatment Goals addressed: Coping   Progress Towards Goals: Progressing   Interventions: Supportive, Education   Summary:  [name], Chaplain, led group.   Therapist Response: Pt participated         Session Time: 12:00 -1:00   Participation Level: Active   Behavioral Response: CasualAlertDepressed   Type of Therapy: Group therapy, Occupational Therapy   Treatment Goals addressed: Coping   Progress Towards Goals: Progressing   Interventions: Supportive; Psychoeducation   Summary: 12:00 - 12:50: Occupational Therapy group led by cln E. Hollan. 12:50 - 1:00 Clinician assessed for immediate needs, medication compliance and efficacy, and safety concerns.   Therapist Response: 12:00 - 12:50:  See OT note 12:50 - 1:00 pm: At check-out, patient reports no immediate concerns. Patient demonstrates progress as evidenced by continued engagement and responsiveness to treatment. Patient denies SI/HI/self-harm thoughts  at the end of group.      Suicidal/Homicidal: Nowithout intent/plan  Plan: Pt will continue in PHP while working to decrease trauma and depression symptoms, increase daily functioning, and increase ability to manage symptoms in a healthy manner.   Collaboration of Care: Medication Management AEB J. McQuilla  Patient/Guardian was advised Release of Information must be obtained prior to any record release in order to collaborate their care with an outside provider. Patient/Guardian was advised if they have not already done so to contact the registration department to sign all necessary forms in order for [place] to release information regarding their care.   Consent: Patient/Guardian gives verbal consent for treatment and assignment of benefits for services provided during this visit. Patient/Guardian expressed understanding and agreed to proceed.   Diagnosis: Severe episode of recurrent major depressive disorder, without psychotic features (HCC) [F33.2]    1. Severe episode of recurrent major depressive disorder, without psychotic features (HCC)   2. PTSD (post-traumatic stress disorder)      Donia Guiles, LCSW

## 2022-09-10 NOTE — Psych (Signed)
Virtual Visit via Video Note  I connected with Shirley Nunez on 08/29/22 at  9:00 AM EDT by a video enabled telemedicine application and verified that I am speaking with the correct person using two identifiers.  Location: Patient: patient home Provider: clinical home office   I discussed the limitations of evaluation and management by telemedicine and the availability of in person appointments. The patient expressed understanding and agreed to proceed.  I discussed the assessment and treatment plan with the patient. The patient was provided an opportunity to ask questions and all were answered. The patient agreed with the plan and demonstrated an understanding of the instructions.   The patient was advised to call back or seek an in-person evaluation if the symptoms worsen or if the condition fails to improve as anticipated.  Pt was provided 240 minutes of non-face-to-face time during this encounter.   Shirley Guiles, LCSW   [facility] BH PHP THERAPIST PROGRESS NOTE  Shirley Nunez [id number]  Session Time: 9:00 - 10:00  Participation Level: Active  Behavioral Response: CasualAlertDepressed  Type of Therapy: Group Therapy  Treatment Goals addressed: Coping  Progress Towards Goals: Initial  Interventions: CBT, DBT, Supportive, and Reframing  Summary: Shirley Nunez is a 47 y.o. female who presents with depression and trauma symptoms.  Clinician led check-in regarding current stressors and situation, and review of patient completed daily inventory. Clinician utilized active listening and empathetic response and validated patient emotions. Clinician facilitated processing group on pertinent issues.?    Therapist Response: Patient arrived within time allowed. Patient rates her mood at a 5 on a scale of 1-10 with 10 being best. Pt states she feels "raw." Pt states she slept 5.5 hours and ate 2x. Pt reports she had a "big revelation" during group yesterday about how past relationships have  hurt her and it "through me for a loop." Pt states she reached out to a friend to process it which helped. Pt reports struggle with her ex-husband/co-parent and states he made her "feel small" yesterday. Pt reports difficulty making sense of her feelings. Patient able to process. Patient engaged in discussion.            Session Time: 10:00 am - 11:00 am   Participation Level: Active   Behavioral Response: CasualAlertDepressed   Type of Therapy: Group Therapy   Treatment Goals addressed: Coping   Progress Towards Goals: Progressing   Interventions: CBT, DBT, Solution Focused, Strength-based, Supportive, and Reframing   Therapist Response: Cln led discussion on unknowns and the way they impact our anxiety. Cln discussed cognitive restructuring, DBT distraction skills, and positive mantras/prayer as ways to address the anxiety. Cln discussed idea "I can do hard things" and ways to bolster confidence in our ability to handle difficult situations.    Therapist Response: Pt shared current unknowns they are dealing with and is able to identify ways to manage.          Session Time: 11:00 -12:00   Participation Level: Active   Behavioral Response: CasualAlertDepressed   Type of Therapy: Group Therapy   Treatment Goals addressed: Coping   Progress Towards Goals: Progressing   Interventions: CBT, DBT, Solution Focused, Strength-based, Supportive, and Reframing   Summary: Cln continued topic of boundaries and led a "boundary workshop" in which group members brought current boundary issues and group worked together to apply boundary concepts to help address the concern. Cln helped shape conversation to maintain fidelity.    Therapist Response:  Pt engaged in discussion and  shared a current boundary issue and reports gaining insight.          Session Time: 12:00 -1:00   Participation Level: Active   Behavioral Response: CasualAlertDepressed   Type of Therapy: Group therapy,  Occupational Therapy   Treatment Goals addressed: Coping   Progress Towards Goals: Progressing   Interventions: Supportive; Psychoeducation   Summary: 12:00 - 12:50: Occupational Therapy group led by cln E. Hollan. 12:50 - 1:00 Clinician assessed for immediate needs, medication compliance and efficacy, and safety concerns.   Therapist Response: 12:00 - 12:50: See OT note 12:50 - 1:00 pm: At check-out, patient reports no immediate concerns. Patient demonstrates progress as evidenced by continued engagement and responsiveness to treatment. Patient denies SI/HI/self-harm thoughts at the end of group.      Suicidal/Homicidal: Nowithout intent/plan  Plan: Pt will continue in PHP while working to decrease trauma and depression symptoms, increase daily functioning, and increase ability to manage symptoms in a healthy manner.   Collaboration of Care: Medication Management AEB J. McQuilla  Patient/Guardian was advised Release of Information must be obtained prior to any record release in order to collaborate their care with an outside provider. Patient/Guardian was advised if they have not already done so to contact the registration department to sign all necessary forms in order for [place] to release information regarding their care.   Consent: Patient/Guardian gives verbal consent for treatment and assignment of benefits for services provided during this visit. Patient/Guardian expressed understanding and agreed to proceed.   Diagnosis: Severe episode of recurrent major depressive disorder, without psychotic features (HCC) [F33.2]    1. Severe episode of recurrent major depressive disorder, without psychotic features (HCC)   2. PTSD (post-traumatic stress disorder)      Shirley Guiles, LCSW

## 2022-09-10 NOTE — Psych (Signed)
Virtual Visit via Video Note  I connected with Shirley Nunez on 09/01/22 at  9:00 AM EDT by a video enabled telemedicine application and verified that I am speaking with the correct person using two identifiers.  Location: Patient: patient home Provider: clinical home office   I discussed the limitations of evaluation and management by telemedicine and the availability of in person appointments. The patient expressed understanding and agreed to proceed.  I discussed the assessment and treatment plan with the patient. The patient was provided an opportunity to ask questions and all were answered. The patient agreed with the plan and demonstrated an understanding of the instructions.   The patient was advised to call back or seek an in-person evaluation if the symptoms worsen or if the condition fails to improve as anticipated.  Pt was provided 240 minutes of non-face-to-face time during this encounter.   Shirley Guiles, LCSW   [facility] BH PHP THERAPIST PROGRESS NOTE  Shirley Nunez [id number]  Session Time: 9:00 - 10:00  Participation Level: Active  Behavioral Response: CasualAlertDepressed  Type of Therapy: Group Therapy  Treatment Goals addressed: Coping  Progress Towards Goals: Progressing  Interventions: CBT, DBT, Supportive, and Reframing  Summary: Shirley Nunez is a 47 y.o. female who presents with depression and trauma symptoms.  Clinician led check-in regarding current stressors and situation, and review of patient completed daily inventory. Clinician utilized active listening and empathetic response and validated patient emotions. Clinician facilitated processing group on pertinent issues.?    Therapist Response: Patient arrived within time allowed. Patient rates her mood at a 2 on a scale of 1-10 with 10 being best. Pt states she feels "kind of blah." Pt states she slept 3.5 hours and ate 2x. Pt reports her weekend was "okay" however she experienced a spike of grief at her  son's prom expericne, thinking of her deceased child and how he doesn't get to have these milestones. Pt states difficulty rebounding and staying in bed all day Sunday. Pt reports stress in dealing with her leave company that increased her low mood this morning.  Patient able to process. Patient engaged in discussion.            Session Time: 10:00 am - 11:00 am   Participation Level: Active   Behavioral Response: CasualAlertDepressed   Type of Therapy: Group Therapy   Treatment Goals addressed: Coping   Progress Towards Goals: Progressing   Interventions: CBT, DBT, Solution Focused, Strength-based, Supportive, and Reframing   Therapist Response: Cln led discussion on deep breathing and its therapeutic benefits, using DBT TIPP skills to inform discussion. Group practiced how to breathe from their diaphragms to ensure therapeutic quality and different ways to keep track of regulating breaths.   Therapist Response:  Pt engaged in discussion and practice.          Session Time: 11:00 -12:00   Participation Level: Active   Behavioral Response: CasualAlertDepressed   Type of Therapy: Group Therapy   Treatment Goals addressed: Coping   Progress Towards Goals: Progressing   Interventions: CBT, DBT, Solution Focused, Strength-based, Supportive, and Reframing   Summary: Cln led discussion on increasing support. Group members shared what supports they have and where it is lacking. Cln encouraged ways to increase adult friendships including accepting invitations, joining clubs/activities, reaching out, and support groups.    Therapist Response: Pt engaged in discussion and is able to determine ways to increase their support.         Session Time: 12:00 -1:00  Participation Level: Active   Behavioral Response: CasualAlertDepressed   Type of Therapy: Group therapy, Occupational Therapy   Treatment Goals addressed: Coping   Progress Towards Goals: Progressing    Interventions: Supportive; Psychoeducation   Summary: 12:00 - 12:50: Occupational Therapy group led by cln E. Hollan. 12:50 - 1:00 Clinician assessed for immediate needs, medication compliance and efficacy, and safety concerns.   Therapist Response: 12:00 - 12:50: See OT note 12:50 - 1:00 pm: At check-out, patient reports no immediate concerns. Patient demonstrates progress as evidenced by continued engagement and responsiveness to treatment. Patient denies SI/HI/self-harm thoughts at the end of group.      Suicidal/Homicidal: Nowithout intent/plan  Plan: Pt will continue in PHP while working to decrease trauma and depression symptoms, increase daily functioning, and increase ability to manage symptoms in a healthy manner.   Collaboration of Care: Medication Management AEB J. McQuilla  Patient/Guardian was advised Release of Information must be obtained prior to any record release in order to collaborate their care with an outside provider. Patient/Guardian was advised if they have not already done so to contact the registration department to sign all necessary forms in order for [place] to release information regarding their care.   Consent: Patient/Guardian gives verbal consent for treatment and assignment of benefits for services provided during this visit. Patient/Guardian expressed understanding and agreed to proceed.   Diagnosis: Severe episode of recurrent major depressive disorder, without psychotic features (HCC) [F33.2]    1. Severe episode of recurrent major depressive disorder, without psychotic features (HCC)   2. PTSD (post-traumatic stress disorder)      Shirley Guiles, LCSW

## 2022-09-10 NOTE — Psych (Signed)
Virtual Visit via Video Note  I connected with Shirley Nunez on 08/26/22 at  9:00 AM EDT by a video enabled telemedicine application and verified that I am speaking with the correct person using two identifiers.  Location: Patient: patient home Provider: clinical home office   I discussed the limitations of evaluation and management by telemedicine and the availability of in person appointments. The patient expressed understanding and agreed to proceed.  I discussed the assessment and treatment plan with the patient. The patient was provided an opportunity to ask questions and all were answered. The patient agreed with the plan and demonstrated an understanding of the instructions.   The patient was advised to call back or seek an in-person evaluation if the symptoms worsen or if the condition fails to improve as anticipated.  Pt was provided 240 minutes of non-face-to-face time during this encounter.   Donia Guiles, LCSW   [facility] PHP THERAPIST PROGRESS NOTE  Shirley Nunez [id number]  Session Time: 9:00 - 10:00  Participation Level: Active  Behavioral Response: CasualAlertDepressed  Type of Therapy: Group Therapy  Treatment Goals addressed: Coping  Progress Towards Goals: Initial  Interventions: CBT, DBT, Supportive, and Reframing  Summary: Shirley Nunez is a 47 y.o. female who presents with depression and trauma symptoms.  Clinician led check-in regarding current stressors and situation, and review of patient completed daily inventory. Clinician utilized active listening and empathetic response and validated patient emotions. Clinician facilitated processing group on pertinent issues.?    Therapist Response: Patient met with psychiatrist during this session. See note.            Session Time: 10:00 am - 11:00 am   Participation Level: Active   Behavioral Response: CasualAlertDepressed   Type of Therapy: Group Therapy   Treatment Goals addressed: Coping    Progress Towards Goals: Progressing   Interventions: CBT, DBT, Solution Focused, Strength-based, Supportive, and Reframing   Therapist Response: Cln led discussion on healthy aggression substitutes. Cln discussed the benefits to discharging energy and adrenaline when feeling "revved up" in emotion and the importance of balancing that discharge with safety and lack if negative consequences. Group brainstormed different ways to channel aggression in a healthy way and shared ways that have worked for them in the past.   Therapist Response: Pt engaged in discussion and identifies 3 options to try.          Session Time: 11:00 -12:00   Participation Level: Active   Behavioral Response: CasualAlertDepressed   Type of Therapy: Group Therapy   Treatment Goals addressed: Coping   Progress Towards Goals: Progressing   Interventions: CBT, DBT, Solution Focused, Strength-based, Supportive, and Reframing   Summary: Cln introduced topic of boundaries. Cln discussed how boundaries inform our relationships and affect self-esteem and personal agency. Group discussed the three types of boundaries: rigid, porous, and healthy and when each type is most helpful/harmful.    Therapist Response: Pt engaged in discussion and reports having mostly porous boundaries.            Session Time: 12:00 -1:00   Participation Level: Active   Behavioral Response: CasualAlertDepressed   Type of Therapy: Group therapy, Occupational Therapy   Treatment Goals addressed: Coping   Progress Towards Goals: Progressing   Interventions: Supportive; Psychoeducation   Summary: 12:00 - 12:50: Occupational Therapy group led by cln E. Hollan. 12:50 - 1:00 Clinician assessed for immediate needs, medication compliance and efficacy, and safety concerns.   Therapist Response: 12:00 - 12:50: See  OT note 12:50 - 1:00 pm: At check-out, patient reports no immediate concerns. Patient demonstrates progress as evidenced by  participating in first group session. Patient denies SI/HI/self-harm thoughts at the end of group.      Suicidal/Homicidal: Nowithout intent/plan  Plan: Pt will continue in PHP while working to decrease trauma and depression symptoms, increase daily functioning, and increase ability to manage symptoms in a healthy manner.   Collaboration of Care: Medication Management AEB Shirley Nunez  Patient/Guardian was advised Release of Information must be obtained prior to any record release in order to collaborate their care with an outside provider. Patient/Guardian was advised if they have not already done so to contact the registration department to sign all necessary forms in order for [place] to release information regarding their care.   Consent: Patient/Guardian gives verbal consent for treatment and assignment of benefits for services provided during this visit. Patient/Guardian expressed understanding and agreed to proceed.   Diagnosis: PTSD (post-traumatic stress disorder) [F43.10]    1. PTSD (post-traumatic stress disorder)   2. Severe episode of recurrent major depressive disorder, without psychotic features (HCC)      Donia Guiles, LCSW

## 2022-09-11 ENCOUNTER — Other Ambulatory Visit (HOSPITAL_COMMUNITY): Payer: BC Managed Care – PPO | Admitting: Licensed Clinical Social Worker

## 2022-09-11 ENCOUNTER — Encounter (HOSPITAL_COMMUNITY): Payer: Self-pay

## 2022-09-11 ENCOUNTER — Other Ambulatory Visit (HOSPITAL_COMMUNITY): Payer: BC Managed Care – PPO

## 2022-09-11 DIAGNOSIS — F4310 Post-traumatic stress disorder, unspecified: Secondary | ICD-10-CM

## 2022-09-11 DIAGNOSIS — F332 Major depressive disorder, recurrent severe without psychotic features: Secondary | ICD-10-CM

## 2022-09-11 NOTE — Psych (Signed)
Virtual Visit via Video Note  I connected with Shirley Nunez on 09/09/22 at  9:00 AM EDT by a video enabled telemedicine application and verified that I am speaking with the correct person using two identifiers.  Location: Patient: patient home Provider: clinical home office   I discussed the limitations of evaluation and management by telemedicine and the availability of in person appointments. The patient expressed understanding and agreed to proceed.  I discussed the assessment and treatment plan with the patient. The patient was provided an opportunity to ask questions and all were answered. The patient agreed with the plan and demonstrated an understanding of the instructions.   The patient was advised to call back or seek an in-person evaluation if the symptoms worsen or if the condition fails to improve as anticipated.  Pt was provided 240 minutes of non-face-to-face time during this encounter.   Shirley Guiles, LCSW   [facility] PHP THERAPIST PROGRESS NOTE  Chrystina Naff Karges [id number]  Session Time: 9:00 - 10:00  Participation Level: Active  Behavioral Response: CasualAlertDepressed  Type of Therapy: Group Therapy  Treatment Goals addressed: Coping  Progress Towards Goals: Progressing  Interventions: CBT, DBT, Supportive, and Reframing  Summary: Shirley Nunez is a 47 y.o. female who presents with depression and trauma symptoms.  Clinician led check-in regarding current stressors and situation, and review of patient completed daily inventory. Clinician utilized active listening and empathetic response and validated patient emotions. Clinician facilitated processing group on pertinent issues.?    Therapist Response: Patient arrived within time allowed. Patient rates her mood at a 6 on a scale of 1-10 with 10 being best. Pt states she feels "kind of blah." Pt states she slept 4 hours and ate 1x. Pt reports she is feeling physically ill and is nauseous and achy. Pt states she  napped most of the afternoon. Pt reports low energy. Patient able to process. Patient engaged in discussion.            Session Time: 10:00 am - 11:00 am   Participation Level: Active   Behavioral Response: CasualAlertDepressed   Type of Therapy: Group Therapy   Treatment Goals addressed: Coping   Progress Towards Goals: Progressing   Interventions: CBT, DBT, Solution Focused, Strength-based, Supportive, and Reframing   Therapist Response: Cln led discussion on DBT dialectics and balance. Cln encouraged pt's to utilize AND statements to cue their brain to hold two opposing ideas. Cln provided examples and group members created their own AND statements.   Therapist Response: Pt engaged in discussion and created AND statements with group.         Session Time: 11:00 -12:00   Participation Level: Active   Behavioral Response: CasualAlertDepressed   Type of Therapy: Group Therapy   Treatment Goals addressed: Coping   Progress Towards Goals: Progressing   Interventions: CBT, DBT, Solution Focused, Strength-based, Supportive, and Reframing   Summary: Cln introduced topic of CBT cognitive distortions. Cln discussed unhealthy thought patterns and how our thoughts shape our reality and irrational thoughts can alter our perspective. Cln utilized handout "cognitive distortions" to discuss common examples of distorted thoughts and group members worked to identify examples in their own life.    Therapist Response: Pt engaged in discussion and is able to determine examples of distorted thinking in their own life.          Session Time: 12:00 -1:00   Participation Level: Active   Behavioral Response: CasualAlertDepressed   Type of Therapy: Group therapy, Occupational Therapy  Treatment Goals addressed: Coping   Progress Towards Goals: Progressing   Interventions: Supportive; Psychoeducation   Summary: 12:00 - 12:50: Occupational Therapy group led by cln E.  Hollan. 12:50 - 1:00 Clinician assessed for immediate needs, medication compliance and efficacy, and safety concerns.   Therapist Response: 12:00 - 12:50: See OT note 12:50 - 1:00 pm: At check-out, patient reports no immediate concerns. Patient demonstrates progress as evidenced by continued engagement and responsiveness to treatment. Patient denies SI/HI/self-harm thoughts at the end of group.      Suicidal/Homicidal: Nowithout intent/plan  Plan: Pt will continue in PHP while working to decrease trauma and depression symptoms, increase daily functioning, and increase ability to manage symptoms in a healthy manner.   Collaboration of Care: Medication Management AEB J. McQuilla  Patient/Guardian was advised Release of Information must be obtained prior to any record release in order to collaborate their care with an outside provider. Patient/Guardian was advised if they have not already done so to contact the registration department to sign all necessary forms in order for [place] to release information regarding their care.   Consent: Patient/Guardian gives verbal consent for treatment and assignment of benefits for services provided during this visit. Patient/Guardian expressed understanding and agreed to proceed.   Diagnosis: PTSD (post-traumatic stress disorder) [F43.10]    1. PTSD (post-traumatic stress disorder)   2. Severe episode of recurrent major depressive disorder, without psychotic features (HCC)      Shirley Guiles, LCSW

## 2022-09-11 NOTE — Psych (Signed)
Virtual Visit via Video Note  I connected with Shirley Nunez on 09/05/22 at  9:00 AM EDT by a video enabled telemedicine application and verified that I am speaking with the correct person using two identifiers.  Location: Patient: patient home Provider: clinical home office   I discussed the limitations of evaluation and management by telemedicine and the availability of in person appointments. The patient expressed understanding and agreed to proceed.  I discussed the assessment and treatment plan with the patient. The patient was provided an opportunity to ask questions and all were answered. The patient agreed with the plan and demonstrated an understanding of the instructions.   The patient was advised to call back or seek an in-person evaluation if the symptoms worsen or if the condition fails to improve as anticipated.  Pt was provided 240 minutes of non-face-to-face time during this encounter.   Donia Guiles, LCSW   [facility] PHP THERAPIST PROGRESS NOTE  Shirley Nunez [id number]  Session Time: 9:00 - 10:00  Participation Level: Active  Behavioral Response: CasualAlertDepressed  Type of Therapy: Group Therapy  Treatment Goals addressed: Coping  Progress Towards Goals: Progressing  Interventions: CBT, DBT, Supportive, and Reframing  Summary: Shirley Nunez is a 47 y.o. female who presents with depression and trauma symptoms.  Clinician led check-in regarding current stressors and situation, and review of patient completed daily inventory. Clinician utilized active listening and empathetic response and validated patient emotions. Clinician facilitated processing group on pertinent issues.?    Therapist Response: Patient arrived within time allowed. Patient rates her mood at a 2 on a scale of 1-10 with 10 being best. Pt states she feels "not in it" Pt states she slept 1.5 hours and ate 1x. Pt reports it has been a rough week emotionally and she feels unmotivated and   exposed. Pt states she was "unenthused to wake up" and denies and SI/HI. Pt brightens some as group continues. Pt struggles with black and white thinking. Patient able to process. Patient engaged in discussion.            Session Time: 10:00 am - 11:00 am   Participation Level: Active   Behavioral Response: CasualAlertDepressed   Type of Therapy: Group Therapy   Treatment Goals addressed: Coping   Progress Towards Goals: Progressing   Interventions: CBT, DBT, Solution Focused, Strength-based, Supportive, and Reframing   Therapist Response: Cln led discussion on how/when to share sensitive topics with others. Cln discussed the connection between trust and appropriate sharing and how to determine what is appropriate to share.    Therapist Response: Pt engaged in discussion and reports gaining insight.           Session Time: 11:00 -12:00   Participation Level: Active   Behavioral Response: CasualAlertDepressed   Type of Therapy: Group Therapy   Treatment Goals addressed: Coping   Progress Towards Goals: Progressing   Interventions: CBT, DBT, Solution Focused, Strength-based, Supportive, and Reframing   Summary: Cln led discussion on ways to manage stressors and feelings over the weekend. Group members  brainstormed things to do over the weekend for multiple levels of energy, access, and moods. Cln reviewed crisis services should they be needed and provided pt's with the text crisis line, mobile crisis, national suicide hotline, [facility] 24/7 line, and information on [facility].      Therapist Response:  Pt engaged in discussion and is able to identify 3 ideas of what to do over the weekend to keep their mind  engaged.          Session Time: 12:00 -1:00   Participation Level: Active   Behavioral Response: CasualAlertDepressed   Type of Therapy: Group therapy, Occupational Therapy   Treatment Goals addressed: Coping   Progress Towards Goals: Progressing    Interventions: Supportive; Psychoeducation   Summary: 12:00 - 12:50: Occupational Therapy group led by cln E. Hollan. 12:50 - 1:00 Clinician assessed for immediate needs, medication compliance and efficacy, and safety concerns.   Therapist Response: 12:00 - 12:50: See OT note 12:50 - 1:00 pm: At check-out, patient reports no immediate concerns. Patient demonstrates progress as evidenced by continued engagement and responsiveness to treatment. Patient denies SI/HI/self-harm thoughts at the end of group.      Suicidal/Homicidal: Nowithout intent/plan  Plan: Pt will continue in PHP while working to decrease trauma and depression symptoms, increase daily functioning, and increase ability to manage symptoms in a healthy manner.   Collaboration of Care: Medication Management AEB J. McQuilla  Patient/Guardian was advised Release of Information must be obtained prior to any record release in order to collaborate their care with an outside provider. Patient/Guardian was advised if they have not already done so to contact the registration department to sign all necessary forms in order for [place] to release information regarding their care.   Consent: Patient/Guardian gives verbal consent for treatment and assignment of benefits for services provided during this visit. Patient/Guardian expressed understanding and agreed to proceed.   Diagnosis: Severe episode of recurrent major depressive disorder, without psychotic features (HCC) [F33.2]    1. Severe episode of recurrent major depressive disorder, without psychotic features (HCC)   2. PTSD (post-traumatic stress disorder)      Donia Guiles, LCSW

## 2022-09-11 NOTE — Psych (Signed)
Virtual Visit via Video Note  I connected with Shirley Nunez on 09/08/22 at  9:00 AM EDT by a video enabled telemedicine application and verified that I am speaking with the correct person using two identifiers.  Location: Patient: patient home Provider: clinical home office   I discussed the limitations of evaluation and management by telemedicine and the availability of in person appointments. The patient expressed understanding and agreed to proceed.  I discussed the assessment and treatment plan with the patient. The patient was provided an opportunity to ask questions and all were answered. The patient agreed with the plan and demonstrated an understanding of the instructions.   The patient was advised to call back or seek an in-person evaluation if the symptoms worsen or if the condition fails to improve as anticipated.  Pt was provided 240 minutes of non-face-to-face time during this encounter.   Shirley Guiles, LCSW   [facility] PHP THERAPIST PROGRESS NOTE  Shirley Nunez [id number]  Session Time: 9:00 - 10:00  Participation Level: Active  Behavioral Response: CasualAlertDepressed  Type of Therapy: Group Therapy  Treatment Goals addressed: Coping  Progress Towards Goals: Progressing  Interventions: CBT, DBT, Supportive, and Reframing  Summary: Shirley Nunez is a 47 y.o. female who presents with depression and trauma symptoms.  Clinician led check-in regarding current stressors and situation, and review of patient completed daily inventory. Clinician utilized active listening and empathetic response and validated patient emotions. Clinician facilitated processing group on pertinent issues.?    Therapist Response: Patient arrived within time allowed. Patient rates her mood at a 9 on a scale of 1-10 with 10 being best. Pt states she feels "relieved." Pt states she slept 5 hours and ate 2x. Pt reports she told the man she's dating about being in group and having MH issues and  he was supportive. Pt states feeling tentative but happy that he did not judge her. Pt states she also spent time with her daughter which went well. Pt states she is purposefully not speaking to her ex-husband to protect her peace and has noticed improved mood.  Patient able to process. Patient engaged in discussion.            Session Time: 10:00 am - 11:00 am   Participation Level: Active   Behavioral Response: CasualAlertDepressed   Type of Therapy: Group Therapy   Treatment Goals addressed: Coping   Progress Towards Goals: Progressing   Interventions: CBT, DBT, Solution Focused, Strength-based, Supportive, and Reframing   Therapist Response: Cln led processing group for pt's current struggles. Group members shared stressors and provided support and feedback. Cln brought in topics of boundaries, healthy relationships, and unhealthy thought processes to inform discussion.    Therapist Response: Pt able to process and provide support to group.          Session Time: 11:00 -12:00   Participation Level: Active   Behavioral Response: CasualAlertDepressed   Type of Therapy: Group Therapy   Treatment Goals addressed: Coping   Progress Towards Goals: Progressing   Interventions: CBT, DBT, Solution Focused, Strength-based, Supportive, and Reframing   Summary: Cln introduced CBT and the way in which it can provide context for addressing stumbling blocks. Group discussed "the problem is not the problem, the problem is how we're thinking about the problem" and tried to change perspective on current struggles.    Therapist Response: Pt engaged in discussion and is able to attempt reframing using CBT.  Session Time: 12:00 -1:00   Participation Level: Active   Behavioral Response: CasualAlertDepressed   Type of Therapy: Group therapy, Occupational Therapy   Treatment Goals addressed: Coping   Progress Towards Goals: Progressing   Interventions: Supportive;  Psychoeducation   Summary: 12:00 - 12:50: Occupational Therapy group led by cln E. Hollan. 12:50 - 1:00 Clinician assessed for immediate needs, medication compliance and efficacy, and safety concerns.   Therapist Response: 12:00 - 12:50: See OT note 12:50 - 1:00 pm: At check-out, patient reports no immediate concerns. Patient demonstrates progress as evidenced by continued engagement and responsiveness to treatment. Patient denies SI/HI/self-harm thoughts at the end of group.      Suicidal/Homicidal: Nowithout intent/plan  Plan: Pt will continue in PHP while working to decrease trauma and depression symptoms, increase daily functioning, and increase ability to manage symptoms in a healthy manner.   Collaboration of Care: Medication Management AEB J. McQuilla  Patient/Guardian was advised Release of Information must be obtained prior to any record release in order to collaborate their care with an outside provider. Patient/Guardian was advised if they have not already done so to contact the registration department to sign all necessary forms in order for [place] to release information regarding their care.   Consent: Patient/Guardian gives verbal consent for treatment and assignment of benefits for services provided during this visit. Patient/Guardian expressed understanding and agreed to proceed.   Diagnosis: Severe episode of recurrent major depressive disorder, without psychotic features (HCC) [F33.2]    1. Severe episode of recurrent major depressive disorder, without psychotic features (HCC)   2. PTSD (post-traumatic stress disorder)      Shirley Guiles, LCSW

## 2022-09-11 NOTE — Psych (Signed)
Virtual Visit via Video Note  I connected with Tiasha Z Hansen on 09/11/22 at  9:00 AM EDT by a video enabled telemedicine application and verified that I am speaking with the correct person using two identifiers.  Location: Patient: pt's home in [place], [place] Provider: clinical home office in [place], [place]   I discussed the limitations of evaluation and management by telemedicine and the availability of in person appointments. The patient expressed understanding and agreed to proceed.   I discussed the assessment and treatment plan with the patient. The patient was provided an opportunity to ask questions and all were answered. The patient agreed with the plan and demonstrated an understanding of the instructions.   The patient was advised to call back or seek an in-person evaluation if the symptoms worsen or if the condition fails to improve as anticipated.  I provided 60 minutes of non-face-to-face time during this encounter.   Wyvonnia Lora, LCSW   [facility] BH PHP THERAPIST PROGRESS NOTE  Camrie Stock Plagge [id number]   Session Time: 9:00 am - 10:00 am  Participation Level: Active  Behavioral Response: DisheveledAlertAnxious and Depressed  Type of Therapy: Group Therapy  Treatment Goals addressed: Coping  Progress Towards Goals: Progressing  Interventions: CBT, DBT, Solution Focused, Strength-based, Supportive, and Reframing  Therapist Response: Clinician led check-in regarding current stressors and situation, and review of patient completed daily inventory. Clinician utilized active listening and empathetic response and validated patient emotions. Clinician facilitated processing group on pertinent issues.?   Summary: Patient arrived within time allowed. Patient rates her mood at a 3 or 4 on a scale of 1-10 with 10 being best. Pt reported, She reports she cleaned yesterday but did not fold all of her laundry. "I really did want to accomplish that goal but I talked myself out  of it and I'm a little disappointed in myself." She states she slept about four hours last night and her appetite has been decreasing, only eating eggs and spinach late in the evening. She states she hates protein shakes and they mess with her stomach. Cln advised pt to talk to MD about appetite. Denies SI/SH thoughts. Pt able to process.?Pt engaged in discussion.?She attributes her failure to complete laundry to the medicine because she didn't "give a shit." She states she believes this is caused by medication changes that were made two days ago. She is not receptive to feedback regarding psychoeducation about medication and declines to discuss further. She becomes tearful and states she needs a break and turns off her camera. Pt left group at 10 am and did not return.   Suicidal/Homicidal: Nowithout intent/plan  Plan: ?Pt will continue in PHP and medication management while continuing to work on decreasing depression symptoms,?SI, and anxiety symptoms,?and increasing the ability to self manage symptoms.   Collaboration of Care: Medication Management AEB Dr. Morrie Sheldon  Patient/Guardian was advised Release of Information must be obtained prior to any record release in order to collaborate their care with an outside provider. Patient/Guardian was advised if they have not already done so to contact the registration department to sign all necessary forms in order for [place] to release information regarding their care.   Consent: Patient/Guardian gives verbal consent for treatment and assignment of benefits for services provided during this visit. Patient/Guardian expressed understanding and agreed to proceed.   Diagnosis: PTSD (post-traumatic stress disorder) [F43.10]    1. PTSD (post-traumatic stress disorder)   2. Severe episode of recurrent major depressive disorder, without psychotic features (HCC)  Wyvonnia Lora, LCSW 09/11/2022

## 2022-09-11 NOTE — Psych (Signed)
Virtual Visit via Video Note  I connected with Shirley Nunez on 09/03/22 at  9:00 AM EDT by a video enabled telemedicine application and verified that I am speaking with the correct person using two identifiers.  Location: Patient: patient home Provider: clinical home office   I discussed the limitations of evaluation and management by telemedicine and the availability of in person appointments. The patient expressed understanding and agreed to proceed.  I discussed the assessment and treatment plan with the patient. The patient was provided an opportunity to ask questions and all were answered. The patient agreed with the plan and demonstrated an understanding of the instructions.   The patient was advised to call back or seek an in-person evaluation if the symptoms worsen or if the condition fails to improve as anticipated.  Pt was provided 240 minutes of non-face-to-face time during this encounter.   Donia Guiles, LCSW   [facility] BH PHP THERAPIST PROGRESS NOTE  Shirley Nunez [id number]  Session Time: 9:00 - 10:00  Participation Level: Active  Behavioral Response: CasualAlertDepressed  Type of Therapy: Group Therapy  Treatment Goals addressed: Coping  Progress Towards Goals: Progressing  Interventions: CBT, DBT, Supportive, and Reframing  Summary: Shirley Nunez is a 47 y.o. female who presents with depression and trauma symptoms.  Clinician led check-in regarding current stressors and situation, and review of patient completed daily inventory. Clinician utilized active listening and empathetic response and validated patient emotions. Clinician facilitated processing group on pertinent issues.?    Therapist Response: Patient arrived within time allowed. Patient rates her mood at a 8 on a scale of 1-10 with 10 being best. Pt states she feels "excellent." Pt states she slept 5 hours and ate 1x. Pt reports found out her leave was approved and that a lingering issue with her  ex-husband was resolved which has bolstered her mood. Pt states feeling "lighter" yesterday after sharing with group her trauma reaction. Pt struggles with mood dependent thinking.  Patient able to process. Patient engaged in discussion.            Session Time: 10:00 am - 11:00 am   Participation Level: Active   Behavioral Response: CasualAlertDepressed   Type of Therapy: Group Therapy   Treatment Goals addressed: Coping   Progress Towards Goals: Progressing   Interventions: CBT, DBT, Solution Focused, Strength-based, Supportive, and Reframing   Therapist Response: Cln led processing group for pt's current struggles. Group members shared stressors and provided support and feedback. Cln brought in topics of boundaries, healthy relationships, and unhealthy thought processes to inform discussion.    Therapist Response: Pt able to process and provide support to group.            Session Time: 11:00 -12:00   Participation Level: Active   Behavioral Response: CasualAlertDepressed   Type of Therapy: Group Therapy, Spiritual Care   Treatment Goals addressed: Coping   Progress Towards Goals: Progressing   Interventions: Supportive, Education   Summary:  Shirley Nunez, Chaplain, led group.   Therapist Response: Pt participated         Session Time: 12:00 -1:00   Participation Level: Active   Behavioral Response: CasualAlertDepressed   Type of Therapy: Group therapy, Occupational Therapy   Treatment Goals addressed: Coping   Progress Towards Goals: Progressing   Interventions: Supportive; Psychoeducation   Summary: 12:00 - 12:50: Occupational Therapy group led by cln E. Hollan. 12:50 - 1:00 Clinician assessed for immediate needs, medication compliance and efficacy, and safety concerns.  Therapist Response: 12:00 - 12:50: See OT note 12:50 - 1:00 pm: At check-out, patient reports no immediate concerns. Patient demonstrates progress as evidenced by continued  engagement and responsiveness to treatment. Patient denies SI/HI/self-harm thoughts at the end of group.      Suicidal/Homicidal: Nowithout intent/plan  Plan: Pt will continue in PHP while working to decrease trauma and depression symptoms, increase daily functioning, and increase ability to manage symptoms in a healthy manner.   Collaboration of Care: Medication Management AEB J. McQuilla  Patient/Guardian was advised Release of Information must be obtained prior to any record release in order to collaborate their care with an outside provider. Patient/Guardian was advised if they have not already done so to contact the registration department to sign all necessary forms in order for [place] to release information regarding their care.   Consent: Patient/Guardian gives verbal consent for treatment and assignment of benefits for services provided during this visit. Patient/Guardian expressed understanding and agreed to proceed.   Diagnosis: Severe episode of recurrent major depressive disorder, without psychotic features (HCC) [F33.2]    1. Severe episode of recurrent major depressive disorder, without psychotic features (HCC)   2. PTSD (post-traumatic stress disorder)      Donia Guiles, LCSW

## 2022-09-11 NOTE — Therapy (Signed)
[facility] PARTIAL HOSPITALIZATION PROGRAM [address] [place], [place], [address] Phone: [phone]   Fax:  [phone]  Occupational Therapy Treatment Virtual Visit via Video Note  I connected with Shirley Nunez on 09/11/22 at  8:00 AM EDT by a video enabled telemedicine application and verified that I am speaking with the correct person using two identifiers.  Location: Patient: home Provider: office   I discussed the limitations of evaluation and management by telemedicine and the availability of in person appointments. The patient expressed understanding and agreed to proceed.    The patient was advised to call back or seek an in-person evaluation if the symptoms worsen or if the condition fails to improve as anticipated.  I provided 55 minutes of non-face-to-face time during this encounter.   Patient Details  Name: Shirley Nunez MRN: [id number] Date of Birth: Mar 23, 1976 No data recorded  Encounter Date: 09/10/2022   OT End of Session - 09/11/22 0933     Visit Number 10    Number of Visits 20    Date for OT Re-Evaluation 09/26/22    OT Start Time 1200    OT Stop Time 1255    OT Time Calculation (min) 55 min             Past Medical History:  Diagnosis Date   Anxiety    Depression    HSV infection    on daily medication   Migraine    Sickle cell anemia (HCC)    Sickle cell trait    Past Surgical History:  Procedure Laterality Date   ENDOMETRIAL ABLATION     WISDOM TOOTH EXTRACTION      There were no vitals filed for this visit.   Subjective Assessment - 09/11/22 0933     Currently in Pain? No/denies    Pain Score 0-No pain               Group Session:  S: Doing a bit better I think.  O: The objective of this presentation is to provide a comprehensive understanding of the concept of "motivation" and its role in human behavior and well-being. The content covers various theories of motivation, including  intrinsic and extrinsic motivators, and explores the psychological mechanisms that drive individuals to achieve goals, overcome obstacles, and make decisions. By diving into real-world applications, the presentation aims to offer actionable strategies for enhancing motivation in different life domains, such as work, relationships, and personal growth. Utilizing a multi-disciplinary approach, this presentation integrates insights from psychology, neuroscience, and behavioral economics to present a holistic view of motivation. The objective is not only to educate the audience about the complexities and driving forces behind motivation but also to equip them with practical tools and techniques to improve their own motivation levels. By the end of the presentation, attendees should have a well-rounded understanding of what motivates human actions and how to harness this knowledge for personal and professional betterment.   A:  The patient demonstrates a high level of engagement during the session, actively participating in discussions about motivation theories and their applicability to their own life. They show keen interest in learning new strategies to improve their motivation and even offer examples from their own experiences that align with the theories presented. Their level of self-awareness and willingness to invest in self-improvement suggest that they are well-positioned to benefit from the practical tools and techniques discussed. The patient's ability to articulate their goals and challenges further supports the likelihood  of successfully implementing the strategies presented.    P: Continue to attend PHP OT group sessions 5x week for 4 weeks to promote daily structure, social engagement, and opportunities to develop and utilize adaptive strategies to maximize functional performance in preparation for safe transition and integration back into school, work, and the community. Plan to address topic of  tbd in next OT group session.                    OT Education - 09/11/22 0933     Education Details Motivation              OT Short Term Goals - 08/27/22 2059       OT SHORT TERM GOAL #1   Title Client will independently seek out and participate in a community or online support group to reinforce coping strategies by time of discharge.    Time 4    Period Weeks    Status On-going    Target Date 09/26/22      OT SHORT TERM GOAL #2   Title By discharge, client will demonstrate the ability to adapt and modify routines when faced with unexpected events or changes, maintaining a balanced approach to daily tasks.      OT SHORT TERM GOAL #3   Title By the time of discharge, client will independently set, track, and make progress towards a long-term goal, demonstrating resilience in overcoming obstacles and seeking support when needed.                      Plan - 09/11/22 0934     Psychosocial Skills Coping Strategies;Habits;Interpersonal Interaction;Routines and Behaviors             Patient will benefit from skilled therapeutic intervention in order to improve the following deficits and impairments:       Psychosocial Skills: Coping Strategies, Habits, Interpersonal Interaction, Routines and Behaviors   Visit Diagnosis: Difficulty coping    Problem List Patient Active Problem List   Diagnosis Date Noted   Major depressive disorder, recurrent episode, severe (HCC) 08/21/2022   PTSD (post-traumatic stress disorder) 08/21/2022   Pain in left hip 10/11/2020   Depression, major, single episode, mild (HCC) 05/04/2018   Insomnia 05/04/2018   Migraine 07/16/2017   Joint stiffness 11/09/2015   Herpes simplex of female genitalia 08/16/2014   Essential hypertension 07/12/2014   Sickle cell trait (HCC) 07/12/2014    [name], OT 09/11/2022, 9:34 AM [name], OT  [facility] HOSPITALIZATION  PROGRAM [address] [place], [place], [address] Phone: [phone]   Fax:  [phone]  Name: Shirley Nunez MRN: [id number] Date of Birth: 1975/09/16

## 2022-09-11 NOTE — Psych (Signed)
Virtual Visit via Video Note  I connected with Shirley Nunez on 09/04/22 at  9:00 AM EDT by a video enabled telemedicine application and verified that I am speaking with the correct person using two identifiers.  Location: Patient: patient home Provider: clinical home office   I discussed the limitations of evaluation and management by telemedicine and the availability of in person appointments. The patient expressed understanding and agreed to proceed.  I discussed the assessment and treatment plan with the patient. The patient was provided an opportunity to ask questions and all were answered. The patient agreed with the plan and demonstrated an understanding of the instructions.   The patient was advised to call back or seek an in-person evaluation if the symptoms worsen or if the condition fails to improve as anticipated.  Pt was provided 240 minutes of non-face-to-face time during this encounter.   Donia Guiles, LCSW   [facility] BH PHP THERAPIST PROGRESS NOTE  Shirley Nunez [id number]  Session Time: 9:00 - 10:00  Participation Level: Active  Behavioral Response: CasualAlertDepressed  Type of Therapy: Group Therapy  Treatment Goals addressed: Coping  Progress Towards Goals: Progressing  Interventions: CBT, DBT, Supportive, and Reframing  Summary: Shirley Nunez is a 47 y.o. female who presents with depression and trauma symptoms.  Clinician led check-in regarding current stressors and situation, and review of patient completed daily inventory. Clinician utilized active listening and empathetic response and validated patient emotions. Clinician facilitated processing group on pertinent issues.?    Therapist Response: Patient arrived within time allowed. Patient rates her mood at a 3 on a scale of 1-10 with 10 being best. Pt states she feels "anxious, irritated." Pt states she slept 5 hours and ate 1x. Pt reports stress re: a conversation with her ex-husband/co-parent this  morning. Pt states she does not feel good after speaking with him and feels herself reverting emotionally. Pt shares her daughter began her period which made pt emotional. Pt continues to struggle with mood dependent thinking. Patient able to process. Patient engaged in discussion.            Session Time: 10:00 am - 11:00 am   Participation Level: Active   Behavioral Response: CasualAlertDepressed   Type of Therapy: Group Therapy   Treatment Goals addressed: Coping   Progress Towards Goals: Progressing   Interventions: CBT, DBT, Solution Focused, Strength-based, Supportive, and Reframing   Therapist Response: Cln led discussion on HALT (hungry, angry, lonely, tired) acronym, which encourages check-in with ourselves when feeling emotional. Cln encouraged pt's to use HALT as a first check-in step to address basic vulnerabilities before reacting. Group members discussed ways in which they react to being angry, lonely, and tired, and work to determine times in which utilizing HALT would have diverted a bigger issue.      Therapist Response:  Pt engaged in discussion and reports understanding of HALT and willingness to utilize it.           Session Time: 11:00 -12:00   Participation Level: Active   Behavioral Response: CasualAlertDepressed   Type of Therapy: Group Therapy   Treatment Goals addressed: Coping   Progress Towards Goals: Progressing   Interventions: CBT, DBT, Solution Focused, Strength-based, Supportive, and Reframing   Summary: Cln continued topic of DBT distress tolerance skills. Cln introduced TIPP skills to use during cases of extreme emotion. Group practiced deep breathing and progressive muscle relaxation together. Group discussed which situations they can apply TIPP skills.    Therapist Response:  Pt engaged in discussion and reports understanding of how to apply skills.          Session Time: 12:00 -1:00   Participation Level: Active   Behavioral  Response: CasualAlertDepressed   Type of Therapy: Group therapy, Occupational Therapy   Treatment Goals addressed: Coping   Progress Towards Goals: Progressing   Interventions: Supportive; Psychoeducation   Summary: 12:00 - 12:50: Occupational Therapy group led by cln E. Hollan. 12:50 - 1:00 Clinician assessed for immediate needs, medication compliance and efficacy, and safety concerns.   Therapist Response: 12:00 - 12:50: See OT note 12:50 - 1:00 pm: At check-out, patient reports no immediate concerns. Patient demonstrates progress as evidenced by continued engagement and responsiveness to treatment. Patient denies SI/HI/self-harm thoughts at the end of group.      Suicidal/Homicidal: Nowithout intent/plan  Plan: Pt will continue in PHP while working to decrease trauma and depression symptoms, increase daily functioning, and increase ability to manage symptoms in a healthy manner.   Collaboration of Care: Medication Management AEB J. McQuilla  Patient/Guardian was advised Release of Information must be obtained prior to any record release in order to collaborate their care with an outside provider. Patient/Guardian was advised if they have not already done so to contact the registration department to sign all necessary forms in order for [place] to release information regarding their care.   Consent: Patient/Guardian gives verbal consent for treatment and assignment of benefits for services provided during this visit. Patient/Guardian expressed understanding and agreed to proceed.   Diagnosis: Severe episode of recurrent major depressive disorder, without psychotic features (HCC) [F33.2]    1. Severe episode of recurrent major depressive disorder, without psychotic features (HCC)   2. PTSD (post-traumatic stress disorder)      Donia Guiles, LCSW

## 2022-09-11 NOTE — Psych (Signed)
Virtual Visit via Video Note  I connected with Shirley Nunez on 09/10/22 at  9:00 AM EDT by a video enabled telemedicine application and verified that I am speaking with the correct person using two identifiers.  Location: Patient: patient home Provider: clinical home office   I discussed the limitations of evaluation and management by telemedicine and the availability of in person appointments. The patient expressed understanding and agreed to proceed.  I discussed the assessment and treatment plan with the patient. The patient was provided an opportunity to ask questions and all were answered. The patient agreed with the plan and demonstrated an understanding of the instructions.   The patient was advised to call back or seek an in-person evaluation if the symptoms worsen or if the condition fails to improve as anticipated.  Pt was provided 240 minutes of non-face-to-face time during this encounter.   Donia Guiles, LCSW   [facility] BH PHP THERAPIST PROGRESS NOTE  Shirley Nunez [id number]  Session Time: 9:00 - 10:00  Participation Level: Active  Behavioral Response: CasualAlertDepressed  Type of Therapy: Group Therapy  Treatment Goals addressed: Coping  Progress Towards Goals: Progressing  Interventions: CBT, DBT, Supportive, and Reframing  Summary: Raechal Raben Kinsella is a 47 y.o. female who presents with depression and trauma symptoms.  Clinician led check-in regarding current stressors and situation, and review of patient completed daily inventory. Clinician utilized active listening and empathetic response and validated patient emotions. Clinician facilitated processing group on pertinent issues.?    Therapist Response: Patient arrived within time allowed. Patient rates her mood at a 4 on a scale of 1-10 with 10 being best. Pt states she feels "sleepy." Pt states she slept 4.5 hours and ate 1x. Pt reports she continues to feel drowsy and tired throughout the day. Pt reports  struggling with doing chores.  Patient able to process. Patient engaged in discussion.            Session Time: 10:00 am - 11:00 am   Participation Level: Active   Behavioral Response: CasualAlertDepressed   Type of Therapy: Group Therapy   Treatment Goals addressed: Coping   Progress Towards Goals: Progressing   Interventions: CBT, DBT, Solution Focused, Strength-based, Supportive, and Reframing   Therapist Response: Cln led processing group for pt's current struggles. Group members shared stressors and provided support and feedback. Cln brought in topics of boundaries, healthy relationships, and unhealthy thought processes to inform discussion.    Therapist Response: Pt able to process and provide support to group.            Session Time: 11:00 -12:00   Participation Level: Active   Behavioral Response: CasualAlertDepressed   Type of Therapy: Group Therapy, Spiritual Care   Treatment Goals addressed: Coping   Progress Towards Goals: Progressing   Interventions: Supportive, Education   Summary:  [name], Chaplain, led group.   Therapist Response: Pt participated         Session Time: 12:00 -1:00   Participation Level: Active   Behavioral Response: CasualAlertDepressed   Type of Therapy: Group therapy, Occupational Therapy   Treatment Goals addressed: Coping   Progress Towards Goals: Progressing   Interventions: Supportive; Psychoeducation   Summary: 12:00 - 12:50: Occupational Therapy group led by cln E. Hollan. 12:50 - 1:00 Clinician assessed for immediate needs, medication compliance and efficacy, and safety concerns.   Therapist Response: 12:00 - 12:50: See OT note 12:50 - 1:00 pm: At check-out, patient reports no immediate concerns. Patient demonstrates progress as  evidenced by continued engagement and responsiveness to treatment. Patient denies SI/HI/self-harm thoughts at the end of group.      Suicidal/Homicidal: Nowithout  intent/plan  Plan: Pt will continue in PHP while working to decrease trauma and depression symptoms, increase daily functioning, and increase ability to manage symptoms in a healthy manner.   Collaboration of Care: Medication Management AEB J. McQuilla  Patient/Guardian was advised Release of Information must be obtained prior to any record release in order to collaborate their care with an outside provider. Patient/Guardian was advised if they have not already done so to contact the registration department to sign all necessary forms in order for [place] to release information regarding their care.   Consent: Patient/Guardian gives verbal consent for treatment and assignment of benefits for services provided during this visit. Patient/Guardian expressed understanding and agreed to proceed.   Diagnosis: PTSD (post-traumatic stress disorder) [F43.10]    1. PTSD (post-traumatic stress disorder)   2. Severe episode of recurrent major depressive disorder, without psychotic features (HCC)      Donia Guiles, LCSW
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
## 2022-09-12 ENCOUNTER — Other Ambulatory Visit (HOSPITAL_COMMUNITY): Payer: BC Managed Care – PPO | Admitting: Licensed Clinical Social Worker

## 2022-09-12 ENCOUNTER — Other Ambulatory Visit (HOSPITAL_COMMUNITY): Payer: BC Managed Care – PPO

## 2022-09-12 DIAGNOSIS — D573 Sickle-cell trait: Secondary | ICD-10-CM | POA: Diagnosis not present

## 2022-09-12 DIAGNOSIS — F411 Generalized anxiety disorder: Secondary | ICD-10-CM | POA: Diagnosis not present

## 2022-09-12 DIAGNOSIS — Z79624 Long term (current) use of inhibitors of nucleotide synthesis: Secondary | ICD-10-CM | POA: Diagnosis not present

## 2022-09-12 DIAGNOSIS — F332 Major depressive disorder, recurrent severe without psychotic features: Secondary | ICD-10-CM | POA: Diagnosis not present

## 2022-09-12 DIAGNOSIS — F4310 Post-traumatic stress disorder, unspecified: Secondary | ICD-10-CM

## 2022-09-12 DIAGNOSIS — R4589 Other symptoms and signs involving emotional state: Secondary | ICD-10-CM

## 2022-09-12 DIAGNOSIS — Z79899 Other long term (current) drug therapy: Secondary | ICD-10-CM | POA: Diagnosis not present

## 2022-09-12 DIAGNOSIS — Z5986 Financial insecurity: Secondary | ICD-10-CM | POA: Diagnosis not present

## 2022-09-12 NOTE — Progress Notes (Unsigned)
Spoke with patient via WebEx video call, used 2 identifiers to correctly identify patient. States that groups are good. Does not like the increase in Zoloft to 50mg , it causes her to have no emotions at all and is not agreeing with her. Very nervous about meds. Discussed genetic testing and would like to have it done. Message sent to MD. Not eating very well at all. Eating once daily and not a healthy meal, mostly a snack or fruit. Sleeping during the day since increase in Zoloft. On scale 1-10 as 10 being worst she rates depression at 7. Anxiety at 10. Denies SI/HI or AV hallucinations.

## 2022-09-15 ENCOUNTER — Other Ambulatory Visit (HOSPITAL_COMMUNITY): Payer: BC Managed Care – PPO | Admitting: Licensed Clinical Social Worker

## 2022-09-15 ENCOUNTER — Other Ambulatory Visit (HOSPITAL_COMMUNITY): Payer: BC Managed Care – PPO

## 2022-09-15 ENCOUNTER — Encounter (HOSPITAL_COMMUNITY): Payer: Self-pay

## 2022-09-15 DIAGNOSIS — F332 Major depressive disorder, recurrent severe without psychotic features: Secondary | ICD-10-CM

## 2022-09-15 DIAGNOSIS — Z79624 Long term (current) use of inhibitors of nucleotide synthesis: Secondary | ICD-10-CM | POA: Diagnosis not present

## 2022-09-15 DIAGNOSIS — F411 Generalized anxiety disorder: Secondary | ICD-10-CM | POA: Diagnosis not present

## 2022-09-15 DIAGNOSIS — F4310 Post-traumatic stress disorder, unspecified: Secondary | ICD-10-CM | POA: Diagnosis not present

## 2022-09-15 DIAGNOSIS — Z5986 Financial insecurity: Secondary | ICD-10-CM | POA: Diagnosis not present

## 2022-09-15 DIAGNOSIS — Z79899 Other long term (current) drug therapy: Secondary | ICD-10-CM | POA: Diagnosis not present

## 2022-09-15 DIAGNOSIS — R4589 Other symptoms and signs involving emotional state: Secondary | ICD-10-CM

## 2022-09-15 DIAGNOSIS — D573 Sickle-cell trait: Secondary | ICD-10-CM | POA: Diagnosis not present

## 2022-09-15 NOTE — Progress Notes (Unsigned)
Spoke with patient via WebEx video call, used 2 identifiers to correctly identify patient. States that groups are going well. She will see the psychiatrist today to discuss her medications. She has been anxious and not sleeping. Has a lot of anxiety surrounding her work and money. Has not had a paycheck since the 12th of April. Worrying about her bills and rent. Her short term disability was extended till June but her employer did not have that information yet and it was causing her stress. On scale 1-10 as 10 being worst she rates depression at 6 and anxiety at 7/8. Denies SI/HI or AV hallucinations. No issues or complaints.

## 2022-09-15 NOTE — Therapy (Signed)
[facility] PARTIAL HOSPITALIZATION PROGRAM [address] [place], [place], [address] Phone: [phone]   Fax:  [phone]  Occupational Therapy Treatment Virtual Visit via Video Note  I connected with Shirley Nunez on 09/15/22 at  8:00 AM EDT by a video enabled telemedicine application and verified that I am speaking with the correct person using two identifiers.  Location: Patient: home Provider: office   I discussed the limitations of evaluation and management by telemedicine and the availability of in person appointments. The patient expressed understanding and agreed to proceed.    The patient was advised to call back or seek an in-person evaluation if the symptoms worsen or if the condition fails to improve as anticipated.  I provided 55 minutes of non-face-to-face time during this encounter.   Patient Details  Name: Shirley Nunez MRN: [id number] Date of Birth: [birth year] No data recorded  Encounter Date: 09/12/2022   OT End of Session - 09/15/22 1644     Visit Number 11    Number of Visits 20    Date for OT Re-Evaluation 09/26/22    OT Start Time 1200    OT Stop Time 1255    OT Time Calculation (min) 55 min             Past Medical History:  Diagnosis Date   Anxiety    Depression    HSV infection    on daily medication   Migraine    Sickle cell anemia (HCC)    Sickle cell trait    Past Surgical History:  Procedure Laterality Date   ENDOMETRIAL ABLATION     WISDOM TOOTH EXTRACTION      There were no vitals filed for this visit.   Subjective Assessment - 09/15/22 1644     Currently in Pain? No/denies    Pain Score 0-No pain                   Group Session:  S: Feeling better today.   O: The session centered on the neuroscientific underpinnings of motivation and its inverse relationship with procrastination. Through a review of current scientific literature, the group examined:  The function of dopamine  as a critical neurotransmitter in the motivation circuitry of the brain. The physiological and psychological aspects of procrastination, including the impact of dopamine on the tendency to delay tasks. Strategies for leveraging an understanding of dopamine's role to enhance motivation, address procrastination, and foster productive habits. The objective was to provide a framework for participants to understand the neurochemical dynamics of motivation, relate these to personal experiences of procrastination, and apply evidence-based methods to improve focus and drive in both professional and personal contexts.   A:  The patient demonstrates a strong grasp of the material presented on the neurobiology of motivation and its implications for procrastination. They actively participated in discussions, showing an ability to connect scientific concepts to personal experiences. The patient exhibits insight into their own behavior patterns and expresses a keen interest in applying the strategies discussed to mitigate tendencies towards procrastination. The patient's engagement level indicates a readiness to implement behavioral changes that may enhance personal productivity and reduce procrastination.   P: Continue to attend PHP OT group sessions 5x week for 4 weeks to promote daily structure, social engagement, and opportunities to develop and utilize adaptive strategies to maximize functional performance in preparation for safe transition and integration back into school, work, and the community. Plan to address topic of pt 2  in next OT group session.                OT Education - 09/15/22 1644     Education Details Procrastination              OT Short Term Goals - 08/27/22 2059       OT SHORT TERM GOAL #1   Title Client will independently seek out and participate in a community or online support group to reinforce coping strategies by time of discharge.    Time 4    Period Weeks     Status On-going    Target Date 09/26/22      OT SHORT TERM GOAL #2   Title By discharge, client will demonstrate the ability to adapt and modify routines when faced with unexpected events or changes, maintaining a balanced approach to daily tasks.      OT SHORT TERM GOAL #3   Title By the time of discharge, client will independently set, track, and make progress towards a long-term goal, demonstrating resilience in overcoming obstacles and seeking support when needed.                      Plan - 09/15/22 1644     Psychosocial Skills Coping Strategies;Habits;Interpersonal Interaction;Routines and Behaviors             Patient will benefit from skilled therapeutic intervention in order to improve the following deficits and impairments:       Psychosocial Skills: Coping Strategies, Habits, Interpersonal Interaction, Routines and Behaviors   Visit Diagnosis: Difficulty coping    Problem List Patient Active Problem List   Diagnosis Date Noted   Major depressive disorder, recurrent episode, severe (HCC) 08/21/2022   PTSD (post-traumatic stress disorder) 08/21/2022   Pain in left hip 10/11/2020   Depression, major, single episode, mild (HCC) 05/04/2018   Insomnia 05/04/2018   Migraine 07/16/2017   Joint stiffness 11/09/2015   Herpes simplex of female genitalia 08/16/2014   Essential hypertension 07/12/2014   Sickle cell trait (HCC) 07/12/2014    [name], OT 09/15/2022, 4:45 PM  [name], OT   [facility] HOSPITALIZATION PROGRAM [address] [place], [place], [address] Phone: [phone]   Fax:  [phone]  Name: Shirley Nunez MRN: [id number] Date of Birth: Sep 05, 1975

## 2022-09-16 ENCOUNTER — Encounter (HOSPITAL_COMMUNITY): Payer: Self-pay

## 2022-09-16 ENCOUNTER — Telehealth (HOSPITAL_COMMUNITY): Payer: Self-pay | Admitting: Psychiatry

## 2022-09-16 ENCOUNTER — Other Ambulatory Visit (HOSPITAL_COMMUNITY): Payer: BC Managed Care – PPO | Admitting: Licensed Clinical Social Worker

## 2022-09-16 ENCOUNTER — Other Ambulatory Visit (HOSPITAL_COMMUNITY): Payer: BC Managed Care – PPO

## 2022-09-16 DIAGNOSIS — Z5986 Financial insecurity: Secondary | ICD-10-CM | POA: Diagnosis not present

## 2022-09-16 DIAGNOSIS — R4589 Other symptoms and signs involving emotional state: Secondary | ICD-10-CM

## 2022-09-16 DIAGNOSIS — F4310 Post-traumatic stress disorder, unspecified: Secondary | ICD-10-CM

## 2022-09-16 DIAGNOSIS — D573 Sickle-cell trait: Secondary | ICD-10-CM | POA: Diagnosis not present

## 2022-09-16 DIAGNOSIS — F411 Generalized anxiety disorder: Secondary | ICD-10-CM | POA: Diagnosis not present

## 2022-09-16 DIAGNOSIS — F332 Major depressive disorder, recurrent severe without psychotic features: Secondary | ICD-10-CM | POA: Diagnosis not present

## 2022-09-16 DIAGNOSIS — Z79899 Other long term (current) drug therapy: Secondary | ICD-10-CM | POA: Diagnosis not present

## 2022-09-16 DIAGNOSIS — Z79624 Long term (current) use of inhibitors of nucleotide synthesis: Secondary | ICD-10-CM | POA: Diagnosis not present

## 2022-09-16 MED ORDER — MIRTAZAPINE 7.5 MG PO TABS: 7.5000 mg | ORAL_TABLET | Freq: Every day | ORAL | 0 refills | Status: DC

## 2022-09-16 NOTE — Therapy (Signed)
[facility] PARTIAL HOSPITALIZATION PROGRAM [address] [place], [place], [address] Phone: [phone]   Fax:  [phone]  Occupational Therapy Treatment Virtual Visit via Video Note  I connected with Seanne Fetters Dripps on 09/16/22 at  8:00 AM EDT by a video enabled telemedicine application and verified that I am speaking with the correct person using two identifiers.  Location: Patient: home Provider: office   I discussed the limitations of evaluation and management by telemedicine and the availability of in person appointments. The patient expressed understanding and agreed to proceed.    The patient was advised to call back or seek an in-person evaluation if the symptoms worsen or if the condition fails to improve as anticipated.  I provided 55 minutes of non-face-to-face time during this encounter.   Patient Details  Name: Shirley Nunez MRN: [id number] Date of Birth: 08/10/1975 No data recorded  Encounter Date: 09/15/2022   OT End of Session - 09/16/22 1016     Visit Number 12    Number of Visits 20    Date for OT Re-Evaluation 09/26/22    OT Start Time 1200    OT Stop Time 1255    OT Time Calculation (min) 55 min             Past Medical History:  Diagnosis Date   Anxiety    Depression    HSV infection    on daily medication   Migraine    Sickle cell anemia (HCC)    Sickle cell trait    Past Surgical History:  Procedure Laterality Date   ENDOMETRIAL ABLATION     WISDOM TOOTH EXTRACTION      There were no vitals filed for this visit.   Subjective Assessment - 09/16/22 1016     Currently in Pain? No/denies    Pain Score 0-No pain                  Group Session:  S: Doing better today. Was able to complete a major goal over the weekend that improved my sleep.   O: The session centered on the neuroscientific underpinnings of motivation and its inverse relationship with procrastination. Through a review of current  scientific literature, the group examined:  The function of dopamine as a critical neurotransmitter in the motivation circuitry of the brain. The physiological and psychological aspects of procrastination, including the impact of dopamine on the tendency to delay tasks. Strategies for leveraging an understanding of dopamine's role to enhance motivation, address procrastination, and foster productive habits. The objective was to provide a framework for participants to understand the neurochemical dynamics of motivation, relate these to personal experiences of procrastination, and apply evidence-based methods to improve focus and drive in both professional and personal contexts.   A:  The patient demonstrates a strong grasp of the material presented on the neurobiology of motivation and its implications for procrastination. They actively participated in discussions, showing an ability to connect scientific concepts to personal experiences. The patient exhibits insight into their own behavior patterns and expresses a keen interest in applying the strategies discussed to mitigate tendencies towards procrastination. The patient's engagement level indicates a readiness to implement behavioral changes that may enhance personal productivity and reduce procrastination.    P: Continue to attend PHP OT group sessions 5x week for 4 weeks to promote daily structure, social engagement, and opportunities to develop and utilize adaptive strategies to maximize functional performance in preparation for safe transition and integration  back into school, work, and the community. Plan to address topic of pt 3 in next OT group session.                 OT Education - 09/16/22 1016     Education Details Procrastination              OT Short Term Goals - 08/27/22 2059       OT SHORT TERM GOAL #1   Title Client will independently seek out and participate in a community or online support group to  reinforce coping strategies by time of discharge.    Time 4    Period Weeks    Status On-going    Target Date 09/26/22      OT SHORT TERM GOAL #2   Title By discharge, client will demonstrate the ability to adapt and modify routines when faced with unexpected events or changes, maintaining a balanced approach to daily tasks.      OT SHORT TERM GOAL #3   Title By the time of discharge, client will independently set, track, and make progress towards a long-term goal, demonstrating resilience in overcoming obstacles and seeking support when needed.                      Plan - 09/16/22 1016     Psychosocial Skills Coping Strategies;Habits;Interpersonal Interaction;Routines and Behaviors             Patient will benefit from skilled therapeutic intervention in order to improve the following deficits and impairments:       Psychosocial Skills: Coping Strategies, Habits, Interpersonal Interaction, Routines and Behaviors   Visit Diagnosis: Difficulty coping    Problem List Patient Active Problem List   Diagnosis Date Noted   Major depressive disorder, recurrent episode, severe (HCC) 08/21/2022   PTSD (post-traumatic stress disorder) 08/21/2022   Pain in left hip 10/11/2020   Depression, major, single episode, mild (HCC) 05/04/2018   Insomnia 05/04/2018   Migraine 07/16/2017   Joint stiffness 11/09/2015   Herpes simplex of female genitalia 08/16/2014   Essential hypertension 07/12/2014   Sickle cell trait (HCC) 07/12/2014    [name], OT 09/16/2022, 10:17 AM  [name], OT   [facility] HOSPITALIZATION PROGRAM [address] [place], [place], [address] Phone: [phone]   Fax:  [phone]  Name: VERTIE KRAKOW MRN: [id number] Date of Birth: 1976-05-09
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
## 2022-09-16 NOTE — Telephone Encounter (Signed)
D:  Patient was due to step down from [facility] and start MH-IOP on 09-22-22; but according to patient she plans to let Donia Guiles, LCSW know tomorrow as to when she will start.  "I was just told today that I'm being discharged on Friday (09-19-22) and I just need to sit with it before I make my decision."  A:  Oriented pt.  Encouraged pt to verify her benefits with her insurance company.  Will tentatively put pt down to start virtual MH-IOP on 09-22-22.  Inform Boneta Lucks.

## 2022-09-17 ENCOUNTER — Other Ambulatory Visit (HOSPITAL_COMMUNITY): Payer: BC Managed Care – PPO | Admitting: Licensed Clinical Social Worker

## 2022-09-17 ENCOUNTER — Encounter (HOSPITAL_COMMUNITY): Payer: Self-pay

## 2022-09-17 ENCOUNTER — Other Ambulatory Visit (HOSPITAL_COMMUNITY): Payer: BC Managed Care – PPO

## 2022-09-17 DIAGNOSIS — Z79624 Long term (current) use of inhibitors of nucleotide synthesis: Secondary | ICD-10-CM | POA: Diagnosis not present

## 2022-09-17 DIAGNOSIS — Z5986 Financial insecurity: Secondary | ICD-10-CM | POA: Diagnosis not present

## 2022-09-17 DIAGNOSIS — F411 Generalized anxiety disorder: Secondary | ICD-10-CM | POA: Diagnosis not present

## 2022-09-17 DIAGNOSIS — F4310 Post-traumatic stress disorder, unspecified: Secondary | ICD-10-CM

## 2022-09-17 DIAGNOSIS — F332 Major depressive disorder, recurrent severe without psychotic features: Secondary | ICD-10-CM | POA: Diagnosis not present

## 2022-09-17 DIAGNOSIS — D573 Sickle-cell trait: Secondary | ICD-10-CM | POA: Diagnosis not present

## 2022-09-17 DIAGNOSIS — Z79899 Other long term (current) drug therapy: Secondary | ICD-10-CM | POA: Diagnosis not present

## 2022-09-17 NOTE — Therapy (Signed)
[facility] PARTIAL HOSPITALIZATION PROGRAM [address] [place], [place], [address] Phone: [phone]   Fax:  [phone]  Occupational Therapy Treatment Virtual Visit via Video Note  I connected with Shirley Nunez on 09/17/22 at  8:00 AM EDT by a video enabled telemedicine application and verified that I am speaking with the correct person using two identifiers.  Location: Patient: home Provider: office   I discussed the limitations of evaluation and management by telemedicine and the availability of in person appointments. The patient expressed understanding and agreed to proceed.    The patient was advised to call back or seek an in-person evaluation if the symptoms worsen or if the condition fails to improve as anticipated.  I provided 55 minutes of non-face-to-face time during this encounter.   Patient Details  Name: Shirley Nunez MRN: [id number] Date of Birth: [birth year] No data recorded  Encounter Date: 09/16/2022   OT End of Session - 09/17/22 1017     Visit Number 13    Number of Visits 20    Date for OT Re-Evaluation 09/26/22    OT Start Time 1200    OT Stop Time 1255    OT Time Calculation (min) 55 min             Past Medical History:  Diagnosis Date   Anxiety    Depression    HSV infection    on daily medication   Migraine    Sickle cell anemia (HCC)    Sickle cell trait    Past Surgical History:  Procedure Laterality Date   ENDOMETRIAL ABLATION     WISDOM TOOTH EXTRACTION      There were no vitals filed for this visit.   Subjective Assessment - 09/17/22 1017     Currently in Pain? No/denies    Pain Score 0-No pain                Group Session:  S: Feeling a bit better today.   O: During the group therapy session, the occupational therapist discussed the impact of sleep disturbances on daily activities and overall health and wellbeing.   The OT also reviewed various types of sleep disorders,  including insomnia, sleep apnea, restless leg syndrome, and narcolepsy, and their associated symptoms. Strategies for managing and treating sleep disturbances were also discussed, such as establishing a consistent sleep routine, avoiding stimulants before bedtime, and engaging in relaxation techniques.   Today's group also included information on how sleep disturbances can cause fatigue, mood changes, cognitive impairment, and physical health problems, and emphasizes the importance of seeking prompt treatment to maintain overall health and wellbeing.   A: In today's session, the patient demonstrated active engagement with the topic of The Importance of Sleep. They eagerly asked questions, contributed personal experiences, and showcased a noticeable eagerness to apply the discussed principles. Their participation indicated not only a strong understanding of the subject matter but also an intrinsic motivation to implement better sleep practices in their daily routine. Based on their proactive involvement, it is assessed that the patient greatly benefited from today's treatment and will likely make efforts to incorporate the insights gained.   P: Continue to attend PHP OT group sessions 5x week for 4 weeks to promote daily structure, social engagement, and opportunities to develop and utilize adaptive strategies to maximize functional performance in preparation for safe transition and integration back into school, work, and the community. Plan to address topic of pt 2 in  next OT group session.                   OT Education - 09/17/22 1017     Education Details Sleep              OT Short Term Goals - 08/27/22 2059       OT SHORT TERM GOAL #1   Title Client will independently seek out and participate in a community or online support group to reinforce coping strategies by time of discharge.    Time 4    Period Weeks    Status On-going    Target Date 09/26/22      OT SHORT  TERM GOAL #2   Title By discharge, client will demonstrate the ability to adapt and modify routines when faced with unexpected events or changes, maintaining a balanced approach to daily tasks.      OT SHORT TERM GOAL #3   Title By the time of discharge, client will independently set, track, and make progress towards a long-term goal, demonstrating resilience in overcoming obstacles and seeking support when needed.                      Plan - 09/17/22 1017     Psychosocial Skills Coping Strategies;Habits;Interpersonal Interaction;Routines and Behaviors             Patient will benefit from skilled therapeutic intervention in order to improve the following deficits and impairments:       Psychosocial Skills: Coping Strategies, Habits, Interpersonal Interaction, Routines and Behaviors   Visit Diagnosis: Difficulty coping    Problem List Patient Active Problem List   Diagnosis Date Noted   Major depressive disorder, recurrent episode, severe (HCC) 08/21/2022   PTSD (post-traumatic stress disorder) 08/21/2022   Pain in left hip 10/11/2020   Depression, major, single episode, mild (HCC) 05/04/2018   Insomnia 05/04/2018   Migraine 07/16/2017   Joint stiffness 11/09/2015   Herpes simplex of female genitalia 08/16/2014   Essential hypertension 07/12/2014   Sickle cell trait (HCC) 07/12/2014    [name], OT 09/17/2022, 10:18 AM [name], OT  [facility] HOSPITALIZATION PROGRAM [address] [place], [place], [address] Phone: [phone]   Fax:  [phone]  Name: Shirley Nunez MRN: [id number] Date of Birth: 1976/03/01

## 2022-09-18 ENCOUNTER — Other Ambulatory Visit (HOSPITAL_COMMUNITY): Payer: BC Managed Care – PPO | Admitting: Licensed Clinical Social Worker

## 2022-09-18 ENCOUNTER — Other Ambulatory Visit (HOSPITAL_COMMUNITY): Payer: BC Managed Care – PPO

## 2022-09-18 DIAGNOSIS — Z79899 Other long term (current) drug therapy: Secondary | ICD-10-CM | POA: Diagnosis not present

## 2022-09-18 DIAGNOSIS — F4310 Post-traumatic stress disorder, unspecified: Secondary | ICD-10-CM

## 2022-09-18 DIAGNOSIS — F332 Major depressive disorder, recurrent severe without psychotic features: Secondary | ICD-10-CM | POA: Diagnosis not present

## 2022-09-18 DIAGNOSIS — D573 Sickle-cell trait: Secondary | ICD-10-CM | POA: Diagnosis not present

## 2022-09-18 DIAGNOSIS — Z79624 Long term (current) use of inhibitors of nucleotide synthesis: Secondary | ICD-10-CM | POA: Diagnosis not present

## 2022-09-18 DIAGNOSIS — F411 Generalized anxiety disorder: Secondary | ICD-10-CM | POA: Diagnosis not present

## 2022-09-18 DIAGNOSIS — R4589 Other symptoms and signs involving emotional state: Secondary | ICD-10-CM

## 2022-09-18 DIAGNOSIS — Z5986 Financial insecurity: Secondary | ICD-10-CM | POA: Diagnosis not present

## 2022-09-18 NOTE — Progress Notes (Signed)
Virtual Visit via Video Note  I connected with Shirley Nunez on 09/18/22 at  9:00 AM EDT by a video enabled telemedicine application and verified that I am speaking with the correct person using two identifiers.  Location: Patient: Home Provider: Office   I discussed the limitations of evaluation and management by telemedicine and the availability of in person appointments. The patient expressed understanding and agreed to proceed.    I discussed the assessment and treatment plan with the patient. The patient was provided an opportunity to ask questions and all were answered. The patient agreed with the plan and demonstrated an understanding of the instructions.   The patient was advised to call back or seek an in-person evaluation if the symptoms worsen or if the condition fails to improve as anticipated.    [name], MD  [facility] Partial Hospitalization Program Psych Discharge Summary  Shirley Nunez [id number]  Admission date: 08/26/2022 Discharge date: 09/18/2022  Reason for admission: Depression, Anxiety, PTSD  Progress in Program Toward Treatment Goals: Progressing  Progress (rationale):  Shirley Nunez is a 47 yo patient with a PPH of PTSD, GAD, and MDD.   Patient has had medication trials, and endorsed significant anxiety about medications throughout group, but was willing to give a new medication a try once she verbalized her medication anxiety to provider during treatment.  Patient has been engaged in therapy. She has gotten better rest on Remeron, but is still having some night time awakenings. Patient is eating a bit more as well. Patient reports that she still has some nightmares related to her trauma's in there past. These will lead to her waking up and not being able to get back to sleep. Patient denies SI, she still has fleeting thoughts of self-harm but does not want to act on the thoughts of cutting and is able to push them away. She is still struggling with low mood  and irritability. Patient denies HI and AVH.  Patient reports that her energy during the day can be better some days, and other days she still struggles with low energy.  Patient reports that she has felt less alone as a result of being in PHP, and that she has other who understand her. She also feels like she has been working on Pharmacologist, she endorses that she is more open and willing to give things a try rather than quit. She hopes to work on boundary setting in IOP.  Psychiatric Specialty Exam:   Review of Systems  Psychiatric/Behavioral:  Positive for sleep disturbance. Negative for dysphoric mood, hallucinations and suicidal ideas.     There were no vitals taken for this visit.There is no height or weight on file to calculate BMI.  General Appearance: Casual  Eye Contact:  Good  Speech:  Clear and Coherent  Volume:  Normal  Mood:  Euthymic  Affect:  Appropriate  Thought Process:  Coherent  Orientation:  Full (Time, Place, and Person)  Thought Content:  Logical  Suicidal Thoughts:  No  Homicidal Thoughts:  No  Memory:  Immediate;   Good Recent;   Good  Judgement:  Other:  Improving  Insight:  Shallow  Psychomotor Activity:  Normal  Concentration:  Concentration: Fair  Recall:  NA  Fund of Knowledge:  Good  Language:  Good  Akathisia:  NA  Handed:    AIMS (if indicated):     Assets:  Desire for Improvement Leisure Time Resilience Social Support Transportation  ADL's:  Intact  Cognition:  WNL  Sleep:   IMproving     Discharge Plan:  Going to IOP No refills needed currently May want to consider propanolol after having time on Remeron alone for continued PTSD related nightmares and hypervigilance.   GAD PTSD Panic d/o MDD, recurrent, severe - Continue remeron 7.5mg  QHS  Collaboration of Care: Group therapy  Patient/Guardian was advised Release of Information must be obtained prior to any record release in order to collaborate their care with an outside  provider. Patient/Guardian was advised if they have not already done so to contact the registration department to sign all necessary forms in order for [place] to release information regarding their care.   Consent: Patient/Guardian gives verbal consent for treatment and assignment of benefits for services provided during this visit. Patient/Guardian expressed understanding and agreed to proceed.   PGY-3 Eliseo Gum, MD 09/18/2022

## 2022-09-18 NOTE — Psych (Signed)
Virtual Visit via Video Note  I connected with Shirley Nunez on 09/16/22 at  9:00 AM EDT by a video enabled telemedicine application and verified that I am speaking with the correct person using two identifiers.  Location: Patient: patient home Provider: clinical home office   I discussed the limitations of evaluation and management by telemedicine and the availability of in person appointments. The patient expressed understanding and agreed to proceed.  I discussed the assessment and treatment plan with the patient. The patient was provided an opportunity to ask questions and all were answered. The patient agreed with the plan and demonstrated an understanding of the instructions.   The patient was advised to call back or seek an in-person evaluation if the symptoms worsen or if the condition fails to improve as anticipated.  Pt was provided 240 minutes of non-face-to-face time during this encounter.   Donia Guiles, LCSW   [facility] BH PHP THERAPIST PROGRESS NOTE  Shirley Nunez [id number]  Session Time: 9:00 - 10:00  Participation Level: Active  Behavioral Response: CasualAlertDepressed  Type of Therapy: Group Therapy  Treatment Goals addressed: Coping  Progress Towards Goals: Progressing  Interventions: CBT, DBT, Supportive, and Reframing  Summary: Shirley Nunez is a 47 y.o. female who presents with depression and trauma symptoms.  Clinician led check-in regarding current stressors and situation, and review of patient completed daily inventory. Clinician utilized active listening and empathetic response and validated patient emotions. Clinician facilitated processing group on pertinent issues.?    Therapist Response: Patient arrived within time allowed. Patient rates her mood at a 8 on a scale of 1-10 with 10 being best. Pt states she feels "pretty good." Pt states she slept 7 hours and ate 1x. Pt reports she feels more in control of herself today. Pt states she spoke to her  guy friend and continued to try to do opposite action to make new habits. Pt states a positive interaction with her ex that made her hopeful. Pt continues to struggle with mood dependent thinking.  Patient able to process. Patient engaged in discussion.            Session Time: 10:00 am - 11:00 am   Participation Level: Active   Behavioral Response: CasualAlertDepressed   Type of Therapy: Group Therapy   Treatment Goals addressed: Coping   Progress Towards Goals: Progressing   Interventions: CBT, DBT, Solution Focused, Strength-based, Supportive, and Reframing   Therapist Response: Cln led discussion on the connection between fear and anxiety. Cln contextualized anxiety and fear as both being future oriented and fed by the unknown. Group members shared ways in which fear interacts with their anxiety and the problems it creates. Cln encouraged CBT thought challenging and reframing as ways to address problematic fears and anxieties.    Therapist Response:  Pt engaged in discussion and is able to make connections between fear and anxiety.          Session Time: 11:00 -12:00   Participation Level: Active   Behavioral Response: CasualAlertDepressed   Type of Therapy: Group Therapy   Treatment Goals addressed: Coping   Progress Towards Goals: Progressing   Interventions: CBT, DBT, Solution Focused, Strength-based, Supportive, and Reframing   Summary: Cln led discussion on making difficult decisions. Group members discussed decisions they are struggling with. Cln utilized CBT thought challenging and DBT walking the middle path to inform discussion.   Therapist Response:  Pt engaged in discussion.          Session Time:  12:00 -1:00   Participation Level: Active   Behavioral Response: CasualAlertDepressed   Type of Therapy: Group therapy, Occupational Therapy   Treatment Goals addressed: Coping   Progress Towards Goals: Progressing   Interventions: Supportive;  Psychoeducation   Summary: 12:00 - 12:50: Occupational Therapy group led by cln E. Hollan. 12:50 - 1:00 Clinician assessed for immediate needs, medication compliance and efficacy, and safety concerns.   Therapist Response: 12:00 - 12:50: See OT note 12:50 - 1:00 pm: At check-out, patient reports no immediate concerns. Patient demonstrates progress as evidenced by continued engagement and responsiveness to treatment. Patient denies SI/HI/self-harm thoughts at the end of group.      Suicidal/Homicidal: Nowithout intent/plan  Plan: Pt will continue in PHP while working to decrease trauma and depression symptoms, increase daily functioning, and increase ability to manage symptoms in a healthy manner.   Collaboration of Care: Medication Management AEB J. McQuilla  Patient/Guardian was advised Release of Information must be obtained prior to any record release in order to collaborate their care with an outside provider. Patient/Guardian was advised if they have not already done so to contact the registration department to sign all necessary forms in order for [place] to release information regarding their care.   Consent: Patient/Guardian gives verbal consent for treatment and assignment of benefits for services provided during this visit. Patient/Guardian expressed understanding and agreed to proceed.   Diagnosis: Severe episode of recurrent major depressive disorder, without psychotic features (HCC) [F33.2]    1. Severe episode of recurrent major depressive disorder, without psychotic features (HCC)   2. PTSD (post-traumatic stress disorder)      Donia Guiles, LCSW

## 2022-09-18 NOTE — Psych (Signed)
Virtual Visit via Video Note  I connected with Shirley Nunez on 09/15/22 at  9:00 AM EDT by a video enabled telemedicine application and verified that I am speaking with the correct person using two identifiers.  Location: Patient: patient home Provider: clinical home office   I discussed the limitations of evaluation and management by telemedicine and the availability of in person appointments. The patient expressed understanding and agreed to proceed.  I discussed the assessment and treatment plan with the patient. The patient was provided an opportunity to ask questions and all were answered. The patient agreed with the plan and demonstrated an understanding of the instructions.   The patient was advised to call back or seek an in-person evaluation if the symptoms worsen or if the condition fails to improve as anticipated.  Pt was provided 240 minutes of non-face-to-face time during this encounter.   Donia Guiles, LCSW   [facility] BH PHP THERAPIST PROGRESS NOTE  Shirley Nunez [id number]  Session Time: 9:00 - 10:00  Participation Level: Active  Behavioral Response: CasualAlertDepressed  Type of Therapy: Group Therapy  Treatment Goals addressed: Coping  Progress Towards Goals: Progressing  Interventions: CBT, DBT, Supportive, and Reframing  Summary: Shirley Nunez is a 47 y.o. female who presents with depression and trauma symptoms.  Clinician led check-in regarding current stressors and situation, and review of patient completed daily inventory. Clinician utilized active listening and empathetic response and validated patient emotions. Clinician facilitated processing group on pertinent issues.?    Therapist Response: Patient arrived within time allowed. Patient rates her mood at a 6 on a scale of 1-10 with 10 being best. Pt states she feels "okay." Pt states she slept 6 hours and ate 2x. Pt reports she had an "emotional" weekend and had a "good cry." Pt states she struggles  with milestones in her children's lives because it sparks grief for her child who has passed. Pt states struggling with son's upcoming graduation and daughter beginning her first period for this reason. Pt states she completed task of finishing laundry which has been lingering for weeks. Pt reports opening up more to her guy friend and feeling insecure. Patient able to process. Patient engaged in discussion.            Session Time: 10:00 am - 11:00 am   Participation Level: Active   Behavioral Response: CasualAlertDepressed   Type of Therapy: Group Therapy   Treatment Goals addressed: Coping   Progress Towards Goals: Progressing   Interventions: CBT, DBT, Solution Focused, Strength-based, Supportive, and Reframing   Therapist Response: Cln led discussion on anxious behaviors. Group members shared patterns of anxiety they experience. Cln encouraged pt's to increase insight into the roots of their anxious behaviors to be able to address triggers.    Therapist Response: Pt engaged in discussion and reports increased insight into triggers.        Session Time: 11:00 -12:00   Participation Level: Active   Behavioral Response: CasualAlertDepressed   Type of Therapy: Group Therapy   Treatment Goals addressed: Coping   Progress Towards Goals: Progressing   Interventions: CBT, DBT, Solution Focused, Strength-based, Supportive, and Reframing   Summary: Cln continued topic of CBT cognitive distortions and introduced thought challenging as a way to  utilize the "challenge" C in C-C-C. Group utilized Administrator, Civil [employer] questions" as a way to introduce challenges and reframe distorted thinking. Group members worked through pt examples to practice challenging distorted thinking.    Therapist Response: Pt engaged in discussion  and demonstrates understanding of challenging distorted thoughts through practice.          Session Time: 12:00 -1:00   Participation Level: Active    Behavioral Response: CasualAlertDepressed   Type of Therapy: Group therapy, Occupational Therapy   Treatment Goals addressed: Coping   Progress Towards Goals: Progressing   Interventions: Supportive; Psychoeducation   Summary: 12:00 - 12:50: Occupational Therapy group led by cln E. Hollan. 12:50 - 1:00 Clinician assessed for immediate needs, medication compliance and efficacy, and safety concerns.   Therapist Response: 12:00 - 12:50: See OT note 12:50 - 1:00 pm: At check-out, patient reports no immediate concerns. Patient demonstrates progress as evidenced by continued engagement and responsiveness to treatment. Patient denies SI/HI/self-harm thoughts at the end of group.      Suicidal/Homicidal: Nowithout intent/plan  Plan: Pt will continue in PHP while working to decrease trauma and depression symptoms, increase daily functioning, and increase ability to manage symptoms in a healthy manner.   Collaboration of Care: Medication Management AEB J. McQuilla  Patient/Guardian was advised Release of Information must be obtained prior to any record release in order to collaborate their care with an outside provider. Patient/Guardian was advised if they have not already done so to contact the registration department to sign all necessary forms in order for [place] to release information regarding their care.   Consent: Patient/Guardian gives verbal consent for treatment and assignment of benefits for services provided during this visit. Patient/Guardian expressed understanding and agreed to proceed.   Diagnosis: Severe episode of recurrent major depressive disorder, without psychotic features (HCC) [F33.2]    1. Severe episode of recurrent major depressive disorder, without psychotic features (HCC)   2. PTSD (post-traumatic stress disorder)      Donia Guiles, LCSW

## 2022-09-18 NOTE — Psych (Signed)
Virtual Visit via Video Note  I connected with Ercie Z Barreras on 09/12/22 at  9:00 AM EDT by a video enabled telemedicine application and verified that I am speaking with the correct person using two identifiers.  Location: Patient: patient home Provider: clinical home office   I discussed the limitations of evaluation and management by telemedicine and the availability of in person appointments. The patient expressed understanding and agreed to proceed.  I discussed the assessment and treatment plan with the patient. The patient was provided an opportunity to ask questions and all were answered. The patient agreed with the plan and demonstrated an understanding of the instructions.   The patient was advised to call back or seek an in-person evaluation if the symptoms worsen or if the condition fails to improve as anticipated.  Pt was provided 240 minutes of non-face-to-face time during this encounter.   Donia Guiles, LCSW   [facility] BH PHP THERAPIST PROGRESS NOTE  Isis Zisk Nee [id number]  Session Time: 9:00 - 10:00  Participation Level: Active  Behavioral Response: CasualAlertDepressed  Type of Therapy: Group Therapy  Treatment Goals addressed: Coping  Progress Towards Goals: Progressing  Interventions: CBT, DBT, Supportive, and Reframing  Summary: Tama Waltermire Maita is a 47 y.o. female who presents with depression and trauma symptoms.  Clinician led check-in regarding current stressors and situation, and review of patient completed daily inventory. Clinician utilized active listening and empathetic response and validated patient emotions. Clinician facilitated processing group on pertinent issues.?    Therapist Response: Patient arrived within time allowed. Patient rates her mood at a 4 on a scale of 1-10 with 10 being best. Pt states she feels "not great." Pt states she slept most of the day and night and ate 0x. Pt reports she was triggered during group yesterday and cln worked  with pt to identify what upset her. Pt able to identify a lack of trust in professionals and sensitivity to discussing medications with people she does not have history with as the reason for her negative reaction to group yesterday. Pt states she slept to manage her mood. Patient able to process. Patient engaged in discussion.            Session Time: 10:00 am - 11:00 am   Participation Level: Active   Behavioral Response: CasualAlertDepressed   Type of Therapy: Group Therapy   Treatment Goals addressed: Coping   Progress Towards Goals: Progressing   Interventions: CBT, DBT, Solution Focused, Strength-based, Supportive, and Reframing   Therapist Response: Cln led discussion on ways to manage stressors and feelings over the weekend. Group members  brainstormed things to do over the weekend for multiple levels of energy, access, and moods. Cln reviewed crisis services should they be needed and provided pt's with the text crisis line, mobile crisis, national suicide hotline, [facility] 24/7 line, and information on [facility].      Therapist Response:  Pt engaged in discussion and is able to identify 3 ideas of what to do over the weekend to keep their mind engaged.          Session Time: 11:00 -12:00   Participation Level: Active   Behavioral Response: CasualAlertDepressed   Type of Therapy: Group Therapy   Treatment Goals addressed: Coping   Progress Towards Goals: Progressing   Interventions: CBT, DBT, Solution Focused, Strength-based, Supportive, and Reframing   Summary: Cln continued topic of CBT cognitive distortions. Cln utilized handout "cognitive distortions" to discuss common examples of distorted thoughts and group  members worked to identify examples in their own life.    Therapist Response: Pt engaged in discussion.          Session Time: 12:00 -1:00   Participation Level: Active   Behavioral Response: CasualAlertDepressed   Type of Therapy: Group therapy,  Occupational Therapy   Treatment Goals addressed: Coping   Progress Towards Goals: Progressing   Interventions: Supportive; Psychoeducation   Summary: 12:00 - 12:50: Occupational Therapy group led by cln E. Hollan. 12:50 - 1:00 Clinician assessed for immediate needs, medication compliance and efficacy, and safety concerns.   Therapist Response: 12:00 - 12:50: See OT note 12:50 - 1:00 pm: At check-out, patient reports no immediate concerns. Patient demonstrates progress as evidenced by continued engagement and responsiveness to treatment. Patient denies SI/HI/self-harm thoughts at the end of group.      Suicidal/Homicidal: Nowithout intent/plan  Plan: Pt will continue in PHP while working to decrease trauma and depression symptoms, increase daily functioning, and increase ability to manage symptoms in a healthy manner.   Collaboration of Care: Medication Management AEB J. McQuilla  Patient/Guardian was advised Release of Information must be obtained prior to any record release in order to collaborate their care with an outside provider. Patient/Guardian was advised if they have not already done so to contact the registration department to sign all necessary forms in order for [place] to release information regarding their care.   Consent: Patient/Guardian gives verbal consent for treatment and assignment of benefits for services provided during this visit. Patient/Guardian expressed understanding and agreed to proceed.   Diagnosis: PTSD (post-traumatic stress disorder) [F43.10]    1. PTSD (post-traumatic stress disorder)   2. Severe episode of recurrent major depressive disorder, without psychotic features (HCC)      Donia Guiles, LCSW

## 2022-09-19 ENCOUNTER — Other Ambulatory Visit (HOSPITAL_COMMUNITY): Payer: BC Managed Care – PPO

## 2022-09-19 ENCOUNTER — Encounter (HOSPITAL_COMMUNITY): Payer: Self-pay

## 2022-09-19 ENCOUNTER — Other Ambulatory Visit (HOSPITAL_COMMUNITY): Payer: BC Managed Care – PPO | Admitting: Licensed Clinical Social Worker

## 2022-09-19 DIAGNOSIS — F4310 Post-traumatic stress disorder, unspecified: Secondary | ICD-10-CM

## 2022-09-19 DIAGNOSIS — F411 Generalized anxiety disorder: Secondary | ICD-10-CM | POA: Diagnosis not present

## 2022-09-19 DIAGNOSIS — R4589 Other symptoms and signs involving emotional state: Secondary | ICD-10-CM

## 2022-09-19 DIAGNOSIS — F332 Major depressive disorder, recurrent severe without psychotic features: Secondary | ICD-10-CM

## 2022-09-19 DIAGNOSIS — Z79624 Long term (current) use of inhibitors of nucleotide synthesis: Secondary | ICD-10-CM | POA: Diagnosis not present

## 2022-09-19 DIAGNOSIS — D573 Sickle-cell trait: Secondary | ICD-10-CM | POA: Diagnosis not present

## 2022-09-19 DIAGNOSIS — Z5986 Financial insecurity: Secondary | ICD-10-CM | POA: Diagnosis not present

## 2022-09-19 DIAGNOSIS — Z79899 Other long term (current) drug therapy: Secondary | ICD-10-CM | POA: Diagnosis not present

## 2022-09-19 NOTE — Therapy (Signed)
[facility] PARTIAL HOSPITALIZATION PROGRAM [address] [place], [place], [address] Phone: [phone]   Fax:  [phone]  Occupational Therapy Treatment Virtual Visit via Video Note  I connected with Christianna Gutt Guilliams on 09/19/22 at  8:00 AM EDT by a video enabled telemedicine application and verified that I am speaking with the correct person using two identifiers.  Location: Patient: home Provider: office   I discussed the limitations of evaluation and management by telemedicine and the availability of in person appointments. The patient expressed understanding and agreed to proceed.    The patient was advised to call back or seek an in-person evaluation if the symptoms worsen or if the condition fails to improve as anticipated.  I provided 55 minutes of non-face-to-face time during this encounter.   Patient Details  Name: Shirley Nunez MRN: [id number] Date of Birth: 1975-06-24 No data recorded  Encounter Date: 09/18/2022   OT End of Session - 09/19/22 0923     Visit Number 14    Number of Visits 20    Date for OT Re-Evaluation 09/26/22    OT Start Time 1200    OT Stop Time 1255    OT Time Calculation (min) 55 min             Past Medical History:  Diagnosis Date   Anxiety    Depression    HSV infection    on daily medication   Migraine    Sickle cell anemia (HCC)    Sickle cell trait    Past Surgical History:  Procedure Laterality Date   ENDOMETRIAL ABLATION     WISDOM TOOTH EXTRACTION      There were no vitals filed for this visit.   Subjective Assessment - 09/19/22 0923     Currently in Pain? No/denies    Pain Score 0-No pain               Group Session:  S: Doing better today.   O: The primary objective of this topic is to explore and understand the concept of occupational balance in the context of daily living. The term "occupational balance" is defined broadly, encompassing all activities that occupy an  individual's time and energy, including self-care, leisure, and work-related tasks. The goal is to guide participants towards achieving a harmonious blend of these activities, tailored to their personal values and life circumstances. This balance is aimed at enhancing overall well-being, not by equally distributing time across activities, but by ensuring that daily engagements are fulfilling and not draining. The content delves into identifying various barriers that individuals face in achieving occupational balance, such as overcommitment, misaligned priorities, external pressures, and lack of effective time management. The impact of these barriers on occupational performance, roles, and lifestyles is examined, highlighting issues like reduced efficiency, strained relationships, and potential health problems. Strategies for cultivating occupational balance are a key focus. These strategies include practical methods like time blocking, prioritizing tasks, establishing self-care rituals, decluttering, connecting with nature, and engaging in reflective practices. These approaches are designed to be adaptable and applicable to a wide range of life scenarios, promoting a proactive and mindful approach to daily living. The overall aim is to equip participants with the knowledge and tools to create a balanced lifestyle that supports their mental, emotional, and physical health, thereby improving their functional performance in daily life.   A:  The patient demonstrated a high level of engagement and active participation throughout the session on occupational balance.  The patient frequently contributed to discussions, offering insightful reflections on personal experiences related to the barriers and strategies for achieving occupational balance. There was a clear understanding of the concept and an ability to relate it to their own life. The patient showed enthusiasm in learning and applying the strategies  discussed, such as time blocking and self-care rituals, indicating a strong motivation to improve their occupational balance. The patient's proactive approach and responsiveness to the topic suggest a high potential for implementing these strategies effectively in their daily routine.    P: Continue to attend PHP OT group sessions 5x week for 4 weeks to promote daily structure, social engagement, and opportunities to develop and utilize adaptive strategies to maximize functional performance in preparation for safe transition and integration back into school, work, and the community. Plan to address topic of pt 2 in next OT group session.                    OT Education - 09/19/22 0923     Education Details Occupational Balance              OT Short Term Goals - 08/27/22 2059       OT SHORT TERM GOAL #1   Title Client will independently seek out and participate in a community or online support group to reinforce coping strategies by time of discharge.    Time 4    Period Weeks    Status On-going    Target Date 09/26/22      OT SHORT TERM GOAL #2   Title By discharge, client will demonstrate the ability to adapt and modify routines when faced with unexpected events or changes, maintaining a balanced approach to daily tasks.      OT SHORT TERM GOAL #3   Title By the time of discharge, client will independently set, track, and make progress towards a long-term goal, demonstrating resilience in overcoming obstacles and seeking support when needed.                      Plan - 09/19/22 0923     Psychosocial Skills Coping Strategies;Habits;Interpersonal Interaction;Routines and Behaviors             Patient will benefit from skilled therapeutic intervention in order to improve the following deficits and impairments:       Psychosocial Skills: Coping Strategies, Habits, Interpersonal Interaction, Routines and Behaviors   Visit  Diagnosis: Difficulty coping    Problem List Patient Active Problem List   Diagnosis Date Noted   Major depressive disorder, recurrent episode, severe (HCC) 08/21/2022   PTSD (post-traumatic stress disorder) 08/21/2022   Pain in left hip 10/11/2020   Depression, major, single episode, mild (HCC) 05/04/2018   Insomnia 05/04/2018   Migraine 07/16/2017   Joint stiffness 11/09/2015   Herpes simplex of female genitalia 08/16/2014   Essential hypertension 07/12/2014   Sickle cell trait (HCC) 07/12/2014    [name], OT 09/19/2022, 9:23 AM  [name], OT   [facility] HOSPITALIZATION PROGRAM [address] [place], [place], [address] Phone: [phone]   Fax:  [phone]  Name: Shirley Nunez MRN: [id number] Date of Birth: 29-Oct-1975

## 2022-09-22 ENCOUNTER — Encounter (HOSPITAL_COMMUNITY): Payer: Self-pay

## 2022-09-22 NOTE — Therapy (Addendum)
[facility] PARTIAL HOSPITALIZATION PROGRAM [address] [place], [place], [address] Phone: [phone]   Fax:  [phone]  Occupational Therapy Treatment Virtual Visit via Video Note  I connected with Shirley Nunez on 09/22/22 at  8:00 AM EDT by a video enabled telemedicine application and verified that I am speaking with the correct person using two identifiers.  Location: Patient: home Provider: office   I discussed the limitations of evaluation and management by telemedicine and the availability of in person appointments. The patient expressed understanding and agreed to proceed.    The patient was advised to call back or seek an in-person evaluation if the symptoms worsen or if the condition fails to improve as anticipated.  I provided 55 minutes of non-face-to-face time during this encounter.   Patient Details  Name: Shirley Nunez MRN: [id number] Date of Birth: May 23, 1975 No data recorded  Encounter Date: 09/19/2022   OT End of Session - 09/22/22 2029     Visit Number 15    Number of Visits 20    Date for OT Re-Evaluation 09/26/22    OT Start Time 1200    OT Stop Time 1255    OT Time Calculation (min) 55 min             Past Medical History:  Diagnosis Date   Anxiety    Depression    HSV infection    on daily medication   Migraine    Sickle cell anemia (HCC)    Sickle cell trait    Past Surgical History:  Procedure Laterality Date   ENDOMETRIAL ABLATION     WISDOM TOOTH EXTRACTION      There were no vitals filed for this visit.   Subjective Assessment - 09/22/22 2029     Currently in Pain? No/denies    Pain Score 0-No pain                  Group Session:  S: Feel like I've learned a lot. Still a little nervous about going forward but I am in a better place now.   O: The primary objective of this topic is to explore and understand the concept of occupational balance in the context of daily living. The  term "occupational balance" is defined broadly, encompassing all activities that occupy an individual's time and energy, including self-care, leisure, and work-related tasks. The goal is to guide participants towards achieving a harmonious blend of these activities, tailored to their personal values and life circumstances. This balance is aimed at enhancing overall well-being, not by equally distributing time across activities, but by ensuring that daily engagements are fulfilling and not draining. The content delves into identifying various barriers that individuals face in achieving occupational balance, such as overcommitment, misaligned priorities, external pressures, and lack of effective time management. The impact of these barriers on occupational performance, roles, and lifestyles is examined, highlighting issues like reduced efficiency, strained relationships, and potential health problems. Strategies for cultivating occupational balance are a key focus. These strategies include practical methods like time blocking, prioritizing tasks, establishing self-care rituals, decluttering, connecting with nature, and engaging in reflective practices. These approaches are designed to be adaptable and applicable to a wide range of life scenarios, promoting a proactive and mindful approach to daily living. The overall aim is to equip participants with the knowledge and tools to create a balanced lifestyle that supports their mental, emotional, and physical health, thereby improving their functional performance in daily life.  A: The primary objective of this topic is to explore and understand the concept of occupational balance in the context of daily living. The term "occupational balance" is defined broadly, encompassing all activities that occupy an individual's time and energy, including self-care, leisure, and work-related tasks. The goal is to guide participants towards achieving a harmonious blend of  these activities, tailored to their personal values and life circumstances. This balance is aimed at enhancing overall well-being, not by equally distributing time across activities, but by ensuring that daily engagements are fulfilling and not draining. The content delves into identifying various barriers that individuals face in achieving occupational balance, such as overcommitment, misaligned priorities, external pressures, and lack of effective time management. The impact of these barriers on occupational performance, roles, and lifestyles is examined, highlighting issues like reduced efficiency, strained relationships, and potential health problems. Strategies for cultivating occupational balance are a key focus. These strategies include practical methods like time blocking, prioritizing tasks, establishing self-care rituals, decluttering, connecting with nature, and engaging in reflective practices. These approaches are designed to be adaptable and applicable to a wide range of life scenarios, promoting a proactive and mindful approach to daily living. The overall aim is to equip participants with the knowledge and tools to create a balanced lifestyle that supports their mental, emotional, and physical health, thereby improving their functional performance in daily life.  OCCUPATIONAL THERAPY DISCHARGE SUMMARY  Visits from Start of Care: 15  Current functional level related to goals / functional outcomes: Pt has met all OT goals at this time and is ready for DC   Remaining deficits: No OT deficits remaining    Education / Equipment:   Plan: Patient agrees to discharge.                     OT Education - 09/22/22 2029     Education Details Occupational Balance              OT Short Term Goals - 08/27/22 2059       OT SHORT TERM GOAL #1   Title Client will independently seek out and participate in a community or online support group to reinforce coping  strategies by time of discharge.    Time 4    Period Weeks    Status met   Target Date 09/26/22      OT SHORT TERM GOAL #2   Title By discharge, client will demonstrate the ability to adapt and modify routines when faced with unexpected events or changes, maintaining a balanced approach to daily tasks.  met     OT SHORT TERM GOAL #3   Title By the time of discharge, client will independently set, track, and make progress towards a long-term goal, demonstrating resilience in overcoming obstacles and seeking support when needed.  met                     Plan - 09/22/22 2029     Psychosocial Skills Coping Strategies;Habits;Interpersonal Interaction;Routines and Behaviors             Patient will benefit from skilled therapeutic intervention in order to improve the following deficits and impairments:       Psychosocial Skills: Coping Strategies, Habits, Interpersonal Interaction, Routines and Behaviors   Visit Diagnosis: Difficulty coping    Problem List Patient Active Problem List   Diagnosis Date Noted   Major depressive disorder, recurrent episode, severe (HCC) 08/21/2022   PTSD (post-traumatic stress disorder) 08/21/2022  Pain in left hip 10/11/2020   Depression, major, single episode, mild (HCC) 05/04/2018   Insomnia 05/04/2018   Migraine 07/16/2017   Joint stiffness 11/09/2015   Herpes simplex of female genitalia 08/16/2014   Essential hypertension 07/12/2014   Sickle cell trait (HCC) 07/12/2014    [name], OT 09/22/2022, 8:30 PM [name], OT  [facility] HOSPITALIZATION PROGRAM [address] [place], [place], [address] Phone: [phone]   Fax:  [phone]  Name: Shirley Nunez MRN: [id number] Date of Birth: 1976-02-01

## 2022-09-25 ENCOUNTER — Encounter (HOSPITAL_COMMUNITY): Payer: Self-pay | Admitting: Psychiatry

## 2022-09-25 ENCOUNTER — Other Ambulatory Visit (HOSPITAL_COMMUNITY): Payer: BC Managed Care – PPO | Attending: Psychiatry | Admitting: Licensed Clinical Social Worker

## 2022-09-25 DIAGNOSIS — F332 Major depressive disorder, recurrent severe without psychotic features: Secondary | ICD-10-CM | POA: Diagnosis not present

## 2022-09-25 DIAGNOSIS — F411 Generalized anxiety disorder: Secondary | ICD-10-CM | POA: Diagnosis not present

## 2022-09-25 DIAGNOSIS — Z79899 Other long term (current) drug therapy: Secondary | ICD-10-CM | POA: Insufficient documentation

## 2022-09-25 DIAGNOSIS — F4310 Post-traumatic stress disorder, unspecified: Secondary | ICD-10-CM | POA: Diagnosis not present

## 2022-09-25 MED ORDER — MIRTAZAPINE 15 MG PO TABS: 15.0000 mg | ORAL_TABLET | Freq: Every day | ORAL | 0 refills | Status: DC
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
## 2022-09-26 ENCOUNTER — Other Ambulatory Visit (HOSPITAL_COMMUNITY): Payer: BC Managed Care – PPO | Admitting: Licensed Clinical Social Worker

## 2022-09-26 DIAGNOSIS — F4310 Post-traumatic stress disorder, unspecified: Secondary | ICD-10-CM

## 2022-09-26 DIAGNOSIS — F411 Generalized anxiety disorder: Secondary | ICD-10-CM | POA: Diagnosis not present

## 2022-09-26 DIAGNOSIS — Z79899 Other long term (current) drug therapy: Secondary | ICD-10-CM | POA: Diagnosis not present

## 2022-09-26 DIAGNOSIS — F332 Major depressive disorder, recurrent severe without psychotic features: Secondary | ICD-10-CM | POA: Diagnosis not present

## 2022-09-29 ENCOUNTER — Other Ambulatory Visit (HOSPITAL_COMMUNITY): Payer: BC Managed Care – PPO | Admitting: Licensed Clinical Social Worker

## 2022-09-29 DIAGNOSIS — F332 Major depressive disorder, recurrent severe without psychotic features: Secondary | ICD-10-CM

## 2022-09-29 DIAGNOSIS — F4310 Post-traumatic stress disorder, unspecified: Secondary | ICD-10-CM

## 2022-09-29 DIAGNOSIS — Z79899 Other long term (current) drug therapy: Secondary | ICD-10-CM | POA: Diagnosis not present

## 2022-09-29 DIAGNOSIS — F411 Generalized anxiety disorder: Secondary | ICD-10-CM | POA: Diagnosis not present

## 2022-09-29 NOTE — Psych (Signed)
Virtual Visit via Video Note  I connected with Shirley Nunez on 09/17/22 at  9:00 AM EDT by a video enabled telemedicine application and verified that I am speaking with the correct person using two identifiers.  Location: Patient: patient home Provider: clinical home office   I discussed the limitations of evaluation and management by telemedicine and the availability of in person appointments. The patient expressed understanding and agreed to proceed.  I discussed the assessment and treatment plan with the patient. The patient was provided an opportunity to ask questions and all were answered. The patient agreed with the plan and demonstrated an understanding of the instructions.   The patient was advised to call back or seek an in-person evaluation if the symptoms worsen or if the condition fails to improve as anticipated.  Pt was provided 180 minutes of non-face-to-face time during this encounter.   Shirley Guiles, LCSW   [facility] PHP THERAPIST PROGRESS NOTE  Shirley Nunez [id number]  Session Time: 9:00 - 10:00  Participation Level: Active  Behavioral Response: CasualAlertDepressed  Type of Therapy: Group Therapy  Treatment Goals addressed: Coping  Progress Towards Goals: Progressing  Interventions: CBT, DBT, Supportive, and Reframing  Summary: Shirley Nunez is a 47 y.o. female who presents with depression and trauma symptoms.  Clinician led check-in regarding current stressors and situation, and review of patient completed daily inventory. Clinician utilized active listening and empathetic response and validated patient emotions. Clinician facilitated processing group on pertinent issues.?    Therapist Response: Patient arrived within time allowed. Patient rates her mood at a 4 on a scale of 1-10 with 10 being best. Pt states she feels "fired up." Pt states she slept 6 hours and ate 1x. Pt reports she had to handle an issue at her daughter's school this morning which has  her running hot. Pt reports difficulty managing the angry feelings however was able to mitigate her response. Pt states she slept better because her medicine "knocked me out." Pt reports increased anxiety about her  new relationship. Patient able to process. Patient engaged in discussion.            Session Time: 10:00 am - 11:00 am   Participation Level: Active   Behavioral Response: CasualAlertDepressed   Type of Therapy: Group Therapy   Treatment Goals addressed: Coping   Progress Towards Goals: Progressing   Interventions: CBT, DBT, Solution Focused, Strength-based, Supportive, and Reframing   Therapist Response: Cln led processing group for pt's current struggles. Group members shared stressors and provided support and feedback. Cln brought in topics of boundaries, healthy relationships, and unhealthy thought processes to inform discussion.    Therapist Response: Pt able to process and provide support to group.            Session Time: 11:00 -12:00   Participation Level: Active   Behavioral Response: CasualAlertDepressed   Type of Therapy: Group Therapy, Spiritual Care   Treatment Goals addressed: Coping   Progress Towards Goals: Progressing   Interventions: Supportive, Education   Summary:  Shirley Nunez, Chaplain, led group.   Therapist Response: Pt participated      **Pt chose to leave group at 12 due to a childcare conflict.    Suicidal/Homicidal: Nowithout intent/plan  Plan: Pt will continue in PHP while working to decrease trauma and depression symptoms, increase daily functioning, and increase ability to manage symptoms in a healthy manner.   Collaboration of Care: Medication Management AEB J. McQuilla  Patient/Guardian was advised Release of  Information must be obtained prior to any record release in order to collaborate their care with an outside provider. Patient/Guardian was advised if they have not already done so to contact the registration  department to sign all necessary forms in order for [place] to release information regarding their care.   Consent: Patient/Guardian gives verbal consent for treatment and assignment of benefits for services provided during this visit. Patient/Guardian expressed understanding and agreed to proceed.   Diagnosis: Severe episode of recurrent major depressive disorder, without psychotic features (HCC) [F33.2]    1. Severe episode of recurrent major depressive disorder, without psychotic features (HCC)   2. PTSD (post-traumatic stress disorder)      Shirley Guiles, LCSW

## 2022-09-29 NOTE — Progress Notes (Signed)
Virtual Visit via Video Note   I connected with Shirley Nunez on 09/29/22 at  9:00 AM EDT by a video enabled telemedicine application and verified that I am speaking with the correct person using two identifiers.   At orientation to the IOP program, Case Manager discussed the limitations of evaluation and management by telemedicine and the availability of in person appointments. The patient expressed understanding and agreed to proceed with virtual visits throughout the duration of the program.   Location:  Patient: Patient Home Provider: [facility]   History of Present Illness: MDD and PTSD    Observations/Objective: Check In: Case Manager checked in with all participants to review discharge dates, insurance authorizations, work-related documents and needs from the treatment team regarding medications. Shirley Nunez stated needs and engaged in discussion.    Initial Therapeutic Activity: Counselor facilitated a check-in with Shirley Nunez to assess for safety, sobriety and medication compliance.  Counselor also inquired about Shirley Nunez's current emotional ratings, as well as any significant changes in thoughts, feelings or behavior since previous check in.  Shirley Nunez presented for session on time and was alert, oriented x5, with no evidence or self-report of active SI/HI or A/V H.  Shirley Nunez reported compliance with medication and denied use of alcohol or illicit substances.  Shirley Nunez reported scores of 6/10 for depression, 8/10 for anxiety, and 8/10 for irritability.  Shirley Nunez denied any recent outbursts or panic attacks.  Shirley Nunez reported that a recent success was completing PHP and making the decision to continue with group therapy via [facility].  Shirley Nunez reported that a recent struggle was having her son travel to [place] for graduation, and finding that she misses him, stating "Its hard to let go".  Shirley Nunez reported that her goal today is to pick up her daughter from school and then work on her to-do list.         Second  Therapeutic Activity: Counselor introduced topic of self-care today.  Counselor explained how this can be defined as the things one does to maintain good health and improve well-being.  Counselor provided members with a self-care assessment form to complete.  This handout featured various sub-categories of self-care, including physical, psychological/emotional, social, spiritual, and professional.  Members were asked to rank their engagement in the activities listed for each dimension on a scale of 1-3, with 1 indicating 'Poor', 2 indicating 'Ok', and 3 indicating 'Well'.  Counselor invited members to share results of their assessment, and inquired about which areas of self-care they are doing well in, as well as areas that require attention, and how they plan to begin addressing this during treatment.  Intervention was effective, as evidenced by Shirley Nunez successfully completing first section of assessment and actively engaging in discussion on subject, reporting that she is excelling in areas such as taking care of personal hygiene, but would benefit from focusing more on areas such as eating healthy foods, and resting when sick.  Shirley Nunez reported that she would work to improve self-care deficits by making healthier food choices such as including and avoiding fast foods, carbs, and fatty meats; and recognizing warning signs that could indicate that she needs to rest.  Shirley Nunez reported that she needed to leave group earlier than planned at 11:20am in order to assist her daughter.  Counselor informed care team of her early departure today.     Assessment and Plan: Counselor recommends that Shirley Nunez remain in IOP treatment to better manage mental health symptoms, ensure stability and pursue completion of treatment plan goals. Counselor  recommends adherence to crisis/safety plan, taking medications as prescribed, and following up with medical professionals if any issues arise.    Follow Up Instructions: Counselor will send  Webex link for session tomorrow.  Shirley Nunez was advised to call back or seek an in-person evaluation if the symptoms worsen or if the condition fails to improve as anticipated.   Collaboration of Care:   Medication Management AEB Dr. Eliseo Gum or Hillery Jacks, NP                                          Case Manager AEB Jeri Modena, CNA   Patient/Guardian was advised Release of Information must be obtained prior to any record release in order to collaborate their care with an outside provider. Patient/Guardian was advised if they have not already done so to contact the registration department to sign all necessary forms in order for [place] to release information regarding their care.   Consent: Patient/Guardian gives verbal consent for treatment and assignment of benefits for services provided during this visit. Patient/Guardian expressed understanding and agreed to proceed.  I provided 140 minutes of non-face-to-face time during this encounter.   Noralee Stain, LCSW, LCAS 09/29/22

## 2022-09-30 ENCOUNTER — Other Ambulatory Visit (HOSPITAL_COMMUNITY): Payer: BC Managed Care – PPO | Admitting: Licensed Clinical Social Worker

## 2022-09-30 ENCOUNTER — Telehealth (HOSPITAL_COMMUNITY): Payer: Self-pay | Admitting: Licensed Clinical Social Worker

## 2022-09-30 DIAGNOSIS — M9904 Segmental and somatic dysfunction of sacral region: Secondary | ICD-10-CM | POA: Diagnosis not present

## 2022-09-30 DIAGNOSIS — Z79899 Other long term (current) drug therapy: Secondary | ICD-10-CM | POA: Diagnosis not present

## 2022-09-30 DIAGNOSIS — F4310 Post-traumatic stress disorder, unspecified: Secondary | ICD-10-CM

## 2022-09-30 DIAGNOSIS — M9902 Segmental and somatic dysfunction of thoracic region: Secondary | ICD-10-CM | POA: Diagnosis not present

## 2022-09-30 DIAGNOSIS — F332 Major depressive disorder, recurrent severe without psychotic features: Secondary | ICD-10-CM | POA: Diagnosis not present

## 2022-09-30 DIAGNOSIS — F411 Generalized anxiety disorder: Secondary | ICD-10-CM | POA: Diagnosis not present

## 2022-09-30 DIAGNOSIS — M9903 Segmental and somatic dysfunction of lumbar region: Secondary | ICD-10-CM | POA: Diagnosis not present

## 2022-09-30 DIAGNOSIS — M9905 Segmental and somatic dysfunction of pelvic region: Secondary | ICD-10-CM | POA: Diagnosis not present

## 2022-09-30 NOTE — Progress Notes (Signed)
Virtual Visit via Video Note   I connected with Shirley Nunez on 09/30/22 at  9:00 AM EDT by a video enabled telemedicine application and verified that I am speaking with the correct person using two identifiers.   At orientation to the IOP program, Case Manager discussed the limitations of evaluation and management by telemedicine and the availability of in person appointments. The patient expressed understanding and agreed to proceed with virtual visits throughout the duration of the program.   Location:  Patient: Patient Home Provider: [facility]   History of Present Illness: MDD and PTSD    Observations/Objective: Check In: Case Manager checked in with all participants to review discharge dates, insurance authorizations, work-related documents and needs from the treatment team regarding medications. Shirley Nunez stated needs and engaged in discussion.    Initial Therapeutic Activity: Counselor facilitated a check-in with Shirley Nunez to assess for safety, sobriety and medication compliance.  Counselor also inquired about Shirley Nunez's current emotional ratings, as well as any significant changes in thoughts, feelings or behavior since previous check in.  Shirley Nunez presented for session on time and was alert, oriented x5, with no evidence or self-report of active SI/HI or A/V H.  Shirley Nunez reported compliance with medication and denied use of alcohol or illicit substances.  Shirley Nunez reported scores of 5/10 for depression, 8/10 for anxiety, and 0/10 for anger/irritability.  Shirley Nunez denied any recent outbursts or panic attacks.  Shirley Nunez reported that a recent struggle was receiving paperwork from her job which stressed her out this morning.  She reported that she also had to pick up her daughter earlier than planned yesterday due to an emergency, stating "There are so many things going on this year, it's a lot".  Shirley Nunez reported that a recent success was gaining insight into triggers, and practicing grounding activities to distract  herself when overwhelmed, like doing a Pilates routine.    Second Therapeutic Activity: Counselor introduced [employer], [place] Chaplain to provide psychoeducation on topic of Grief and Loss with members today.  Shirley Nunez began discussion by checking in with the group about their baseline mood today, general thoughts on what grief means to them and how it has affected them personally in the past.  Shirley Nunez provided information on how the process of grief/loss can differ depending upon one's unique culture, and categories of loss one could experience (i.e. loss of a person, animal, relationship, job, identity, etc).  Shirley Nunez encouraged members to be mindful of how pervasive loss can be, and how to recognize signs which could indicate that this is having an impact on one's overall mental health and wellbeing.  Intervention was effective, as evidenced by Shirley Nunez participating in discussion with speaker on the subject, reporting that this made her reflect upon the quality of various relationships in her life, and the struggle she has in setting boundaries with those that may not have her best interests in mind.  Shirley Nunez stated "I'm scare to lose people, but what they're doing isn't necessarily healthy for me".    Third Therapeutic Activity: Counselor also acknowledged a graduating group member by prompting this member to reflect on progress made since beginning the [facility] program, notable takeaways from sessions attended, challenges overcome, and plan for continued care following discharge. Counselor and group members shared observations of growth, words of encouragement and support as this member transitioned out of the program today.  Intervention was effective, as evidenced by Shirley Nunez participating in discussion with graduating member, reporting that she was jealous of how far  this member has come during treatment, and hopeful that she will be able to make as much progress if she commits to the process.  Shirley Nunez stated "I  want to get to that next step where you are and I'm excited about it.  I want that peace of mind moving forward".     Assessment and Plan: Counselor recommends that Shirley Nunez remain in IOP treatment to better manage mental health symptoms, ensure stability and pursue completion of treatment plan goals. Counselor recommends adherence to crisis/safety plan, taking medications as prescribed, and following up with medical professionals if any issues arise.    Follow Up Instructions: Counselor will send Webex link for session tomorrow.  Shirley Nunez was advised to call back or seek an in-person evaluation if the symptoms worsen or if the condition fails to improve as anticipated.   Collaboration of Care:   Medication Management AEB Dr. Eliseo Gum or Hillery Jacks, NP                                          Case Manager AEB Jeri Modena, CNA   Patient/Guardian was advised Release of Information must be obtained prior to any record release in order to collaborate their care with an outside provider. Patient/Guardian was advised if they have not already done so to contact the registration department to sign all necessary forms in order for [place] to release information regarding their care.   Consent: Patient/Guardian gives verbal consent for treatment and assignment of benefits for services provided during this visit. Patient/Guardian expressed understanding and agreed to proceed.  I provided 180 minutes of non-face-to-face time during this encounter.   Noralee Stain, LCSW, LCAS 09/30/22

## 2022-10-01 ENCOUNTER — Other Ambulatory Visit (HOSPITAL_COMMUNITY): Payer: BC Managed Care – PPO | Admitting: Licensed Clinical Social Worker

## 2022-10-01 DIAGNOSIS — F4310 Post-traumatic stress disorder, unspecified: Secondary | ICD-10-CM | POA: Diagnosis not present

## 2022-10-01 DIAGNOSIS — F411 Generalized anxiety disorder: Secondary | ICD-10-CM | POA: Diagnosis not present

## 2022-10-01 DIAGNOSIS — F332 Major depressive disorder, recurrent severe without psychotic features: Secondary | ICD-10-CM

## 2022-10-01 DIAGNOSIS — Z79899 Other long term (current) drug therapy: Secondary | ICD-10-CM | POA: Diagnosis not present

## 2022-10-01 NOTE — Progress Notes (Signed)
Virtual Visit via Video Note   I connected with Shirley Nunez on 10/01/22 at  9:00 AM EDT by a video enabled telemedicine application and verified that I am speaking with the correct person using two identifiers.   At orientation to the IOP program, Case Manager discussed the limitations of evaluation and management by telemedicine and the availability of in person appointments. The patient expressed understanding and agreed to proceed with virtual visits throughout the duration of the program.   Location:  Patient: Patient Home Provider: [facility]   History of Present Illness: MDD and PTSD    Observations/Objective: Check In: Case Manager checked in with all participants to review discharge dates, insurance authorizations, work-related documents and needs from the treatment team regarding medications. Shirley Nunez stated needs and engaged in discussion.    Initial Therapeutic Activity: Counselor facilitated a check-in with Shirley Nunez to assess for safety, sobriety and medication compliance.  Counselor also inquired about Shirley Nunez's current emotional ratings, as well as any significant changes in thoughts, feelings or behavior since previous check in.  Shirley Nunez presented for session on time and was alert, oriented x5, with no evidence or self-report of active SI/HI or A/V H.  Shirley Nunez reported compliance with medication and denied use of alcohol or illicit substances.  Shirley Nunez reported scores of 3/10 for depression, 3/10 for anxiety, and 7/10 for irritability.  Shirley Nunez denied any recent outbursts or panic attacks.  Shirley Nunez reported that a recent success was taking time to relax yesterday for self-care.  Shirley Nunez reported that an ongoing struggle is worrying about getting paperwork submitted with her job while on leave, although case manager was able to assist this morning with coordination.  Shirley Nunez reported that her goal today is to take her daughter to the pool to relax since the weather is nice.       Second Therapeutic  Activity: Counselor introduced Shirley Nunez, [employer] Pharmacist, to provide psychoeducation on topic of medication compliance with members today.  Shirley Nunez provided psychoeducation on classes of medications such as antidepressants, antipsychotics, what symptoms they are intended to treat, and any side effects one might encounter while on a particular prescription.  Time was allowed for clients to ask any questions they might have of Shirley Nunez regarding this specialty.  Intervention was effective, as evidenced by Shirley Nunez participating in discussion with speaker on the subject, reporting that she was concerned about potential side effects from taking a new prescription from her doctor along with OTC pain medications like Tylenol.  Shirley Nunez was appreciative of feedback from the pharmacist on typical dosage for this medication, and any complications that could result from pairing this with OTC products.    Third Therapeutic Activity: Counselor offered to teach group members an ACT relaxation technique today to aid in managing difficult thoughts, feelings, urges, and sensations.  Counselor guided members through process of getting comfortable, achieving relaxing breathing rhythm, and then maintaining this throughout activity.  Counselor invited members to imagine a gently flowing stream in their mind with leaves floating upon it, and when any thoughts, feelings, urges, or sensations arose, good or bad, they were instructed to visualize placing them on these passing leaves over course of 15 minutes practice.  Intervention was effective, as evidenced by Shirley Nunez successfully participating in activity and reporting that although it was hard for her to focus on the visual aspect of the exercise, she did enjoy focusing on her breathing.  She reported that she would consider practicing it again when less distracted, noting that she felt  physical relief from tension as well.    Assessment and Plan: Counselor recommends that Shirley Nunez remain in  IOP treatment to better manage mental health symptoms, ensure stability and pursue completion of treatment plan goals. Counselor recommends adherence to crisis/safety plan, taking medications as prescribed, and following up with medical professionals if any issues arise.    Follow Up Instructions: Counselor will send Webex link for session tomorrow.  Shirley Nunez was advised to call back or seek an in-person evaluation if the symptoms worsen or if the condition fails to improve as anticipated.   Collaboration of Care:   Medication Management AEB Dr. Eliseo Gum or Hillery Jacks, NP                                          Case Manager AEB Jeri Modena, CNA   Patient/Guardian was advised Release of Information must be obtained prior to any record release in order to collaborate their care with an outside provider. Patient/Guardian was advised if they have not already done so to contact the registration department to sign all necessary forms in order for [place] to release information regarding their care.   Consent: Patient/Guardian gives verbal consent for treatment and assignment of benefits for services provided during this visit. Patient/Guardian expressed understanding and agreed to proceed.  I provided 180 minutes of non-face-to-face time during this encounter.   Noralee Stain, LCSW, LCAS 10/01/22

## 2022-10-02 ENCOUNTER — Other Ambulatory Visit (HOSPITAL_COMMUNITY): Payer: BC Managed Care – PPO | Admitting: Licensed Clinical Social Worker

## 2022-10-02 DIAGNOSIS — F332 Major depressive disorder, recurrent severe without psychotic features: Secondary | ICD-10-CM | POA: Diagnosis not present

## 2022-10-02 DIAGNOSIS — F4310 Post-traumatic stress disorder, unspecified: Secondary | ICD-10-CM | POA: Diagnosis not present

## 2022-10-02 DIAGNOSIS — Z79899 Other long term (current) drug therapy: Secondary | ICD-10-CM | POA: Diagnosis not present

## 2022-10-02 DIAGNOSIS — F411 Generalized anxiety disorder: Secondary | ICD-10-CM | POA: Diagnosis not present
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
## 2022-10-03 ENCOUNTER — Other Ambulatory Visit (HOSPITAL_COMMUNITY): Payer: BC Managed Care – PPO | Admitting: Licensed Clinical Social Worker

## 2022-10-03 DIAGNOSIS — F332 Major depressive disorder, recurrent severe without psychotic features: Secondary | ICD-10-CM | POA: Diagnosis not present

## 2022-10-03 DIAGNOSIS — F411 Generalized anxiety disorder: Secondary | ICD-10-CM | POA: Diagnosis not present

## 2022-10-03 DIAGNOSIS — Z79899 Other long term (current) drug therapy: Secondary | ICD-10-CM | POA: Diagnosis not present

## 2022-10-03 DIAGNOSIS — F4310 Post-traumatic stress disorder, unspecified: Secondary | ICD-10-CM | POA: Diagnosis not present

## 2022-10-03 NOTE — Progress Notes (Signed)
Virtual Visit via Video Note   I connected with Shirley Nunez on 10/03/22 at  9:00 AM EDT by a video enabled telemedicine application and verified that I am speaking with the correct person using two identifiers.   At orientation to the IOP program, Case Manager discussed the limitations of evaluation and management by telemedicine and the availability of in person appointments. The patient expressed understanding and agreed to proceed with virtual visits throughout the duration of the program.   Location:  Patient: [facility] Home Provider: [facility]   History of Present Illness: MDD and PTSD    Observations/Objective: Check In: Case Manager checked in with all participants to review discharge dates, insurance authorizations, work-related documents and needs from the treatment team regarding medications. Shirley Nunez stated needs and engaged in discussion.    Initial Therapeutic Activity: Counselor facilitated a check-in with Shirley Nunez to assess for safety, sobriety and medication compliance.  Counselor also inquired about Shirley Nunez's current emotional ratings, as well as any significant changes in thoughts, feelings or behavior since previous check in.  Shirley Nunez presented for session on time and was alert, oriented x5, with no evidence or self-report of active SI/HI or A/V H.  Shirley Nunez reported compliance with medication and denied use of alcohol or illicit substances.  Shirley Nunez reported scores of 1/10 for depression, 0/10 for anxiety, and 0/10 for anger/irritability.  Shirley Nunez denied any recent outbursts or panic attacks.  Shirley Nunez reported that a recent success was getting a call back from [facility] about services that she could engage in after [facility] ends.  She reported that she also cooked a nice meal for the family yesterday.  Shirley Nunez denied any new struggles.  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       Second Therapeutic Activity:  Counselor covered topic of core beliefs with group today.  Counselor virtually shared a handout on the subject, which explained how everyone looks at the world differently, and two people can have the same experience, but have different interpretations of what happened.  Members were encouraged to think of these like sunglasses with different "shades" influencing perception towards positive or negative outcomes.  Examples of negative core beliefs were provided, such as "I'm unlovable", "I'm not good enough", and "I'm a bad person".  Members were asked to share which one(s) they could relate to, and then identify evidence which contradicts these beliefs.  Counselor also provided psychoeducation on positive affirmations today.  Counselor explained how these are positive statements which can be spoken out loud or recited mentally to challenge negative thoughts and/or core beliefs to improve mood and outlook each day.  Counselor shared a comprehensive list of affirmations virtually to members with different categories, including ones for health, confidence, success, and happiness.  Counselor invited members to look through this list and identify any which resonated with them, and practice saying them out loud with sincerity.  Intervention was effective, as evidenced by Shirley Nunez successfully participating in discussion on the subject and reporting that she could relate to several negative core beliefs listed on the handout, such as "I am a failure", "I'm not worthy", and "I am trapped".  Shirley Nunez was able to successfully challenge the core belief "I am weak" by identifying evidence that contracted it, reporting that she is able to care for herself and children each day, she has been able to help others with her experiences, and she has been able  to overcome challenges from her childhood that would have been insurmountable to others in the same situation.  Shirley Nunez also reported that she liked several of the positive affirmations  listed, such as "I embrace my flaws because I know that nobody is perfect", "I press on because I believe in my path", and "I am confident about solving life's problems successfully".    Assessment and Plan: Counselor recommends that Shirley Nunez remain in IOP treatment to better manage mental health symptoms, ensure stability and pursue completion of treatment plan goals. Counselor recommends adherence to crisis/safety plan, taking medications as prescribed, and following up with medical professionals if any issues arise.    Follow Up Instructions: Counselor will send Webex link for session tomorrow.  Shirley Nunez was advised to call back or seek an in-person evaluation if the symptoms worsen or if the condition fails to improve as anticipated.   Collaboration of Care:   Medication Management AEB Dr. Eliseo Gum or Hillery Jacks, NP                                          Case Manager AEB Jeri Modena, CNA   Patient/Guardian was advised Release of Information must be obtained prior to any record release in order to collaborate their care with an outside provider. Patient/Guardian was advised if they have not already done so to contact the registration department to sign all necessary forms in order for [place] to release information regarding their care.   Consent: Patient/Guardian gives verbal consent for treatment and assignment of benefits for services provided during this visit. Patient/Guardian expressed understanding and agreed to proceed.  I provided 180 minutes of non-face-to-face time during this encounter.   Noralee Stain, LCSW, LCAS 10/03/22

## 2022-10-07 ENCOUNTER — Other Ambulatory Visit (HOSPITAL_COMMUNITY): Payer: BC Managed Care – PPO | Admitting: Psychiatry

## 2022-10-07 ENCOUNTER — Telehealth (HOSPITAL_COMMUNITY): Payer: Self-pay | Admitting: Psychiatry

## 2022-10-08 ENCOUNTER — Telehealth (HOSPITAL_COMMUNITY): Payer: Self-pay | Admitting: Psychiatry

## 2022-10-08 ENCOUNTER — Other Ambulatory Visit (HOSPITAL_COMMUNITY): Payer: BC Managed Care – PPO | Admitting: Licensed Clinical Social Worker

## 2022-10-08 DIAGNOSIS — M9904 Segmental and somatic dysfunction of sacral region: Secondary | ICD-10-CM | POA: Diagnosis not present

## 2022-10-08 DIAGNOSIS — M9903 Segmental and somatic dysfunction of lumbar region: Secondary | ICD-10-CM | POA: Diagnosis not present

## 2022-10-08 DIAGNOSIS — F4310 Post-traumatic stress disorder, unspecified: Secondary | ICD-10-CM | POA: Diagnosis not present

## 2022-10-08 DIAGNOSIS — M9902 Segmental and somatic dysfunction of thoracic region: Secondary | ICD-10-CM | POA: Diagnosis not present

## 2022-10-08 DIAGNOSIS — F332 Major depressive disorder, recurrent severe without psychotic features: Secondary | ICD-10-CM

## 2022-10-08 DIAGNOSIS — F411 Generalized anxiety disorder: Secondary | ICD-10-CM | POA: Diagnosis not present

## 2022-10-08 DIAGNOSIS — Z79899 Other long term (current) drug therapy: Secondary | ICD-10-CM | POA: Diagnosis not present

## 2022-10-08 DIAGNOSIS — M9905 Segmental and somatic dysfunction of pelvic region: Secondary | ICD-10-CM | POA: Diagnosis not present

## 2022-10-08 NOTE — Progress Notes (Signed)
Virtual Visit via Video Note   I connected with Cristi Z. Botkin on 10/08/22 at  9:00 AM EDT by a video enabled telemedicine application and verified that I am speaking with the correct person using two identifiers.   At orientation to the IOP program, Case Manager discussed the limitations of evaluation and management by telemedicine and the availability of in person appointments. The patient expressed understanding and agreed to proceed with virtual visits throughout the duration of the program.   Location:  Patient: Patient Home Provider: [facility]   History of Present Illness: MDD and PTSD    Observations/Objective: Check In: Case Manager checked in with all participants to review discharge dates, insurance authorizations, work-related documents and needs from the treatment team regarding medications. Burke stated needs and engaged in discussion.    Initial Therapeutic Activity: Counselor facilitated a check-in with Shayona to assess for safety, sobriety and medication compliance.  Counselor also inquired about Makynzee's current emotional ratings, as well as any significant changes in thoughts, feelings or behavior since previous check in.  Aylana presented for session on time and was alert, oriented x5, with no evidence or self-report of active SI/HI or A/V H.  Chrystina reported compliance with medication and denied use of alcohol or illicit substances.  Filza reported scores of 9/10 for depression, 8/10 for anxiety, and 8/10 for irritability.  Future denied any recent panic attacks.  Jalayna reported that a recent struggle has been trying to manage daily tasks, including planning for her son's graduation, which has been overwhelming at times.  She reported that she has also been having trouble with her relatives, stating "I did cuss them out the other day".  Claree reported that her goal today is to set a boundary with family to avoid further conflict, and take time to relax at the pool for self-care.        Second Therapeutic Activity: Counselor introduced topic of stress management today.  Counselor provided definition of stress as feeling tense, overwhelmed, worn out, and/or exhausted, and noted that in small amounts, stress can be motivating until things become too overwhelming to manage.  Counselor also explained how stress can be acute (brief but intense) or chronic (long-lasting) and this can impact the severity of symptoms one can experience in the physical, emotional, and behavioral categories.  Counselor inquired about members' specific stressors, how long they have been prevalent, and the various symptoms that tend to manifest as a result.  Counselor also offered several stress management strategies to help improve members' coping ability, including journaling, gratitude practice, relaxation techniques, and time management tips.  Counselor also explained that research has shown a strong support network composed of trusted family, friends, or community members can increase resilience in times of stress, and inquired about who members can reach out to for help in managing stressors.  Counselor encouraged members to consider discussing stressor 'red flags' with their close supports that can be monitored and strategies for assisting them in times of crisis.  Intervention was effective, as evidenced by [name] actively participating in discussion on subject, reporting that her most significant stressors include family conflict, changing jobs, lack of confidence, money worries, loneliness, pain and fatigue.  Jemmie was able to identify several warning signs related to stress, including sweating, shaking, and headaches.  Damonie reported that her stress management goal is to keep her average stress level at 5/10 or below in severity for at least one week by setting heathier boundaries with her support network to  ensure less exposure to disrespectful people.  Dasani also expressed receptiveness to several stress  management strategies practiced today in session, including taking time outs when needed to relax, practicing a deep breathing exercise, using a stress tracker tool, and mindful meditation.     Assessment and Plan: Counselor recommends that Vanetta remain in IOP treatment to better manage mental health symptoms, ensure stability and pursue completion of treatment plan goals. Counselor recommends adherence to crisis/safety plan, taking medications as prescribed, and following up with medical professionals if any issues arise.    Follow Up Instructions: Counselor will send Webex link for session tomorrow.  Zahia was advised to call back or seek an in-person evaluation if the symptoms worsen or if the condition fails to improve as anticipated.   Collaboration of Care:   Medication Management AEB Dr. Eliseo Gum or Hillery Jacks, NP                                          Case Manager AEB Jeri Modena, CNA   Patient/Guardian was advised Release of Information must be obtained prior to any record release in order to collaborate their care with an outside provider. Patient/Guardian was advised if they have not already done so to contact the registration department to sign all necessary forms in order for [place] to release information regarding their care.   Consent: Patient/Guardian gives verbal consent for treatment and assignment of benefits for services provided during this visit. Patient/Guardian expressed understanding and agreed to proceed.  I provided 180 minutes of non-face-to-face time during this encounter.   Noralee Stain, LCSW, LCAS 10/08/22

## 2022-10-08 NOTE — Telephone Encounter (Signed)
D:  Pt reached out to the MH-IOP case manager.  Pt states she hasn't been honest with staff re: SI.  "I've always had SI, but I just didn't want to admit in front of everyone.  Everyone else would state that they didn't have SI, so I would just go ahead and state that I didn't have them."  Pt admits to passive SI; but denies a plan or intent.  States she used to have them frequently during the day, but a thought may come and go during the day now.  A:  Provided pt with support.  Praised pt for being honest.  Inquired if pt felt she needed a higher level of care (ie Inpatient); pt declined; stating "it's not that bad."  Discussed safety options at length with pt.  Provided her with [facility] and 988 phone numbers.  Inform treatment team.  R:  Pt receptive.

## 2022-10-09 ENCOUNTER — Other Ambulatory Visit (HOSPITAL_COMMUNITY): Payer: BC Managed Care – PPO | Admitting: Family

## 2022-10-09 DIAGNOSIS — F411 Generalized anxiety disorder: Secondary | ICD-10-CM | POA: Diagnosis not present

## 2022-10-09 DIAGNOSIS — F4310 Post-traumatic stress disorder, unspecified: Secondary | ICD-10-CM

## 2022-10-09 DIAGNOSIS — F332 Major depressive disorder, recurrent severe without psychotic features: Secondary | ICD-10-CM

## 2022-10-09 DIAGNOSIS — Z79899 Other long term (current) drug therapy: Secondary | ICD-10-CM | POA: Diagnosis not present

## 2022-10-09 MED ORDER — HYDROXYZINE HCL 25 MG PO TABS: 25.0000 mg | ORAL_TABLET | Freq: Every evening | ORAL | 0 refills | Status: DC | PRN

## 2022-10-09 NOTE — Progress Notes (Signed)
Virtual Visit via Video Note   I connected with Shirley Nunez on 10/09/22 at  9:00 AM EDT by a video enabled telemedicine application and verified that I am speaking with the correct person using two identifiers.   At orientation to the IOP program, Case Manager discussed the limitations of evaluation and management by telemedicine and the availability of in person appointments. The patient expressed understanding and agreed to proceed with virtual visits throughout the duration of the program.   Location:  Patient: [facility] Home [facility]: [facility]   History of Present Illness: MDD and PTSD    Observations/Objective: Check In: Case Manager checked in with all participants to review discharge dates, insurance authorizations, work-related documents and needs from the treatment team regarding medications. Gurtha stated needs and engaged in discussion.    Initial Therapeutic Activity: Counselor facilitated a check-in with Delphina to assess for safety, sobriety and medication compliance.  Counselor also inquired about Adea's current emotional ratings, as well as any significant changes in thoughts, feelings or behavior since previous check in.  Texie presented for session on time and was alert, oriented x5, with no evidence or self-report of active SI/HI or A/V H.  Mikayla reported compliance with medication and denied use of alcohol or illicit substances.  Edytha reported scores of 6/10 for depression, 9/10 for anxiety, and 0/10 for anger/irritability.  Kentra denied any recent outbursts or panic attacks.  Malorey reported that a recent success was taking a walk yesterday afternoon for self-care, stating "I went almost 2 hours and even listened to a meditation".  Myndee reported that a recent struggle was experiencing irritability last night when having problems with her son's father.  Jency reported that her goal today is to attend graduation for her son, which she is anxious about.       Second  Therapeutic Activity: Counselor introduced topic of assertive communication today.  Counselor shared various handouts with members virtually in group to read along with on the subject.  These handouts defined assertive communication as a communication style in which a person stands up for their own needs and wants, while also taking into consideration the needs and wants of others, without behaving in a passive or aggressive way.  Traits of assertive communicators were highlighted such as using appropriate speaking volume, maintaining eye contact, using confident language, and avoiding interruption.  Members were also provided with tips on how to improve communication, including respecting oneself, expressing thoughts and feelings calmly, and saying "No" when necessary.  Members were given a variety of scenarios where they could practice using these tips to respond in an assertive manner.  Intervention was effective, as evidenced by [name] participating in discussion on topic, reporting that she has a passive/aggressive communication style due to traits such as not expressing her own needs or wants, becoming easily frustrated, speaking in a loud or overbearing way, and lacking confidence. Corrissa showed more effective use of assertive communication skills through engagement in roleplay activities.    Assessment and Plan: Counselor recommends that Charie remain in IOP treatment to better manage mental health symptoms, ensure stability and pursue completion of treatment plan goals. Counselor recommends adherence to crisis/safety plan, taking medications as prescribed, and following up with medical professionals if any issues arise.    Follow Up Instructions: Counselor will send Webex link for session tomorrow.  Lucile was advised to call back or seek an in-person evaluation if the symptoms worsen or if the condition fails to improve as anticipated.  Collaboration of Care:   Medication Management AEB Dr. Eliseo Gum or Hillery Jacks, NP                                          Case Manager AEB Jeri Modena, CNA   Patient/Guardian was advised Release of Information must be obtained prior to any record release in order to collaborate their care with an outside provider. Patient/Guardian was advised if they have not already done so to contact the registration department to sign all necessary forms in order for [place] to release information regarding their care.   Consent: Patient/Guardian gives verbal consent for treatment and assignment of benefits for services provided during this visit. Patient/Guardian expressed understanding and agreed to proceed.  I provided 180 minutes of non-face-to-face time during this encounter.   Noralee Stain, LCSW, LCAS 10/09/22

## 2022-10-09 NOTE — Progress Notes (Signed)
Virtual Visit via Telephone Note  I connected with Nasreen Garrand Mckeen on 10/11/22 at  9:00 AM EDT by telephone and verified that I am speaking with the correct person using two identifiers.  Location: Patient: Home Provider:  Office   I discussed the limitations, risks, security and privacy concerns of performing an evaluation and management service by telephone and the availability of in person appointments. I also discussed with the patient that there may be a patient responsible charge related to this service. The patient expressed understanding and agreed to proceed.    I discussed the assessment and treatment plan with the patient. The patient was provided an opportunity to ask questions and all were answered. The patient agreed with the plan and demonstrated an understanding of the instructions.   The patient was advised to call back or seek an in-person evaluation if the symptoms worsen or if the condition fails to improve as anticipated.  I provided 15 minutes of non-face-to-face time during this encounter.   Oneta Rack, NP   [facility] MD/PA/NP OP Progress Note  10/11/2022 8:18 AM Favour Keegan Matto  MRN:  [id number]  Chief Complaint:  " I am gaining too much weight on this medication."  HPI: Nadeline Hoben is a 47 year old female that was admitted to intensive outpatient programming due to worsening depression.  She has a past medical history of posttraumatic stress disorder, major depressive disorder, generalized anxiety disorder.  She is currently prescribed Remeron 15 mg p.o. nightly.  She reports while she is taking Remeron as indicated. She reported a 8 to 10 pound weight gain.  States that her sleep has improved with medication.  Patient is inquiring about alternate medications.  Restarting hydroxyzine 25 mg may increase to 50 mg nightly.  And or consider using over-the-counter melatonin.  Patient appeared receptive to plan.  Patient to discontinue Remeron.  Will follow-up with symptom  management and medication efficacy.   Patient reported she has tried multiple medication in the past. Support, encouragement  and reassurance was provided.   During evaluation Deeanne Z Reppucci  is alert/oriented x 4; calm/cooperative; and mood congruent with affect.  Patient is speaking in a clear tone at moderate volume, and normal pace; Her thought process is coherent and relevant.  Patient denies suicidal/self-harm/homicidal ideation, psychosis, and paranoia.  Patient has remained calm throughout assessment and has answered questions appropriately.    Visit Diagnosis:    ICD-10-CM   1. PTSD (post-traumatic stress disorder)  F43.10 hydrOXYzine (ATARAX) 25 MG tablet    2. Severe episode of recurrent major depressive disorder, without psychotic features (HCC)  F33.2 hydrOXYzine (ATARAX) 25 MG tablet      Past Psychiatric History:   Past Medical History:  Past Medical History:  Diagnosis Date   Anxiety    Depression    HSV infection    on daily medication   Migraine    Sickle cell anemia (HCC)    Sickle cell trait    Past Surgical History:  Procedure Laterality Date   ENDOMETRIAL ABLATION     WISDOM TOOTH EXTRACTION      Family Psychiatric History:   Family History:  Family History  Problem Relation Age of Onset   Hypertension Mother    Depression Mother    Miscarriages / Stillbirths Mother    Hypertension Father    Hyperlipidemia Father    Diabetes Father    Arthritis Father    Stroke Father    Sickle cell anemia Sister    Alcohol  abuse Maternal Grandmother    Arthritis Maternal Grandmother    Depression Maternal Grandmother    Diabetes Maternal Grandmother    Heart attack Maternal Grandmother    Hypertension Maternal Grandmother    Stroke Maternal Grandmother    Heart attack Maternal Grandfather    Lung cancer Paternal Grandmother        mets to brain   Alcohol abuse Paternal Grandfather    Heart attack Paternal Grandfather    Hypertension Paternal Grandfather     Stroke Paternal Grandfather    Colon cancer Neg Hx     Social History:  Social History   Socioeconomic History   Marital status: Single    Spouse name: Not on file   Number of children: 2   Years of education: Not on file   Highest education level: Associate degree: occupational, Scientist, product/process development, or vocational program  Occupational History   Not on file  Tobacco Use   Smoking status: Never   Smokeless tobacco: Never  Vaping Use   Vaping Use: Never used  Substance and Sexual Activity   Alcohol use: Yes    Alcohol/week: 2.0 standard drinks of alcohol    Types: 2 Glasses of wine per week    Comment: 2 x / month   Drug use: No   Sexual activity: Yes    Birth control/protection: Other-see comments, None    Comment: ablation  Other Topics Concern   Not on file  Social History Narrative   Sales promotion account executive at [employer]    Married   2 kids   Likes: writing and reading   One of her sons passed away 16 years ago   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, 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   Social Determinants of Health   Financial Resource Strain: Medium Risk (07/31/2022)   Overall Financial Resource Strain (CARDIA)    Difficulty of Paying Living Expenses: Somewhat hard  Food Insecurity: Food Insecurity Present (07/31/2022)   Hunger Vital Sign    Worried About Running Out of Food in the Last Year: Often true    Ran Out of Food in the Last Year: Sometimes true  Transportation Needs: No Transportation Needs (07/31/2022)   PRAPARE - Administrator, Civil Service (Medical): No    Lack of Transportation (Non-Medical): No  Physical Activity: Insufficiently Active (07/31/2022)   Exercise Vital Sign    Days of Exercise per Week: 3 days    Minutes of Exercise per Session: 30 min  Stress: Stress Concern Present (07/31/2022)   [employer] of Occupational Health - Occupational Stress Questionnaire    Feeling of Stress : Very much  Social Connections: Socially Isolated  (07/31/2022)   Social Connection and Isolation Panel [NHANES]    Frequency of Communication with Friends and Family: Once a week    Frequency of Social Gatherings with Friends and Family: Never    Attends Religious Services: 1 to 4 times per year    Active Member of [employer] or Organizations: No    Attends Engineer, structural: Not on file    Marital Status: Divorced    Allergies:  Allergies  Allergen Reactions   Mushroom Extract Complex Rash   Penicillins Rash    Metabolic Disorder Labs: No results found for: "HGBA1C", "MPG" No results found for: "PROLACTIN" Lab Results  Component Value Date   CHOL 196 07/09/2021   TRIG 40.0 07/09/2021   HDL 83.00 07/09/2021   CHOLHDL 2 07/09/2021   VLDL 8.0 07/09/2021  LDLCALC 105 (H) 07/09/2021   LDLCALC 94 09/14/2019   Lab Results  Component Value Date   TSH 0.84 09/27/2020   TSH 1.00 12/17/2017    Therapeutic Level Labs: No results found for: "LITHIUM" No results found for: "VALPROATE" No results found for: "CBMZ"  Current Medications: Current Outpatient Medications  Medication Sig Dispense Refill   diazepam (VALIUM) 5 MG tablet Take 1 tablet (5 mg total) by mouth every 12 (twelve) hours as needed for anxiety. 30 tablet 1   hydrochlorothiazide (HYDRODIURIL) 25 MG tablet Take 1 tablet (25 mg total) by mouth daily as needed. 90 tablet 3   hydrOXYzine (ATARAX) 25 MG tablet Take 1 tablet (25 mg total) by mouth at bedtime and may repeat dose one time if needed. (Patient not taking: Reported on 10/10/2022) 30 tablet 0   mirtazapine (REMERON) 15 MG tablet Take 1 tablet (15 mg total) by mouth at bedtime. 30 tablet 0   pantoprazole (PROTONIX) 40 MG tablet Take 1 tablet (40 mg total) by mouth daily. 30 tablet 0   valACYclovir (VALTREX) 500 MG tablet Take 1 tablet (500 mg total) by mouth daily. 90 tablet 2   No current facility-administered medications for this visit.     Musculoskeletal:   Psychiatric Specialty Exam: Review  of Systems  Psychiatric/Behavioral:  The patient is nervous/anxious.   All other systems reviewed and are negative.   There were no vitals taken for this visit.There is no height or weight on file to calculate BMI.  General Appearance: NA  Eye Contact:  NA  Speech:  Clear and Coherent  Volume:  Normal  Mood:  Anxious  Affect:  Congruent  Thought Process:  Coherent  Orientation:  Full (Time, Place, and Person)  Thought Content: Logical   Suicidal Thoughts:  No  Homicidal Thoughts:  No  Memory:  Immediate;   Good Recent;   Good  Judgement:  Good  Insight:  Fair  Psychomotor Activity:  NA  Concentration:  Concentration: Fair  Recall:  Good  Fund of Knowledge: Good  Language: Good  Akathisia:  NA  Handed:  Right  AIMS (if indicated): not done  Assets:  Desire for Improvement  ADL's:  Intact  Cognition: WNL  Sleep:  Good   Screenings: GAD-7    Flowsheet Row Office Visit from 07/31/2022 in [facility] from 06/11/2022 in [facility] from 11/19/2021 in [facility] from 07/09/2021 in [facility] from 03/27/2021 in [facility]  Total GAD-7 Score 20 18 11 17 5       PHQ2-9    Flowsheet Row Counselor from 08/29/2022 in [facility] Counselor from 08/21/2022 in [facility]AL [facility]PITALIZATION PROGRAM Office Visit from 07/31/2022 in [facility] from 07/07/2022 in [facility] from 06/11/2022 in [facility]  PHQ-2 Total Score 6 5 6 5 5   PHQ-9 Total Score 24 21 22 21 21       Flowsheet Row Counselor from 08/29/2022 in [facility]HAVIORAL [facility] PARTIAL [facility]OSPITALIZATION PROGRAM Counselor from 08/21/2022 in BEHAVIORAL HEALTH PARTIAL HOSPITALIZATION PROGRAM ED from 11/14/2021 in [facility] Emergency  Department at [facility]  C-SSRS RISK CATEGORY No Risk Low Risk No Risk        Assessment and Plan:  Continue to intensive outpatient programming (IOP) Consider follow-up with sleep study (polysomnography)  Restarted hydroxyzine 25 mg p.o. as needed  nightly may repeat Discontinue Remeron 15 mg nightly   Collaboration of Care: Collaboration of Care: Medication Management AEB restarted hydroxyzine 25 mg medication sent to local pharmacy  Patient/Guardian was advised Release of Information must be obtained prior to any record release in order to collaborate their care with an outside provider. Patient/Guardian was advised if they have not already done so to contact the registration department to sign all necessary forms in order for [place] to release information regarding their care.   Consent: Patient/Guardian gives verbal consent for treatment and assignment of benefits for services provided during this visit. Patient/Guardian expressed understanding and agreed to proceed.    Oneta Rack, NP 10/11/2022, 8:18 AM

## 2022-10-10 ENCOUNTER — Ambulatory Visit: Payer: BC Managed Care – PPO | Admitting: Physician Assistant

## 2022-10-10 ENCOUNTER — Encounter: Payer: Self-pay | Admitting: Physician Assistant

## 2022-10-10 ENCOUNTER — Other Ambulatory Visit (HOSPITAL_COMMUNITY): Payer: BC Managed Care – PPO | Admitting: Licensed Clinical Social Worker

## 2022-10-10 VITALS — BP 136/98 | HR 92 | Temp 97.3°F | Ht 66.0 in | Wt 155.2 lb

## 2022-10-10 DIAGNOSIS — F332 Major depressive disorder, recurrent severe without psychotic features: Secondary | ICD-10-CM | POA: Diagnosis not present

## 2022-10-10 DIAGNOSIS — R1010 Upper abdominal pain, unspecified: Secondary | ICD-10-CM | POA: Diagnosis not present

## 2022-10-10 DIAGNOSIS — F411 Generalized anxiety disorder: Secondary | ICD-10-CM | POA: Diagnosis not present

## 2022-10-10 DIAGNOSIS — F4310 Post-traumatic stress disorder, unspecified: Secondary | ICD-10-CM | POA: Diagnosis not present

## 2022-10-10 DIAGNOSIS — I10 Essential (primary) hypertension: Secondary | ICD-10-CM | POA: Diagnosis not present

## 2022-10-10 DIAGNOSIS — Z79899 Other long term (current) drug therapy: Secondary | ICD-10-CM | POA: Diagnosis not present

## 2022-10-10 MED ORDER — PANTOPRAZOLE SODIUM 40 MG PO TBEC: 40.0000 mg | DELAYED_RELEASE_TABLET | Freq: Every day | ORAL | 0 refills | Status: DC

## 2022-10-10 NOTE — Patient Instructions (Signed)
It was great to see you!  H Pylori test today  Continue to keep an eye on your blood pressure and message me if you want to do anything about it  Start protonix now to calm down stomach irritation    Take care,  Jarold Motto PA-C
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
## 2022-10-10 NOTE — Psych (Signed)
Virtual Visit via Video Note  I connected with Shirley Nunez on 09/19/22 at  9:00 AM EDT by a video enabled telemedicine application and verified that I am speaking with the correct person using two identifiers.  Location: Patient: patient home Provider: clinical home office   I discussed the limitations of evaluation and management by telemedicine and the availability of in person appointments. The patient expressed understanding and agreed to proceed.  I discussed the assessment and treatment plan with the patient. The patient was provided an opportunity to ask questions and all were answered. The patient agreed with the plan and demonstrated an understanding of the instructions.   The patient was advised to call back or seek an in-person evaluation if the symptoms worsen or if the condition fails to improve as anticipated.  Pt was provided 240 minutes of non-face-to-face time during this encounter.   Shirley Guiles, LCSW   [facility] BH PHP THERAPIST PROGRESS NOTE  Shirley Nunez [id number]  Session Time: 9:00 - 10:00  Participation Level: Active  Behavioral Response: CasualAlertDepressed  Type of Therapy: Group Therapy  Treatment Goals addressed: Coping  Progress Towards Goals: Progressing  Interventions: CBT, DBT, Supportive, and Reframing  Summary: Shirley Nunez is a 47 y.o. female who presents with depression and trauma symptoms.  Clinician led check-in regarding current stressors and situation, and review of patient completed daily inventory. Clinician utilized active listening and empathetic response and validated patient emotions. Clinician facilitated processing group on pertinent issues.?    Therapist Response: Patient arrived within time allowed. Patient rates her mood at a 6.5 on a scale of 1-10 with 10 being best. Pt states she feels "okay." Pt states she slept 6 hours and ate 2x. Pt reports feeling better today and is able to identify that she was in her feelings  yesterday. Pt states she helped at her church yesterday and spent time with her female friend. Pt shares feeling more capable and confident in herself. Pt reports continued nerves about discharge however recognizes it is good to progress.  Patient able to process. Patient engaged in discussion.            Session Time: 10:00 am - 11:00 am   Participation Level: Active   Behavioral Response: CasualAlertDepressed   Type of Therapy: Group Therapy   Treatment Goals addressed: Coping   Progress Towards Goals: Progressing   Interventions: CBT, DBT, Solution Focused, Strength-based, Supportive, and Reframing   Therapist Response: Cln led discussion on the holiday this weekend, Mother's Day, and held space for group members to process plans, feelings, and worries regarding the upcoming holiday. Cln brought in DBT ACCEPTS skills, boundaries, CBT thought challenging, and planning ahead to inform discussion when appropriate.    Therapist Response: Pt engaged in discussion and is able to process feelings around the holiday. Pt reports largely negative feelings about Mother's Day and is viewing it as "something to get through."         Session Time: 11:00 -12:00   Participation Level: Active   Behavioral Response: CasualAlertDepressed   Type of Therapy: Group Therapy   Treatment Goals addressed: Coping   Progress Towards Goals: Progressing   Interventions: CBT, DBT, Solution Focused, Strength-based, Supportive, and Reframing   Summary: Cln led discussion on ways to manage stressors and feelings over the weekend. Group members  brainstormed things to do over the weekend for multiple levels of energy, access, and moods. Cln reviewed crisis services should they be needed and provided pt's with the  text crisis line, mobile crisis, national suicide hotline, [facility] 24/7 line, and information on [facility].      Therapist Response:  Pt engaged in discussion and is able to identify 3 ideas of  what to do over the weekend to keep their mind engaged.          Session Time: 12:00 -1:00   Participation Level: Active   Behavioral Response: CasualAlertDepressed   Type of Therapy: Group therapy, Occupational Therapy   Treatment Goals addressed: Coping   Progress Towards Goals: Progressing   Interventions: Supportive; Psychoeducation   Summary: 12:00 - 12:50: Occupational Therapy group led by cln Shirley Nunez. 12:50 - 1:00 Clinician assessed for immediate needs, medication compliance and efficacy, and safety concerns.   Therapist Response: 12:00 - 12:50: See OT note 12:50 - 1:00 pm: At check-out, patient reports no immediate concerns. Patient demonstrates progress as evidenced by continued engagement and responsiveness to treatment. Patient denies SI/HI/self-harm thoughts at the end of group.      Suicidal/Homicidal: Nowithout intent/plan  Plan: Pt will discharge from PHP due to meeting treatment goals of decreased trauma and depression symptoms, increased daily functioning, and increased ability to manage symptoms in a healthy manner. Pt will step down to IOP within this agency beginning on 5/15. Pt and provider are aligned with discharge plan. Pt denies SI/HI at time of discharge.   Collaboration of Care: Medication Management AEB Shirley Nunez  Patient/Guardian was advised Release of Information must be obtained prior to any record release in order to collaborate their care with an outside provider. Patient/Guardian was advised if they have not already done so to contact the registration department to sign all necessary forms in order for [place] to release information regarding their care.   Consent: Patient/Guardian gives verbal consent for treatment and assignment of benefits for services provided during this visit. Patient/Guardian expressed understanding and agreed to proceed.   Diagnosis: Severe episode of recurrent major depressive disorder, without psychotic features (HCC)  [F33.2]    1. Severe episode of recurrent major depressive disorder, without psychotic features (HCC)   2. PTSD (post-traumatic stress disorder)      Shirley Guiles, LCSW

## 2022-10-10 NOTE — Progress Notes (Signed)
Virtual Visit via Video Note   I connected with Shirley Nunez on 10/10/22 at  9:00 AM EDT by a video enabled telemedicine application and verified that I am speaking with the correct person using two identifiers.   At orientation to the IOP program, Case Manager discussed the limitations of evaluation and management by telemedicine and the availability of in person appointments. The patient expressed understanding and agreed to proceed with virtual visits throughout the duration of the program.   Location:  Patient: Patient Home Provider: [facility]   History of Present Illness: MDD and PTSD    Observations/Objective: Check In: Case Manager checked in with all participants to review discharge dates, insurance authorizations, work-related documents and needs from the treatment team regarding medications. Shirley Nunez stated needs and engaged in discussion.    Initial Therapeutic Activity: Counselor facilitated a check-in with Shirley Nunez to assess for safety, sobriety and medication compliance.  Counselor also inquired about Shirley Nunez's current emotional ratings, as well as any significant changes in thoughts, feelings or behavior since previous check in.  Shirley Nunez presented for session on time and was alert, oriented x5, with no evidence or self-report of active SI/HI or A/V H.  Shirley Nunez reported compliance with medication and denied use of alcohol or illicit substances.  Shirley Nunez reported scores of 3/10 for depression, 4/10 for anxiety, and 0/10 for anger/irritability.  Shirley Nunez denied any recent outbursts.  Shirley Nunez reported that a recent success was enjoying her son's graduation with family yesterday, stating "It was so amazing.  My son even won an award".  Shirley Nunez reported that a recent struggle was having a panic attack yesterday evening when she was running around trying to prepare her son for this event.  Shirley Nunez reported that her goal today is to attend a doctor's appointment, and then relax at the pool afterward.        Second Therapeutic Activity: Psycho-educational portion of group was provided by [name] Evener, Interior and spatial designer of community education with [employer].  Shirley Nunez provided information on history of her local agency, mission statement, and the variety of unique services offered which group members might find beneficial to engage in, including both virtual and in-person support groups, as well as peer support program for mentoring.  Shirley Nunez offered time to answer member's questions regarding services and encouraged them to consider utilizing these services to assist in working towards their individual wellness goals.  Intervention effectiveness could not be measured, as Shirley Nunez left at breaktime (10:30am) to attend a medical appointment.      Third Therapeutic Activity: Counselor discussed topic of sleep hygiene today with group.  Counselor defined this as the habits, behaviors and environmental factors that can be adjusted to improve overall sleep quality.  Counselor also discussed how lack of consistent sleep can negatively affect mood and overall mental health.  Counselor provided members with a screening to complete assessing typical barriers one might face in achieving quality sleep at night (i.e. struggling to get up in the morning, waking up throughout the night, tossing/turning, etc), and inquired about members' perception of sleep hygiene at present.  Counselor offered techniques to members via a virtual handout which could be implemented in order to improve sleep hygiene, such as sticking to a normal morning/night routine, avoiding using of electronics too close to bedtime, avoiding heavy meals before bed, using a sleep journal to track changes and address anxious thoughts, as well as avoiding naps during the daytime, ensuring proper use of medications if prescribed any by provider(s), and more.  Counselor inquired about changes members intend to make to sleep hygiene based upon information covered  today.  Intervention effectiveness could not be measured, as Shirley Nunez left at breaktime (10:30am) to attend a medical appointment.      Assessment and Plan: Counselor recommends that Shirley Nunez remain in IOP treatment to better manage mental health symptoms, ensure stability and pursue completion of treatment plan goals. Counselor recommends adherence to crisis/safety plan, taking medications as prescribed, and following up with medical professionals if any issues arise.    Follow Up Instructions: Counselor will send Webex link for session tomorrow.  Shirley Nunez was advised to call back or seek an in-person evaluation if the symptoms worsen or if the condition fails to improve as anticipated.   Collaboration of Care:   Medication Management AEB Dr. Eliseo Gum or Hillery Jacks, NP                                          Case Manager AEB Jeri Modena, CNA   Patient/Guardian was advised Release of Information must be obtained prior to any record release in order to collaborate their care with an outside provider. Patient/Guardian was advised if they have not already done so to contact the registration department to sign all necessary forms in order for [place] to release information regarding their care.   Consent: Patient/Guardian gives verbal consent for treatment and assignment of benefits for services provided during this visit. Patient/Guardian expressed understanding and agreed to proceed.  I provided 90 minutes of non-face-to-face time during this encounter.   Noralee Stain, [place], LCAS 10/10/22

## 2022-10-10 NOTE — Progress Notes (Signed)
Shirley Nunez is a 47 y.o. female here for a follow up of a pre-existing problem.  History of Present Illness:   Chief Complaint  Patient presents with   Abdominal Pain    Pt c/o episodes of severe epigastric pain and burning.    Hypertension    Pt says blood pressure has been elevated 130-140/90-97.    HPI  Abdomen pain Patient reports episode of upper epigastric pain, nausea, early satiety x 2-3 days Symptom(s) are consistent with last summer when she was diagnosed with H pylori She is taking pepto bismol for symptom(s) Denies: bloody stools, vomiting  HTN Currently taking hydrochloroTHIAZIDE 25 mg. At home blood pressure readings are: around 130-140/90-97. Patient denies chest pain, SOB, blurred vision, dizziness, unusual headaches, lower leg swelling. Patient is compliant with medication. Denies excessive caffeine intake, stimulant usage, excessive alcohol intake, or increase in salt consumption.  She has had weight gain from Remeron and is planning to stop this soon. Hoping weight loss helps lower blood pressure.   BP Readings from Last 3 Encounters:  10/10/22 (!) 136/98  07/31/22 (!) 160/110  07/07/22 120/80      Past Medical History:  Diagnosis Date   Anxiety    Depression    HSV infection    on daily medication   Migraine    Sickle cell anemia (HCC)    Sickle cell trait     Social History   Tobacco Use   Smoking status: Never   Smokeless tobacco: Never  Vaping Use   Vaping Use: Never used  Substance Use Topics   Alcohol use: Yes    Alcohol/week: 2.0 standard drinks of alcohol    Types: 2 Glasses of wine per week    Comment: 2 x / month   Drug use: No    Past Surgical History:  Procedure Laterality Date   ENDOMETRIAL ABLATION     WISDOM TOOTH EXTRACTION      Family History  Problem Relation Age of Onset   Hypertension Mother    Depression Mother    Miscarriages / Stillbirths Mother    Hypertension Father    Hyperlipidemia Father     Diabetes Father    Arthritis Father    Stroke Father    Sickle cell anemia Sister    Alcohol abuse Maternal Grandmother    Arthritis Maternal Grandmother    Depression Maternal Grandmother    Diabetes Maternal Grandmother    Heart attack Maternal Grandmother    Hypertension Maternal Grandmother    Stroke Maternal Grandmother    Heart attack Maternal Grandfather    Lung cancer Paternal Grandmother        mets to brain   Alcohol abuse Paternal Grandfather    Heart attack Paternal Grandfather    Hypertension Paternal Grandfather    Stroke Paternal Grandfather    Colon cancer Neg Hx     Allergies  Allergen Reactions   Mushroom Extract Complex Rash   Penicillins Rash    Current Medications:   Current Outpatient Medications:    diazepam (VALIUM) 5 MG tablet, Take 1 tablet (5 mg total) by mouth every 12 (twelve) hours as needed for anxiety., Disp: 30 tablet, Rfl: 1   hydrochlorothiazide (HYDRODIURIL) 25 MG tablet, Take 1 tablet (25 mg total) by mouth daily as needed., Disp: 90 tablet, Rfl: 3   mirtazapine (REMERON) 15 MG tablet, Take 1 tablet (15 mg total) by mouth at bedtime., Disp: 30 tablet, Rfl: 0   pantoprazole (PROTONIX) 40 MG tablet,  Take 1 tablet (40 mg total) by mouth daily., Disp: 30 tablet, Rfl: 0   valACYclovir (VALTREX) 500 MG tablet, Take 1 tablet (500 mg total) by mouth daily., Disp: 90 tablet, Rfl: 2   hydrOXYzine (ATARAX) 25 MG tablet, Take 1 tablet (25 mg total) by mouth at bedtime and may repeat dose one time if needed. (Patient not taking: Reported on 10/10/2022), Disp: 30 tablet, Rfl: 0   Review of Systems:   ROS  Vitals:   Vitals:   10/10/22 1210 10/10/22 1237  BP: (!) 142/96 (!) 136/98  Pulse: 92   Temp: (!) 97.3 F (36.3 C)   TempSrc: Temporal   SpO2: 100%   Weight: 155 lb 4 oz (70.4 kg)   Height: 5\' 6"  (1.676 m)      Body mass index is 25.06 kg/m.  Physical Exam:   Physical Exam Constitutional:      General: She is not in acute distress.     Appearance: Normal appearance. She is not ill-appearing.  HENT:     Head: Normocephalic and atraumatic.     Right Ear: External ear normal.     Left Ear: External ear normal.  Eyes:     Extraocular Movements: Extraocular movements intact.     Pupils: Pupils are equal, round, and reactive to light.  Cardiovascular:     Rate and Rhythm: Normal rate and regular rhythm.     Heart sounds: Normal heart sounds. No murmur heard.    No gallop.  Pulmonary:     Effort: Pulmonary effort is normal. No respiratory distress.     Breath sounds: Normal breath sounds. No wheezing or rales.  Abdominal:     General: Abdomen is flat. Bowel sounds are normal.     Palpations: Abdomen is soft.     Tenderness: There is no abdominal tenderness.  Skin:    General: Skin is warm and dry.  Neurological:     Mental Status: She is alert and oriented to person, place, and time.  Psychiatric:        Judgment: Judgment normal.     Assessment and Plan:   Pain of upper abdomen No red flags on exam Recommend protonix 40 mg daily Update H Plyori Recommend avoiding triggering foods Will treat H Pylori if positive No acute abdomen or evidence of need for imaging today  Essential hypertension Above goal No evidence of end organ damage Continue hydrochloroTHIAZIDE 25 mg daily Continue home monitoring She declines any change in treatment(s) today Follow-up in 1-3 month(s), sooner if concerns  I,Verona Buck,acting as a scribe for [employer], PA.,have documented all relevant documentation on the behalf of Jarold Motto, PA,as directed by  Jarold Motto, PA while in the presence of Jarold Motto, [place].  I, Jarold Motto, [place], have reviewed all documentation for this visit. The documentation on 10/10/22 for the exam, diagnosis, procedures, and orders are all accurate and complete.  Jarold Motto, PA-C

## 2022-10-11 ENCOUNTER — Encounter (HOSPITAL_COMMUNITY): Payer: Self-pay | Admitting: Family

## 2022-10-11 DIAGNOSIS — F4310 Post-traumatic stress disorder, unspecified: Secondary | ICD-10-CM | POA: Insufficient documentation

## 2022-10-11 DIAGNOSIS — F410 Panic disorder [episodic paroxysmal anxiety] without agoraphobia: Secondary | ICD-10-CM | POA: Insufficient documentation

## 2022-10-11 DIAGNOSIS — F332 Major depressive disorder, recurrent severe without psychotic features: Secondary | ICD-10-CM | POA: Insufficient documentation

## 2022-10-11 DIAGNOSIS — F411 Generalized anxiety disorder: Secondary | ICD-10-CM | POA: Insufficient documentation

## 2022-10-12 NOTE — Progress Notes (Signed)
Virtual Visit via Video Note  I connected with Shirley Nunez on 09/25/22 at  9:00 AM EDT by a video enabled telemedicine application and verified that I am speaking with the correct person using two identifiers.  Location: Patient: patient home Provider: clinical home office   I discussed the limitations of evaluation and management by telemedicine and the availability of in person appointments. The patient expressed understanding and agreed to proceed.  I discussed the assessment and treatment plan with the patient. The patient was provided an opportunity to ask questions and all were answered. The patient agreed with the plan and demonstrated an understanding of the instructions.   The patient was advised to call back or seek an in-person evaluation if the symptoms worsen or if the condition fails to improve as anticipated.  I provided 180 minutes of non-face-to-face time during this encounter.   Donia Guiles, LCSW   Daily Group Progress Note  Program: IOP  Group Time: 9:00 - 10:30  Participation Level: Active  Behavioral Response: Appropriate and Sharing  Type of Therapy:  Group Therapy  Summary of Progress: Clinician led check-in regarding current stressors and situation, and review of patient completed daily inventory. Clinician utilized active listening and empathetic response and validated patient emotions. Clinician facilitated processing group on pertinent issues.?  Patient rates her mood at a 4.5 on a scale of 1-10 with 10 being best. Pt states she feels "kind of sad." Pt states she slept 4 hours and ate 3x. Pt reports it was her son's 18th birthday yesterday which caused some sadness and grief. Patient able to process. Patient engaged in discussion.     Progress Towards Goals: Progressing   Group Time: 10:30 - 12:00  Participation Level:  Active  Behavioral Response: Appropriate and Sharing  Type of Therapy: Group Therapy  Summary of Progress: Cln led  discussion on protective factors and how they are things within our control that we can work on to improve our health and mental fortitude. Group discussed where they feel like they are with certain protective factors and how they can take a baby step to increase where they are with protective factors.  Pt engaged in discussion and is able to determine action steps to increase protective factors.  Progress Towards Goals: Progressing   Donia Guiles, LCSW

## 2022-10-12 NOTE — Progress Notes (Signed)
Virtual Visit via Video Note  I connected with Shirley Nunez on 09/26/22 at  9:00 AM EDT by a video enabled telemedicine application and verified that I am speaking with the correct person using two identifiers.  Location: Patient: patient home Provider: clinical home office   I discussed the limitations of evaluation and management by telemedicine and the availability of in person appointments. The patient expressed understanding and agreed to proceed.  I discussed the assessment and treatment plan with the patient. The patient was provided an opportunity to ask questions and all were answered. The patient agreed with the plan and demonstrated an understanding of the instructions.   The patient was advised to call back or seek an in-person evaluation if the symptoms worsen or if the condition fails to improve as anticipated.  I provided 180 minutes of non-face-to-face time during this encounter.   Donia Guiles, LCSW   Daily Group Progress Note  Program: IOP  Group Time: 9:00 - 10:30  Participation Level: Active  Behavioral Response: Appropriate and Sharing  Type of Therapy:  Group Therapy  Summary of Progress: Clinician led check-in regarding current stressors and situation, and review of patient completed daily inventory. Clinician utilized active listening and empathetic response and validated patient emotions. Clinician facilitated processing group on pertinent issues.?  Patient rates her mood at a 3 on a scale of 1-10 with 10 being best. Pt states she feels "tired." Pt states she slept 3 hours and ate 1x. Pt reports she has been ill to her stomach since yesterday afternoon and it has impacted her sleep, energy, and mood. Patient able to process. Patient engaged in discussion.     Progress Towards Goals: Progressing   Group Time: 10:30 - 12:00  Participation Level:  Active  Behavioral Response: Appropriate and Sharing  Type of Therapy: Group Therapy  Summary of  Progress: Cln led discussion on support groups and the role they can provide in recovery and ongoing treatment. Group discussed success and barriers they have had with past support groups or they have with considering support groups. Cln provided resources for local support groups.  Pt engaged in discussion and reports interest in engaging in support groups.     Donia Guiles, LCSW

## 2022-10-13 ENCOUNTER — Other Ambulatory Visit (HOSPITAL_COMMUNITY): Payer: BC Managed Care – PPO | Attending: Psychiatry | Admitting: Licensed Clinical Social Worker

## 2022-10-13 ENCOUNTER — Ambulatory Visit: Payer: BC Managed Care – PPO | Admitting: Physician Assistant

## 2022-10-13 DIAGNOSIS — F332 Major depressive disorder, recurrent severe without psychotic features: Secondary | ICD-10-CM

## 2022-10-13 DIAGNOSIS — F410 Panic disorder [episodic paroxysmal anxiety] without agoraphobia: Secondary | ICD-10-CM | POA: Diagnosis not present

## 2022-10-13 DIAGNOSIS — F411 Generalized anxiety disorder: Secondary | ICD-10-CM | POA: Diagnosis not present

## 2022-10-13 DIAGNOSIS — F4310 Post-traumatic stress disorder, unspecified: Secondary | ICD-10-CM

## 2022-10-13 NOTE — Progress Notes (Signed)
Virtual Visit via Video Note  I connected with Shirley Nunez on 10/13/22 at  9:00 AM EDT by a video enabled telemedicine application and verified that I am speaking with the correct person using two identifiers.  Location: Patient: Home Provider: Office   I discussed the limitations of evaluation and management by telemedicine and the availability of in person appointments. The patient expressed understanding and agreed to proceed.     I discussed the assessment and treatment plan with the patient. The patient was provided an opportunity to ask questions and all were answered. The patient agreed with the plan and demonstrated an understanding of the instructions.   The patient was advised to call back or seek an in-person evaluation if the symptoms worsen or if the condition fails to improve as anticipated.    [name], MD  [facility] MD/PA/NP OP Progress Note  10/13/2022 12:46 PM Shirley Nunez  MRN:  [id number]  Chief Complaint:  Chief Complaint  Patient presents with   Depression   Anxiety   HPI:  Shirley Nunez is a 47 yo patient with a PPH of PTSD, GAD, and MDD.   Patient reports that she is not doing well, she has not taken any medication in a few days because she was confused about her medications. Patient reports that she felt very overwhelmed by the adjustments. Patient reports that she felt like she was gaining weight on Remeron. Patient reports that she did try the hydrxozyine but did not sleep well on them. Patient reports that her BP has also been going up and she now has to take her HCTZ daily. Patient reports that she is upset that the Remeron was helping but her weight has increased.   Patient reports that as a result of all the feelings and feeling overwhelmed last week, she has not really eaten or slept the last 2 days due to being a state of panic. Patient endorses passive SI, but reports her plan is to take her meds and eat today. Patient reports that she did drink Etoh  this weekend, to help her make it through the weekend with family at her son's graduation. Patient reports that she did have a really good conversation with her cousin who helped her emotionally. She also endorses that she thinks if she has been taking her medications she probably wouldn't have ben drinking.  Patient denies HI and AVH.  Patient is very fearful of gong back to work, but reports she will take this to group to help her come up with ways to feel better about her pending discharge on Thursday from IOP.   Visit Diagnosis:    ICD-10-CM   1. PTSD (post-traumatic stress disorder)  F43.10     2. Severe episode of recurrent major depressive disorder, without psychotic features (HCC)  F33.2       Past Psychiatric History: INPT: 10/24/1999 after son died, SA of Xanax went to [facility] OPT: Current Dr. Bronwen Betters 1x, not in the past Therapy: Melanie Paschal Hx meds: Celexa 10mg  (apathy), Klonopin 1mg  daily, Prazosin (makes her talk really slow others noticed, did not sleep any better), Lexapro (extreme diaphoresis and headaches, really tried to be compliant) Valium, Xanax, Zoloft ( too anxious and not able to titrate past 50mg )  Past Medical History:  Past Medical History:  Diagnosis Date   Anxiety    Depression    HSV infection    on daily medication   Migraine    Sickle cell anemia (HCC)  Sickle cell trait    Past Surgical History:  Procedure Laterality Date   ENDOMETRIAL ABLATION     WISDOM TOOTH EXTRACTION      Family Psychiatric History: Father: Opioid use d/o A lot of etoh use d/o P Aunt: Bipolar d/o Other P Aunt: dementia first signs were in her 81s  Family History:  Family History  Problem Relation Age of Onset   Hypertension Mother    Depression Mother    Miscarriages / Stillbirths Mother    Hypertension Father    Hyperlipidemia Father    Diabetes Father    Arthritis Father    Stroke Father    Sickle cell anemia Sister    Alcohol abuse Maternal Grandmother     Arthritis Maternal Grandmother    Depression Maternal Grandmother    Diabetes Maternal Grandmother    Heart attack Maternal Grandmother    Hypertension Maternal Grandmother    Stroke Maternal Grandmother    Heart attack Maternal Grandfather    Lung cancer Paternal Grandmother        mets to brain   Alcohol abuse Paternal Grandfather    Heart attack Paternal Grandfather    Hypertension Paternal Grandfather    Stroke Paternal Grandfather    Colon cancer Neg Hx     Social History:  Social History   Socioeconomic History   Marital status: Single    Spouse name: Not on file   Number of children: 2   Years of education: Not on file   Highest education level: Associate degree: occupational, Scientist, product/process development, or vocational program  Occupational History   Not on file  Tobacco Use   Smoking status: Never   Smokeless tobacco: Never  Vaping Use   Vaping Use: Never used  Substance and Sexual Activity   Alcohol use: Yes    Alcohol/week: 2.0 standard drinks of alcohol    Types: 2 Glasses of wine per week    Comment: 2 x / month   Drug use: No   Sexual activity: Yes    Birth control/protection: Other-see comments, None    Comment: ablation  Other Topics Concern   Not on file  Social History Narrative   Sales promotion account executive at [employer]    Married   2 kids   Likes: writing and reading   One of her sons passed away 16 years ago   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, 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   Social Determinants of Health   Financial Resource Strain: Medium Risk (07/31/2022)   Overall Financial Resource Strain (CARDIA)    Difficulty of Paying Living Expenses: Somewhat hard  Food Insecurity: Food Insecurity Present (07/31/2022)   Hunger Vital Sign    Worried About Running Out of Food in the Last Year: Often true    Ran Out of Food in the Last Year: Sometimes true  Transportation Needs: No Transportation Needs (07/31/2022)   PRAPARE - Administrator, Civil Service  (Medical): No    Lack of Transportation (Non-Medical): No  Physical Activity: Insufficiently Active (07/31/2022)   Exercise Vital Sign    Days of Exercise per Week: 3 days    Minutes of Exercise per Session: 30 min  Stress: Stress Concern Present (07/31/2022)   [employer] of Occupational Health - Occupational Stress Questionnaire    Feeling of Stress : Very much  Social Connections: Socially Isolated (07/31/2022)   Social Connection and Isolation Panel [NHANES]    Frequency of Communication with Friends and Family: Once  a week    Frequency of Social Gatherings with Friends and Family: Never    Attends Religious Services: 1 to 4 times per year    Active Member of [employer] or Organizations: No    Attends Engineer, structural: Not on file    Marital Status: Divorced    Allergies:  Allergies  Allergen Reactions   Mushroom Extract Complex Rash   Penicillins Rash    Metabolic Disorder Labs: No results found for: "HGBA1C", "MPG" No results found for: "PROLACTIN" Lab Results  Component Value Date   CHOL 196 07/09/2021   TRIG 40.0 07/09/2021   HDL 83.00 07/09/2021   CHOLHDL 2 07/09/2021   VLDL 8.0 07/09/2021   LDLCALC 105 (H) 07/09/2021   LDLCALC 94 09/14/2019   Lab Results  Component Value Date   TSH 0.84 09/27/2020   TSH 1.00 12/17/2017    Therapeutic Level Labs: No results found for: "LITHIUM" No results found for: "VALPROATE" No results found for: "CBMZ"  Current Medications: Current Outpatient Medications  Medication Sig Dispense Refill   diazepam (VALIUM) 5 MG tablet Take 1 tablet (5 mg total) by mouth every 12 (twelve) hours as needed for anxiety. 30 tablet 1   hydrochlorothiazide (HYDRODIURIL) 25 MG tablet Take 1 tablet (25 mg total) by mouth daily as needed. 90 tablet 3   hydrOXYzine (ATARAX) 25 MG tablet Take 1 tablet (25 mg total) by mouth at bedtime and may repeat dose one time if needed. (Patient not taking: Reported on 10/10/2022) 30 tablet 0    mirtazapine (REMERON) 15 MG tablet Take 1 tablet (15 mg total) by mouth at bedtime. 30 tablet 0   pantoprazole (PROTONIX) 40 MG tablet Take 1 tablet (40 mg total) by mouth daily. 30 tablet 0   valACYclovir (VALTREX) 500 MG tablet Take 1 tablet (500 mg total) by mouth daily. 90 tablet 2   No current facility-administered medications for this visit.    Psychiatric Specialty Exam: Review of Systems  Psychiatric/Behavioral:  Positive for dysphoric mood, sleep disturbance and suicidal ideas. Negative for hallucinations. The patient is nervous/anxious.     There were no vitals taken for this visit.There is no height or weight on file to calculate BMI.  General Appearance: Casual  Eye Contact:  Good  Speech:  Clear and Coherent  Volume:  Normal  Mood:  Anxious  Affect:  Congruent and Tearful  Thought Process:  Goal Directed  Orientation:  Full (Time, Place, and Person)  Thought Content: Logical   Suicidal Thoughts:  Yes.  without intent/plan  Homicidal Thoughts:  No  Memory:  Immediate;   Good Recent;   Good  Judgement:  Other:  improving  Insight:  Shallow  Psychomotor Activity:  Normal  Concentration:  Concentration: Fair  Recall:  NA  Fund of Knowledge: Good  Language: Good  Akathisia:  No  Handed:    AIMS (if indicated): not done  Assets:  Communication Skills Desire for Improvement Housing Social Support  ADL's:  Intact  Cognition: WNL  Sleep:  Poor   Screenings: GAD-7    Flowsheet Row Office Visit from 07/31/2022 in [facility] from 06/11/2022 in [facility] from 11/19/2021 in [facility] from 07/09/2021 in [facility] from 03/27/2021 in [facility]  Total GAD-7 Score 20 18 11 17 5       PHQ2-9    Flowsheet Row Counselor from 08/29/2022 in BEHAVIORAL HEALTH  PARTIAL HOSPITALIZATION PROGRAM Counselor  from 08/21/2022 in BEHAVIORAL HEALTH PARTIAL HOSPITALIZATION PROGRAM Office Visit from 07/31/2022 in [facility] Visit from 07/07/2022 in [facility] from 06/11/2022 in [facility]  PHQ-2 Total Score 6 5 6 5 5   PHQ-9 Total Score 24 21 22 21 21       Flowsheet Row Counselor from 08/29/2022 in BEHAVIORAL HEALTH PARTIAL HOSPITALIZATION PROGRAM Counselor from 08/21/2022 in BEHAVIORAL HEALTH PARTIAL HOSPITALIZATION PROGRAM ED from 11/14/2021 in [facility] Emergency Department [facility]  C-SSRS RISK CATEGORY No Risk Low Risk No Risk        Assessment and Plan: Patient not doing well today, and appears extremely decompensated.  Although patient was concerned about weight gain, looking at patient's updated BMI patient appears to be at a fairly appropriate weight given her height.  Discussed with patient who also agreed that it is fairly likely she was under eating due to her anxiety and depression over the last year.  Patient appeared to be very overwhelmed at the thought of having to take care of her weight, her blood pressure worsening and her anxiety.  Patient endorsed feeling that other than increasing her appetite the medication was really good for her mental health.  It is possible that while patient's appetite has increased it may be an appropriate appetite but in respect to how much she was eating before it seems a significant increase.  Recommend continuing to monitor this however, given patient's severe level of decompensation without the medication would recommend restarting the medication at 15 mg as patient has not been sleeping well and was extremely anxious and tearful on assessment today.  Patient was also engaging in self-medication using EtOH over the weekend without medication on board.  When weighing the benefits and the risk is better for patient to be on Remeron than to self-medicate with EtOH  or possibly other substances in the future.  Patient may also need to consider an SNRI in the future however these have also been known to increase blood pressure, as patient has not done well with SSRIs due to other adverse side effects.  Remeron does appear to be her best option at this time.  Patient is very fearful about her pending discharge and endorsed feeling dependent and a need for others, but also reluctant to be in group situations, but was receptive to advised to take these concerns to group and work on this throughout the week.  PTSD Panic d/o MDD, recurrent, severe GAD Restart remeron 15mg  QHS Dc Hydroxyzine-does not help with patient's sleep  Collaboration of Care: Collaboration of Care:   Patient/Guardian was advised Release of Information must be obtained prior to any record release in order to collaborate their care with an outside provider. Patient/Guardian was advised if they have not already done so to contact the registration department to sign all necessary forms in order for [place] to release information regarding their care.   Consent: Patient/Guardian gives verbal consent for treatment and assignment of benefits for services provided during this visit. Patient/Guardian expressed understanding and agreed to proceed.   PGY-3 [name], MD 10/13/2022, 12:46 PM

## 2022-10-13 NOTE — Progress Notes (Signed)
Virtual Visit via Video Note   I connected with Rhylan Z. Schmall on 10/13/22 at  9:00 AM EDT by a video enabled telemedicine application and verified that I am speaking with the correct person using two identifiers.   At orientation to the IOP program, Case Manager discussed the limitations of evaluation and management by telemedicine and the availability of in person appointments. The patient expressed understanding and agreed to proceed with virtual visits throughout the duration of the program.   Location:  Patient: Patient Home Provider: OPT BH Office   History of Present Illness: MDD and PTSD    Observations/Objective: Check In: Case Manager checked in with all participants to review discharge dates, insurance authorizations, work-related documents and needs from the treatment team regarding medications. Shawnika stated needs and engaged in discussion.    Initial Therapeutic Activity: Counselor facilitated a check-in with Dhamar to assess for safety, sobriety and medication compliance.  Counselor also inquired about Menna's current emotional ratings, as well as any significant changes in thoughts, feelings or behavior since previous check in.  Walda presented for session on time and was alert, oriented x5, with no evidence or self-report of active HI or A/V H.  Serentiy denied abuse of illicit substances.  She reported that she did drink 4-5 glasses of wine, a few shots of tequila, and a [place] ice tea over the span of the weekend, which she reported did lead to 2 arguments.  Counselor encouraged Aleatha to be mindful of alcohol use, and avoid relying on this as a means to cope with stress.  Mayli reported scores of 10/10 for depression, 10/10 for anxiety, and 9/10 for anger/irritability.  Kiyoko denied any recent outbursts or panic attacks.  Giuseppina reported that a recent struggle was having a difficult weekend since she was apprehensive about taking a new medication, and did not sleep well as a result.   Azlynn stated "I've been panicking all weekend because I'm not sure what I need to be taking".  Khyli was provided time to meet with Dr. Morrie Sheldon today to discuss medication concerns.  She reported that earlier this morning after dropping her daughter off around 8am, she experienced passive SI without intent, stating "I was driving over a bridge and thought I could drive over the side, but I didn't'".  She reported that she has been having passive SI without intent at least once a day, but worried about opening up to the group for fear of judgement.  Lenzie was receptive to feedback from counselor and group members on importance of being honest with positive supports when struggling with these thoughts.  Shaquilla reported that she could contract for safety today, agreeing to check into the behavioral health hospital if needed should SI return with intent or plan to harm herself.  Lorijean reported that her goal today is to get something to eat, and then rest, since she didn't feel like she could focus for the second half of group.    Second Therapeutic Activity: Counselor introduced topic of self-care today.  Counselor explained how this can be defined as the things one does to maintain good health and improve well-being.  Counselor provided members with a self-care assessment form to complete.  This handout featured various sub-categories of self-care, including physical, psychological/emotional, social, spiritual, and professional.  Members were asked to rank their engagement in the activities listed for each dimension on a scale of 1-3, with 1 indicating 'Poor', 2 indicating 'Ok', and 3 indicating 'Well'.  Counselor invited  members to share results of their assessment, and inquired about which areas of self-care they are doing well in, as well as areas that require attention, and how they plan to begin addressing this during treatment.  Intervention effectiveness could not be measured, as Ariahna requested to leave group  at 10:30am in order to rest due to lack of sleep.  Counselor informed care team of early departure.    Assessment and Plan: Counselor recommends that Teletha remain in IOP treatment to better manage mental health symptoms, ensure stability and pursue completion of treatment plan goals. Counselor recommends adherence to crisis/safety plan, taking medications as prescribed, and following up with medical professionals if any issues arise.    Follow Up Instructions: Counselor will send Webex link for session tomorrow.  Skarleth was advised to call back or seek an in-person evaluation if the symptoms worsen or if the condition fails to improve as anticipated.   Collaboration of Care:   Medication Management AEB Dr. Eliseo Gum or Hillery Jacks, NP                                          Case Manager AEB Jeri Modena, CNA   Patient/Guardian was advised Release of Information must be obtained prior to any record release in order to collaborate their care with an outside provider. Patient/Guardian was advised if they have not already done so to contact the registration department to sign all necessary forms in order for [place] to release information regarding their care.   Consent: Patient/Guardian gives verbal consent for treatment and assignment of benefits for services provided during this visit. Patient/Guardian expressed understanding and agreed to proceed.  I provided 90 minutes of non-face-to-face time during this encounter.   Noralee Stain, LCSW, LCAS 10/13/22

## 2022-10-14 ENCOUNTER — Other Ambulatory Visit (HOSPITAL_COMMUNITY): Payer: BC Managed Care – PPO | Admitting: Licensed Clinical Social Worker

## 2022-10-14 DIAGNOSIS — F411 Generalized anxiety disorder: Secondary | ICD-10-CM | POA: Diagnosis not present

## 2022-10-14 DIAGNOSIS — F410 Panic disorder [episodic paroxysmal anxiety] without agoraphobia: Secondary | ICD-10-CM | POA: Diagnosis not present

## 2022-10-14 DIAGNOSIS — F4310 Post-traumatic stress disorder, unspecified: Secondary | ICD-10-CM | POA: Diagnosis not present

## 2022-10-14 DIAGNOSIS — F332 Major depressive disorder, recurrent severe without psychotic features: Secondary | ICD-10-CM | POA: Diagnosis not present

## 2022-10-14 NOTE — Progress Notes (Signed)
Virtual Visit via Video Note   I connected with Elandra Z. Mceachin on 10/14/22 at  9:00 AM EDT by a video enabled telemedicine application and verified that I am speaking with the correct person using two identifiers.   At orientation to the IOP program, Case Manager discussed the limitations of evaluation and management by telemedicine and the availability of in person appointments. The patient expressed understanding and agreed to proceed with virtual visits throughout the duration of the program.   Location:  Patient: Patient Home Provider: [facility]   History of Present Illness: MDD and PTSD    Observations/Objective: Check In: Case Manager checked in with all participants to review discharge dates, insurance authorizations, work-related documents and needs from the treatment team regarding medications. Lakeysa stated needs and engaged in discussion.    Initial Therapeutic Activity: Counselor facilitated a check-in with Sharisa to assess for safety, sobriety and medication compliance.  Counselor also inquired about Ashleyanne's current emotional ratings, as well as any significant changes in thoughts, feelings or behavior since previous check in.  Shelbie presented for session on time and was alert, oriented x5, with no evidence or self-report of active SI/HI or A/V H.  Rina reported compliance with medication and denied use of alcohol or illicit substances.  Sierre reported scores of 7/10 for depression, 7/10 for anxiety, and 0/10 for anger/irritability.  Caronda denied any recent outbursts or panic attacks.  Rheann reported that a recent success was taking time to sleep after she left group yesterday, stating "I felt a little better after that.  I needed that".  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  Marai reported that a recent struggle was having trouble with nausea yesterday, although she wondered if this had to do with her medication.  Lanetra reported that her goal today  is to pick up her daughter from camp, and then grab a smoothie as a treat.       Second Therapeutic Activity: Counselor introduced [employer], [place] Chaplain to provide psychoeducation on topic of Grief and Loss with members today.  [name] began discussion by checking in with the group about their baseline mood today, general thoughts on what grief means to them and how it has affected them personally in the past.  [name] provided information on how the process of grief/loss can differ depending upon one's unique culture, and categories of loss one could experience (i.e. loss of a person, animal, relationship, job, identity, etc).  [name] encouraged members to be mindful of how pervasive loss can be, and how to recognize signs which could indicate that this is having an impact on one's overall mental health and wellbeing.  Intervention effectiveness was mixed, as Afsheen did not participate in discussion, but was observed engaging in a mindfulness activity with chaplain that could be helpful for processing difficult feelings such as grief.    Third Therapeutic Activity: Counselor introduced topic of grounding skills today.  Counselor defined these as simple strategies one can use to help detach from difficult thoughts or feelings temporarily by focusing on something else.  Counselor noted that grounding will not solve the problem at hand, but can provide the practitioner with time to regain control over their thoughts and/or feelings and prevent the situation from getting worse (i.e. interrupting a panic attack).  Counselor divided these into three categories (mental, physical, and soothing) and then provided examples of each which group members could practice during session.  Some  of these included describing one's environment in detail or playing a categories game with oneself for mental category, taking a hot bath/shower, stretching, or carrying a grounding object for physical category, and saying kind  statements, or visualizing people one cares about for soothing category.  Counselor inquired about which techniques members have used with success in the past, or will commit to learning, practicing, and applying now to improve coping abilities.  Intervention was effective, as evidenced by [name] participating in discussion on the subject, trying out several of the techniques during session, and expressing interest in adding several to her available coping skills, such as describing her environment in detail, squeezing a soft pillow as a grounding object, playing a categories game involving listing favorite writers, imagining herself relaxing at the beach, reading an engrossing nonfiction book, dancing with her children, running cool water on her skin, fidgeting with objects around her like her hair or string, using the affirmation "You are enough",  or looking at family pictures.    Assessment and Plan: Counselor recommends that Brandyce remain in IOP treatment to better manage mental health symptoms, ensure stability and pursue completion of treatment plan goals. Counselor recommends adherence to crisis/safety plan, taking medications as prescribed, and following up with medical professionals if any issues arise.    Follow Up Instructions: Counselor will send Webex link for session tomorrow.  Neco was advised to call back or seek an in-person evaluation if the symptoms worsen or if the condition fails to improve as anticipated.   Collaboration of Care:   Medication Management AEB Dr. Eliseo Gum or Hillery Jacks, NP                                          Case Manager AEB Jeri Modena, CNA   Patient/Guardian was advised Release of Information must be obtained prior to any record release in order to collaborate their care with an outside provider. Patient/Guardian was advised if they have not already done so to contact the registration department to sign all necessary forms in order for [place] to release  information regarding their care.   Consent: Patient/Guardian gives verbal consent for treatment and assignment of benefits for services provided during this visit. Patient/Guardian expressed understanding and agreed to proceed.  I provided 180 minutes of non-face-to-face time during this encounter.   Noralee Stain, LCSW, LCAS 10/14/22

## 2022-10-15 ENCOUNTER — Telehealth (HOSPITAL_COMMUNITY): Payer: Self-pay | Admitting: Psychiatry

## 2022-10-15 ENCOUNTER — Other Ambulatory Visit (HOSPITAL_COMMUNITY): Payer: BC Managed Care – PPO | Admitting: Psychiatry

## 2022-10-15 DIAGNOSIS — F332 Major depressive disorder, recurrent severe without psychotic features: Secondary | ICD-10-CM

## 2022-10-15 DIAGNOSIS — M9905 Segmental and somatic dysfunction of pelvic region: Secondary | ICD-10-CM | POA: Diagnosis not present

## 2022-10-15 DIAGNOSIS — F4310 Post-traumatic stress disorder, unspecified: Secondary | ICD-10-CM | POA: Diagnosis not present

## 2022-10-15 DIAGNOSIS — F410 Panic disorder [episodic paroxysmal anxiety] without agoraphobia: Secondary | ICD-10-CM | POA: Diagnosis not present

## 2022-10-15 DIAGNOSIS — M9902 Segmental and somatic dysfunction of thoracic region: Secondary | ICD-10-CM | POA: Diagnosis not present

## 2022-10-15 DIAGNOSIS — M9904 Segmental and somatic dysfunction of sacral region: Secondary | ICD-10-CM | POA: Diagnosis not present

## 2022-10-15 DIAGNOSIS — M9903 Segmental and somatic dysfunction of lumbar region: Secondary | ICD-10-CM | POA: Diagnosis not present

## 2022-10-15 DIAGNOSIS — F411 Generalized anxiety disorder: Secondary | ICD-10-CM | POA: Diagnosis not present

## 2022-10-15 LAB — H. PYLORI BREATH TEST: H. pylori Breath Test: NOT DETECTED

## 2022-10-15 NOTE — Progress Notes (Signed)
Virtual Visit via Video Note   I connected with Shirley Nunez on 10/15/22 at  9:00 AM EDT by a video enabled telemedicine application and verified that I am speaking with the correct person using two identifiers.   At orientation to the IOP program, Case Manager discussed the limitations of evaluation and management by telemedicine and the availability of in person appointments. The patient expressed understanding and agreed to proceed with virtual visits throughout the duration of the program.   Location:  Patient: Patient Home Provider: [facility]   History of Present Illness: MDD and PTSD    Observations/Objective: Check In: Case Manager checked in with all participants to review discharge dates, insurance authorizations, work-related documents and needs from the treatment team regarding medications. Shirley Nunez stated needs and engaged in discussion.    Initial Therapeutic Activity: Counselor facilitated a check-in with Shirley Nunez to assess for safety, sobriety and medication compliance.  Counselor also inquired about Shirley Nunez's current emotional ratings, as well as any significant changes in thoughts, feelings or behavior since previous check in.  Shirley Nunez presented for session on time and was alert, oriented x5, with no evidence or self-report of active SI/HI or A/V H.  Shirley Nunez reported compliance with medication and denied use of alcohol or illicit substances.  Shirley Nunez reported scores of 4/10 for depression, 8/10 for anxiety, and 0/10 for anger/irritability.  Shirley Nunez denied any recent outbursts or panic attacks.  Shirley Nunez reported that a recent success was spending quality time with her daughter yesterday, stating "We got a smoothie and went to [facility]".  Shirley Nunez reported that a recent struggle was getting a fraud alert on her credit card, which had her worried.  Shirley Nunez reported that her goal today is to attend a chiropractic appointment.       Second Therapeutic Activity: Counselor introduced Shirley Nunez, [employer]  Pharmacist, to provide psychoeducation on topic of medication compliance with members today.  Shirley Nunez provided psychoeducation on classes of medications such as antidepressants, antipsychotics, what symptoms they are intended to treat, and any side effects one might encounter while on a particular prescription.  Time was allowed for clients to ask any questions they might have of [facility] regarding this specialty.  Intervention was effective, as evidenced by Shirley Nunez participating in discussion with speaker on the subject, and inquiring about why Benadryl can sometimes be stimulating.  Shirley Nunez was receptive to feedback from pharmacist on the benefits and potential side effects of this OTC medication.      Third Therapeutic Activity: Counselor introduced topic of creating mental health maintenance plan today.  Counselor provided handout on subject to members, which stressed the importance of maintaining one's mental health in a similar way to using diet and exercise to ensure physical health.  Counselor walked members through process of identifying triggers which could worsen symptoms, including specific people, places, and things one needs to avoid.  Members were also tasked with identifying warning signs such as thoughts, feelings, or behaviors which could indicate mental health is at increased risk.  Counselor also facilitated conversation on self-care activities and coping strategies which members have previously utilized in the past, are currently using in daily routine, or plan to use soon to assist with managing problems or symptoms when/if they appear.  Counselor encouraged members to revisit their maintenance plan often and make changes as needed to ensure day to day stability.  Intervention was effective, as evidenced by Shirley Nunez participating in activity and creating a comprehensive plan, including identification of triggers such as loud voices  or noises, disrespect or condescension from a man, and warning signs  including isolation from others when she feels like she might have an emotional outburst.  Shirley Nunez also reported that she would make an effort to set aside more time for self-care activities and practice coping skills to avoid falling back into old patterns, such as overworking at her job as a form of escapism.     Assessment and Plan: Counselor recommends that Shirley Nunez remain in IOP treatment to better manage mental health symptoms, ensure stability and pursue completion of treatment plan goals. Counselor recommends adherence to crisis/safety plan, taking medications as prescribed, and following up with medical professionals if any issues arise.    Follow Up Instructions: Counselor will send Webex link for session tomorrow.  Shirley Nunez was advised to call back or seek an in-person evaluation if the symptoms worsen or if the condition fails to improve as anticipated.   Collaboration of Care:   Medication Management AEB Dr. Eliseo Gum or Hillery Jacks, NP                                          Case Manager AEB Jeri Modena, CNA   Patient/Guardian was advised Release of Information must be obtained prior to any record release in order to collaborate their care with an outside provider. Patient/Guardian was advised if they have not already done so to contact the registration department to sign all necessary forms in order for [place] to release information regarding their care.   Consent: Patient/Guardian gives verbal consent for treatment and assignment of benefits for services provided during this visit. Patient/Guardian expressed understanding and agreed to proceed.  I provided 180 minutes of non-face-to-face time during this encounter.   Noralee Stain, LCSW, LCAS 10/15/22

## 2022-10-15 NOTE — Telephone Encounter (Signed)
D:  MH-IOP Case Manager placed call to [employer] ([phone]) spoke to Odessa Fleming (rep) re: approval.  According to Odessa Fleming, the company approved pt to be out of work through 10-26-22; and returning back to work on 10-27-22.  A:  Inform pt and treatment team.

## 2022-10-16 ENCOUNTER — Other Ambulatory Visit (HOSPITAL_COMMUNITY): Payer: BC Managed Care – PPO | Admitting: Psychiatry

## 2022-10-16 DIAGNOSIS — F4310 Post-traumatic stress disorder, unspecified: Secondary | ICD-10-CM

## 2022-10-16 DIAGNOSIS — F332 Major depressive disorder, recurrent severe without psychotic features: Secondary | ICD-10-CM

## 2022-10-16 DIAGNOSIS — F410 Panic disorder [episodic paroxysmal anxiety] without agoraphobia: Secondary | ICD-10-CM | POA: Diagnosis not present

## 2022-10-16 DIAGNOSIS — F411 Generalized anxiety disorder: Secondary | ICD-10-CM | POA: Diagnosis not present

## 2022-10-16 MED ORDER — MIRTAZAPINE 15 MG PO TABS: 15.0000 mg | ORAL_TABLET | Freq: Every day | ORAL | 0 refills | Status: DC
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
## 2022-10-16 NOTE — Progress Notes (Signed)
Virtual Visit via Video Note  I connected with Shirley Nunez on 10/16/22 at  9:00 AM EDT by a video enabled telemedicine application and verified that I am speaking with the correct person using two identifiers.  Location: Patient: Home Provider: Office   I discussed the limitations of evaluation and management by telemedicine and the availability of in person appointments. The patient expressed understanding and agreed to proceed.    I discussed the assessment and treatment plan with the patient. The patient was provided an opportunity to ask questions and all were answered. The patient agreed with the plan and demonstrated an understanding of the instructions.   The patient was advised to call back or seek an in-person evaluation if the symptoms worsen or if the condition fails to improve as anticipated.    Shirley Morton, MD  [facility]ensive [facility]patient Program Discharge Summary  Shirley Nunez [id number]  Admission date: 09/25/2022 Discharge date: 10/16/2022  Reason for admission: PTSD, Anxiety, and depression  Chemical Use History: Caffeine: 2 cups of coffee/ day Cigs: no Vape: no THC: gummies very rare No other substances    Family of Origin Issues: Father: Opioid use d/o A lot of etoh use d/o P Aunt: Bipolar d/o Other P Aunt: dementia first signs were in her 98s  Progress in Program Toward Treatment Goals: Progressing Patient did not do well on Zoloft, anxiety continued to worsen during treatment. Noted to find benefit from Remeron.   Progress (rationale):  Shirley Nunez is a 47 yo patient with a PPH of PTSD, GAD, and MDD.  Patient reports that she is doing "ok." Patient is sleeping better on Remeron at 15mg  QHS. Patient endorses reasonably anxiety about completing IOP. Patient has really worked on being honest with her emotions and vocalizing her emotions. Patient still has hypervigilance related to her trauma. Patient denies active SI, patient reports  that she has passive SI but denies a plan and endorses that she really felt like if she has SI she wont have to leave group. Patient denies HI and AVH.   Patient will be going on a beach trip soon. Patient will try to do something to commerate her ending her time in counseling.  Patient has also already taken it upon herself to start practicing driving to work to decrease anxiety on the day that she must actually return.  Patient has been engaged in therapy and continues to use coping skills she has learned. Patient reports that she really feels she benefited from learning how to verbalizing her true feelings and have less negative preconceived ideas about mental health. Patient also felt like she got more benefit from learning where her current behaviors may have come from and what she needs to work on moving forward.  Psychiatric Specialty Exam:   Review of Systems  Psychiatric/Behavioral:  Negative for hallucinations, sleep disturbance and suicidal ideas. The patient is nervous/anxious.     There were no vitals taken for this visit.There is no height or weight on file to calculate BMI.  General Appearance: Casual  Eye Contact:  Good  Speech:  Clear and Coherent  Volume:  Normal  Mood:  Anxious  Affect:  Appropriate  Thought Process:  Coherent  Orientation:  Full (Time, Place, and Person)  Thought Content:  Logical  Suicidal Thoughts:  No  Homicidal Thoughts:  No  Memory:  Immediate;   Good Recent;   Good  Judgement:  Fair  Insight:  Good  Psychomotor Activity:  Normal  Concentration:  Concentration: Good  Recall:  NA  Fund of Knowledge:  Good  Language:  Good  Akathisia:  NA  Handed:    AIMS (if indicated):     Assets:  Communication Skills Desire for Improvement Housing Leisure Time Resilience Social Support Transportation  ADL's:  Intact  Cognition:  WNL  Sleep:    Good     Collaboration of Care: IOP team Patient will f/u with Dr. Hazle Nunez and her  therapist  PTSD Panic d/o MDD, recurrent, severe GAD - Continue Remeron to 15 mg nightly  Patient/Guardian was advised Release of Information must be obtained prior to any record release in order to collaborate their care with an outside provider. Patient/Guardian was advised if they have not already done so to contact the registration department to sign all necessary forms in order for [place] to release information regarding their care.   Consent: Patient/Guardian gives verbal consent for treatment and assignment of benefits for services provided during this visit. Patient/Guardian expressed understanding and agreed to proceed.   [id number]  Shirley Gum, MD 10/16/2022
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
## 2022-10-16 NOTE — Patient Instructions (Signed)
D:  Patient completed virtual MH-IOP today.  A:  Discharged today.  Follow up with Dr. Park Pope on 11-19-22 @ 1pm (in person) and patient states she will contact her therapist Luna Glasgow for an appt.  Strongly recommend [facility] [phone].  Return to work on 10-27-22 without any restrictions.  R:  Patient receptive.

## 2022-10-17 ENCOUNTER — Other Ambulatory Visit (HOSPITAL_COMMUNITY): Payer: BC Managed Care – PPO

## 2022-10-20 ENCOUNTER — Other Ambulatory Visit (HOSPITAL_COMMUNITY): Payer: BC Managed Care – PPO

## 2022-10-21 ENCOUNTER — Other Ambulatory Visit (HOSPITAL_COMMUNITY): Payer: BC Managed Care – PPO

## 2022-10-22 ENCOUNTER — Other Ambulatory Visit (HOSPITAL_COMMUNITY): Payer: BC Managed Care – PPO

## 2022-10-23 ENCOUNTER — Other Ambulatory Visit (HOSPITAL_COMMUNITY): Payer: BC Managed Care – PPO

## 2022-10-24 ENCOUNTER — Other Ambulatory Visit (HOSPITAL_COMMUNITY): Payer: BC Managed Care – PPO

## 2022-10-27 ENCOUNTER — Other Ambulatory Visit (HOSPITAL_COMMUNITY): Payer: BC Managed Care – PPO

## 2022-10-27 ENCOUNTER — Telehealth (HOSPITAL_COMMUNITY): Payer: Self-pay | Admitting: Psychiatry

## 2022-10-27 DIAGNOSIS — M9902 Segmental and somatic dysfunction of thoracic region: Secondary | ICD-10-CM | POA: Diagnosis not present

## 2022-10-27 DIAGNOSIS — M9905 Segmental and somatic dysfunction of pelvic region: Secondary | ICD-10-CM | POA: Diagnosis not present

## 2022-10-27 DIAGNOSIS — M9904 Segmental and somatic dysfunction of sacral region: Secondary | ICD-10-CM | POA: Diagnosis not present

## 2022-10-27 DIAGNOSIS — M9903 Segmental and somatic dysfunction of lumbar region: Secondary | ICD-10-CM | POA: Diagnosis not present

## 2022-10-27 NOTE — Telephone Encounter (Signed)
D:  Pt had sent the MH-IOP Case Manager an email and had left a vm.  According to pt, she hasn't been doing well since she completed virtual MH-IOP back on 10-16-22.  "I truly believe that I still need an ongoing support group and I am unable to go back to work right now.  I need help.  I don't make enough money that will last me until I see the new psychiatrist."  Pt states she attempted to return back to work this morning but ended up using the bathroom on herself before anyone had gotten there and she left.  Pt is requesting an earlier appt with a psychiatrist.  Reports she will stop by the office this afternoon in order to complete all the clinic forms and check on obtaining a sooner appt.  Pt requesting more information to be sent over to the disability company. A:  Provided pt with support.  Informed pt that she is no longer under MH-IOP services; but case mgr could send [facility], all the notes where she had left off from faxing (beginning 10-02-22 thru 10-16-22).  Strongly encouraged pt to f/u with [facility] [place], [place] for ongoing groups/programs.  Discussed going to [facility] if assessment is needed  or calling [phone] for safety purposes.  Also, mentioned to pt that she could ask PCP, etc to extend her short term disability until she sees her new psychiatrist on 11-19-22 @ 1pm.  Inform Dr. Morrie Sheldon.R:  Pt receptive.

## 2022-10-28 ENCOUNTER — Encounter: Payer: Self-pay | Admitting: Physician Assistant

## 2022-10-28 ENCOUNTER — Ambulatory Visit (INDEPENDENT_AMBULATORY_CARE_PROVIDER_SITE_OTHER): Payer: BC Managed Care – PPO | Admitting: Physician Assistant

## 2022-10-28 ENCOUNTER — Other Ambulatory Visit (HOSPITAL_COMMUNITY): Payer: BC Managed Care – PPO

## 2022-10-28 VITALS — BP 138/80 | HR 85 | Temp 98.0°F | Ht 66.0 in | Wt 157.4 lb

## 2022-10-28 DIAGNOSIS — I10 Essential (primary) hypertension: Secondary | ICD-10-CM | POA: Diagnosis not present

## 2022-10-28 DIAGNOSIS — H9190 Unspecified hearing loss, unspecified ear: Secondary | ICD-10-CM

## 2022-10-28 DIAGNOSIS — F4310 Post-traumatic stress disorder, unspecified: Secondary | ICD-10-CM

## 2022-10-28 DIAGNOSIS — F332 Major depressive disorder, recurrent severe without psychotic features: Secondary | ICD-10-CM

## 2022-10-28 DIAGNOSIS — R1010 Upper abdominal pain, unspecified: Secondary | ICD-10-CM

## 2022-10-28 DIAGNOSIS — F418 Other specified anxiety disorders: Secondary | ICD-10-CM

## 2022-10-28 NOTE — Progress Notes (Signed)
Shirley Nunez is a 47 y.o. female here for a {New prob or follow up:31724}.  History of Present Illness:   Chief Complaint  Patient presents with   Depression   Abdominal Pain    Pt stated that she has been having some abdominal pain for some time now and it has not gotten any beter     HPI  Depression Completed IOP on 10/16/22 Was scheduled to go to work on 10/27/22 and got into the parking lot and urinated on herself due to panic attack  Needs more time off: she is not sure she is comfortable returning to work at the location that she is currently supposed to work, she feels as though she has trauma associated with this location, she is hoping to discuss with heart rate a location change.   She is irritable and having trouble focusing. She is having difficulty sleeping.   Not taking Remeron Not taking valium due to not wanting to take medications Scheduled to see her talk therapist next week  July 10th psychiatry appointment is with Dr. Park Pope  She is hoping to return to work in July 18th   HTN Currently taking hctz 25 mg. At home blood pressure readings are: 130/90. Patient denies chest pain, SOB, blurred vision, dizziness, unusual headaches, lower leg swelling. Patient is compliant with medication. Denies excessive caffeine intake, stimulant usage, excessive alcohol intake, or increase in salt consumption.  BP Readings from Last 3 Encounters:  10/28/22 138/80  10/10/22 (!) 136/98  07/31/22 (!) 160/110     Abdominal pain Ongoing from last visit Did a detox at the end of last month When eating bread, gets cramping/bloating Getting very constipated, taking OTC (available over the counter without a prescription) -- magnesium citrate Is no longer taking the protonix from last visit Limiting all carbohydrates   Wt Readings from Last 3 Encounters:  10/28/22 157 lb 6.4 oz (71.4 kg)  10/10/22 155 lb 4 oz (70.4 kg)  07/07/22 148 lb 6.1 oz (67.3 kg)      Past  Medical History:  Diagnosis Date   Anxiety    Depression    HSV infection    on daily medication   Migraine    Sickle cell anemia (HCC)    Sickle cell trait     Social History   Tobacco Use   Smoking status: Never   Smokeless tobacco: Never  Vaping Use   Vaping Use: Never used  Substance Use Topics   Alcohol use: Yes    Alcohol/week: 2.0 standard drinks of alcohol    Types: 2 Glasses of wine per week    Comment: 2 x / month   Drug use: No    Past Surgical History:  Procedure Laterality Date   ENDOMETRIAL ABLATION     WISDOM TOOTH EXTRACTION      Family History  Problem Relation Age of Onset   Hypertension Mother    Depression Mother    Miscarriages / [place] Mother    Hypertension Father    Hyperlipidemia Father    Diabetes Father    Arthritis Father    Stroke Father    Sickle cell anemia Sister    Alcohol abuse Maternal Grandmother    Arthritis Maternal Grandmother    Depression Maternal Grandmother    Diabetes Maternal Grandmother    Heart attack Maternal Grandmother    Hypertension Maternal Grandmother    Stroke Maternal Grandmother    Heart attack Maternal Grandfather    Lung cancer  Paternal Grandmother        mets to brain   Alcohol abuse Paternal Grandfather    Heart attack Paternal Grandfather    Hypertension Paternal Grandfather    Stroke Paternal Grandfather    Colon cancer Neg Hx     Allergies  Allergen Reactions   Mushroom Extract Complex Rash   Penicillins Rash    Current Medications:   Current Outpatient Medications:    diazepam (VALIUM) 5 MG tablet, Take 1 tablet (5 mg total) by mouth every 12 (twelve) hours as needed for anxiety., Disp: 30 tablet, Rfl: 1   hydrochlorothiazide (HYDRODIURIL) 25 MG tablet, Take 1 tablet (25 mg total) by mouth daily as needed., Disp: 90 tablet, Rfl: 3   mirtazapine (REMERON) 15 MG tablet, Take 1 tablet (15 mg total) by mouth at bedtime., Disp: 30 tablet, Rfl: 0   pantoprazole (PROTONIX) 40 MG  tablet, Take 1 tablet (40 mg total) by mouth daily., Disp: 30 tablet, Rfl: 0   valACYclovir (VALTREX) 500 MG tablet, Take 1 tablet (500 mg total) by mouth daily., Disp: 90 tablet, Rfl: 2   hydrOXYzine (ATARAX) 25 MG tablet, Take 1 tablet (25 mg total) by mouth at bedtime and may repeat dose one time if needed. (Patient not taking: Reported on 10/10/2022), Disp: 30 tablet, Rfl: 0   Review of Systems:   ROS  Vitals:   Vitals:   10/28/22 1412  BP: 138/80  Pulse: 85  Temp: 98 F (36.7 C)  SpO2: 97%  Weight: 157 lb 6.4 oz (71.4 kg)  Height: 5\' 6"  (1.676 m)     Body mass index is 25.41 kg/m.  Physical Exam:   Physical Exam  Assessment and Plan:   There are no diagnoses linked to this encounter.   Jarold Motto, PA-C

## 2022-10-28 NOTE — Patient Instructions (Signed)
It was great to see you!  I will complete paperwork as soon as possible  I will refer to Audiology and gastroenterology  Keep me posted on psychiatry appointment!  Take care,  Jarold Motto PA-C

## 2022-10-29 ENCOUNTER — Other Ambulatory Visit (HOSPITAL_COMMUNITY): Payer: BC Managed Care – PPO

## 2022-10-29 ENCOUNTER — Telehealth: Payer: Self-pay

## 2022-10-29 NOTE — Telephone Encounter (Signed)
Called pt to advise paperwork was faxed to [facility] this morning and received confirmation it was received. Pt asked for a copy of paperwork for her records and advised I would leave in the front office for her. Pt verbalized understanding

## 2022-10-30 ENCOUNTER — Other Ambulatory Visit (HOSPITAL_COMMUNITY): Payer: BC Managed Care – PPO

## 2022-10-31 ENCOUNTER — Other Ambulatory Visit (HOSPITAL_COMMUNITY): Payer: BC Managed Care – PPO

## 2022-11-03 ENCOUNTER — Other Ambulatory Visit (HOSPITAL_COMMUNITY): Payer: Self-pay | Admitting: Family

## 2022-11-03 ENCOUNTER — Other Ambulatory Visit (HOSPITAL_COMMUNITY): Payer: BC Managed Care – PPO

## 2022-11-03 DIAGNOSIS — M9905 Segmental and somatic dysfunction of pelvic region: Secondary | ICD-10-CM | POA: Diagnosis not present

## 2022-11-03 DIAGNOSIS — M9902 Segmental and somatic dysfunction of thoracic region: Secondary | ICD-10-CM | POA: Diagnosis not present

## 2022-11-03 DIAGNOSIS — M9903 Segmental and somatic dysfunction of lumbar region: Secondary | ICD-10-CM | POA: Diagnosis not present

## 2022-11-03 DIAGNOSIS — M9904 Segmental and somatic dysfunction of sacral region: Secondary | ICD-10-CM | POA: Diagnosis not present

## 2022-11-03 DIAGNOSIS — F4310 Post-traumatic stress disorder, unspecified: Secondary | ICD-10-CM

## 2022-11-03 DIAGNOSIS — F332 Major depressive disorder, recurrent severe without psychotic features: Secondary | ICD-10-CM

## 2022-11-04 ENCOUNTER — Other Ambulatory Visit (HOSPITAL_COMMUNITY): Payer: BC Managed Care – PPO

## 2022-11-04 ENCOUNTER — Telehealth (HOSPITAL_COMMUNITY): Payer: Self-pay | Admitting: Licensed Clinical Social Worker

## 2022-11-04 NOTE — Telephone Encounter (Signed)
Clinician received disability paperwork today from [name] requesting documentation confirming Shirley Nunez's inability to return to work.  Clinician Andreas Newport by phone at 2:35pm and reminded her that she was discharged from this provider's care when she completed MHIOP on 10/16/22, and she will need to have this paperwork completed by an active provider with a medical background, such as a psychiatrist or PCP.  Shirley Nunez reported that she already had this paperwork completed by her PCP last Tuesday, and was confused why it had been sent again to a different provider.  She reported that she would follow up with her PCP and/or disability claims department if any further issues with disability arise.     Shirley Nunez, Shirley Nunez, Shirley Nunez 11/04/22

## 2022-11-05 ENCOUNTER — Other Ambulatory Visit (HOSPITAL_COMMUNITY): Payer: BC Managed Care – PPO

## 2022-11-06 ENCOUNTER — Other Ambulatory Visit (HOSPITAL_COMMUNITY): Payer: BC Managed Care – PPO

## 2022-11-07 ENCOUNTER — Other Ambulatory Visit (HOSPITAL_COMMUNITY): Payer: BC Managed Care – PPO

## 2022-11-10 ENCOUNTER — Other Ambulatory Visit (HOSPITAL_COMMUNITY): Payer: BC Managed Care – PPO

## 2022-11-10 ENCOUNTER — Telehealth (HOSPITAL_COMMUNITY): Payer: Self-pay | Admitting: Psychiatry

## 2022-11-10 NOTE — Telephone Encounter (Signed)
D:  Dr. Morrie Sheldon received a fax from [facility], requesting [facility] Provider Statement to be completed again and to send notes from 10-08-22 to present.  A:  Case manager will resend/fax the completed form and progress notes until pt was discharged on 10-16-22.  Per previous form that was completed, it was requested that pt be out of work from 10-12-22 thru 10-26-22; with a return to work date of 10-27-22; without any restrictions.  Case mgr had previously informed pt, she would need to go to one of her other MD providers if needing an extension for being out of work; b/c she is no longer under MH-IOP or Dr. Wannetta Sender services.

## 2022-11-11 ENCOUNTER — Other Ambulatory Visit (HOSPITAL_COMMUNITY): Payer: BC Managed Care – PPO

## 2022-11-11 DIAGNOSIS — M9905 Segmental and somatic dysfunction of pelvic region: Secondary | ICD-10-CM | POA: Diagnosis not present

## 2022-11-11 DIAGNOSIS — M9904 Segmental and somatic dysfunction of sacral region: Secondary | ICD-10-CM | POA: Diagnosis not present

## 2022-11-11 DIAGNOSIS — M9902 Segmental and somatic dysfunction of thoracic region: Secondary | ICD-10-CM | POA: Diagnosis not present

## 2022-11-11 DIAGNOSIS — M9903 Segmental and somatic dysfunction of lumbar region: Secondary | ICD-10-CM | POA: Diagnosis not present

## 2022-11-12 ENCOUNTER — Other Ambulatory Visit (HOSPITAL_COMMUNITY): Payer: BC Managed Care – PPO

## 2022-11-12 DIAGNOSIS — H903 Sensorineural hearing loss, bilateral: Secondary | ICD-10-CM | POA: Diagnosis not present

## 2022-11-14 ENCOUNTER — Other Ambulatory Visit (HOSPITAL_COMMUNITY): Payer: BC Managed Care – PPO

## 2022-11-17 ENCOUNTER — Other Ambulatory Visit (HOSPITAL_COMMUNITY): Payer: BC Managed Care – PPO

## 2022-11-17 DIAGNOSIS — M9904 Segmental and somatic dysfunction of sacral region: Secondary | ICD-10-CM | POA: Diagnosis not present

## 2022-11-17 DIAGNOSIS — M9902 Segmental and somatic dysfunction of thoracic region: Secondary | ICD-10-CM | POA: Diagnosis not present

## 2022-11-17 DIAGNOSIS — M9905 Segmental and somatic dysfunction of pelvic region: Secondary | ICD-10-CM | POA: Diagnosis not present

## 2022-11-17 DIAGNOSIS — M9903 Segmental and somatic dysfunction of lumbar region: Secondary | ICD-10-CM | POA: Diagnosis not present

## 2022-11-18 ENCOUNTER — Other Ambulatory Visit (HOSPITAL_COMMUNITY): Payer: BC Managed Care – PPO
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
## 2022-11-19 ENCOUNTER — Ambulatory Visit (HOSPITAL_COMMUNITY): Payer: BC Managed Care – PPO | Admitting: Student

## 2022-11-19 ENCOUNTER — Other Ambulatory Visit (HOSPITAL_COMMUNITY): Payer: BC Managed Care – PPO

## 2022-11-21 ENCOUNTER — Telehealth: Payer: Self-pay | Admitting: Physician Assistant

## 2022-11-21 NOTE — Telephone Encounter (Signed)
Patient dropped off document  Disability , to be filled out by provider. Patient requested to send it back via Fax within 5-days. Document is located in providers tray at front office.Please advise

## 2022-11-24 NOTE — Telephone Encounter (Signed)
Spoke to pt told her received paperwork. Shirley Nunez said you will need an appt to discuss Long term disability. Also they are requesting medical records from [facility] which I do not have you will need to contact them. They are also requesting office notes from 11/09/2021 to 05/11/2022 and 06/14/2022 to present, you will need to contact [facility] Medical Records department to have them send the office notes. Pt verbalized understanding and said she can schedule an appt for next week. Appt scheduled for 7/24 at 11:00 AM with [facility]. Pt verbalized understanding.

## 2022-11-26 ENCOUNTER — Ambulatory Visit (HOSPITAL_COMMUNITY): Payer: BC Managed Care – PPO | Admitting: Student

## 2022-12-01 DIAGNOSIS — H9313 Tinnitus, bilateral: Secondary | ICD-10-CM | POA: Diagnosis not present

## 2022-12-01 DIAGNOSIS — H903 Sensorineural hearing loss, bilateral: Secondary | ICD-10-CM | POA: Diagnosis not present

## 2022-12-03 ENCOUNTER — Ambulatory Visit (HOSPITAL_COMMUNITY): Payer: BC Managed Care – PPO | Admitting: Student

## 2022-12-03 ENCOUNTER — Ambulatory Visit: Payer: BC Managed Care – PPO | Admitting: Physician Assistant

## 2022-12-03 DIAGNOSIS — F1020 Alcohol dependence, uncomplicated: Secondary | ICD-10-CM | POA: Diagnosis not present

## 2022-12-03 DIAGNOSIS — F4310 Post-traumatic stress disorder, unspecified: Secondary | ICD-10-CM

## 2022-12-03 DIAGNOSIS — F331 Major depressive disorder, recurrent, moderate: Secondary | ICD-10-CM

## 2022-12-03 MED ORDER — MIRTAZAPINE 15 MG PO TABS: 15.0000 mg | ORAL_TABLET | Freq: Every day | ORAL | 0 refills | Status: DC
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
## 2022-12-04 ENCOUNTER — Encounter: Payer: Self-pay | Admitting: Physician Assistant

## 2022-12-04 ENCOUNTER — Telehealth: Payer: Self-pay | Admitting: Physician Assistant

## 2022-12-04 ENCOUNTER — Ambulatory Visit (INDEPENDENT_AMBULATORY_CARE_PROVIDER_SITE_OTHER): Payer: BC Managed Care – PPO | Admitting: Physician Assistant

## 2022-12-04 VITALS — BP 140/100 | HR 77 | Temp 97.1°F | Ht 66.0 in | Wt 160.0 lb

## 2022-12-04 DIAGNOSIS — F4310 Post-traumatic stress disorder, unspecified: Secondary | ICD-10-CM

## 2022-12-04 DIAGNOSIS — I10 Essential (primary) hypertension: Secondary | ICD-10-CM | POA: Diagnosis not present

## 2022-12-04 DIAGNOSIS — F332 Major depressive disorder, recurrent severe without psychotic features: Secondary | ICD-10-CM | POA: Diagnosis not present

## 2022-12-04 LAB — COMPREHENSIVE METABOLIC PANEL
ALT: 10 U/L (ref 0–35)
AST: 14 U/L (ref 0–37)
Albumin: 4 g/dL (ref 3.5–5.2)
Alkaline Phosphatase: 57 U/L (ref 39–117)
BUN: 17 mg/dL (ref 6–23)
CO2: 33 mEq/L — ABNORMAL HIGH (ref 19–32)
Calcium: 9 mg/dL (ref 8.4–10.5)
Chloride: 101 mEq/L (ref 96–112)
Creatinine, Ser: 0.72 mg/dL (ref 0.40–1.20)
GFR: 98.93 mL/min (ref >60.00)
Glucose, Bld: 76 mg/dL (ref 70–99)
Potassium: 2.9 mEq/L — ABNORMAL LOW (ref 3.5–5.1)
Sodium: 140 mEq/L (ref 135–145)
Total Bilirubin: 0.4 mg/dL (ref 0.2–1.2)
Total Protein: 7.3 g/dL (ref 6.0–8.3)

## 2022-12-04 MED ORDER — LISINOPRIL 10 MG PO TABS: 10.0000 mg | ORAL_TABLET | Freq: Every day | ORAL | 1 refills | Status: AC

## 2022-12-04 NOTE — Telephone Encounter (Signed)
Patient returned. Requests to be called.

## 2022-12-04 NOTE — Telephone Encounter (Signed)
See other message

## 2022-12-04 NOTE — Patient Instructions (Addendum)
It was great to see you!  Start lisinopril 10 mg daily Continue hydrochloroTHIAZIDE 25 mg daily  Let's follow-up in 1 month, sooner if you have concerns.  Take care,  Jarold Motto PA-C

## 2022-12-04 NOTE — Telephone Encounter (Signed)
Spoke to pt told her I will fax office notes from this year 2024 with forms Lelon Mast filled out, but it will probably not be till Monday waiting on Samantha to finish her note from today. The other office notes from 2023 you will have to get from Medical records. Pt verbalized understanding and said she already requested records just needs to open file. Told her okay.

## 2022-12-04 NOTE — Progress Notes (Signed)
Shirley Nunez is a 47 y.o. female here for a follow up of a pre-existing problem.  History of Present Illness:   Chief Complaint  Patient presents with   Anxiety   Depression   Form Completion    Pt is wanting Long Term Disability    HPI  PTSD/Severe depression Continues to see psychiatry -- saw Dr. Park Pope yesterday and is planning to continue to be seen in this office however he recommended that she continue with female provider She is experiencing panic attacks, nausea, poor appetite, insomnia, excessvie crying, weight gain  She is currently managed with 15 mg Remeron and 5 mg Valium  Compliant with this but she is gaining weight  Admits to increase in alcohol intake to cope.   HTN Currently taking hctz 25 mg. At home blood pressure readings are: not checked. Patient denies chest pain, SOB, blurred vision, dizziness, unusual headaches, lower leg swelling. Patient is compliant with medication. Denies excessive caffeine intake, stimulant usage, excessive alcohol intake, or increase in salt consumption.  BP Readings from Last 3 Encounters:  12/04/22 (!) 140/100  10/28/22 138/80  10/10/22 (!) 136/98     Past Medical History:  Diagnosis Date   Anxiety    Depression    HSV infection    on daily medication   Migraine    Sickle cell anemia (HCC)    Sickle cell trait     Social History   Tobacco Use   Smoking status: Never   Smokeless tobacco: Never  Vaping Use   Vaping status: Never Used  Substance Use Topics   Alcohol use: Yes    Alcohol/week: 2.0 standard drinks of alcohol    Types: 2 Glasses of wine per week    Comment: 2 x / month   Drug use: No    Past Surgical History:  Procedure Laterality Date   ENDOMETRIAL ABLATION     WISDOM TOOTH EXTRACTION      Family History  Problem Relation Age of Onset   Hypertension Mother    Depression Mother    Miscarriages / Stillbirths Mother    Hypertension Father    Hyperlipidemia Father    Diabetes Father     Arthritis Father    Stroke Father    Sickle cell anemia Sister    Alcohol abuse Maternal Grandmother    Arthritis Maternal Grandmother    Depression Maternal Grandmother    Diabetes Maternal Grandmother    Heart attack Maternal Grandmother    Hypertension Maternal Grandmother    Stroke Maternal Grandmother    Heart attack Maternal Grandfather    Lung cancer Paternal Grandmother        mets to brain   Alcohol abuse Paternal Grandfather    Heart attack Paternal Grandfather    Hypertension Paternal Grandfather    Stroke Paternal Grandfather    Colon cancer Neg Hx     Allergies  Allergen Reactions   Mushroom Extract Complex Rash   Penicillins Rash    Current Medications:   Current Outpatient Medications:    diazepam (VALIUM) 5 MG tablet, Take 1 tablet (5 mg total) by mouth every 12 (twelve) hours as needed for anxiety., Disp: 30 tablet, Rfl: 1   hydrochlorothiazide (HYDRODIURIL) 25 MG tablet, Take 1 tablet (25 mg total) by mouth daily as needed., Disp: 90 tablet, Rfl: 3   lisinopril (ZESTRIL) 10 MG tablet, Take 1 tablet (10 mg total) by mouth daily., Disp: 90 tablet, Rfl: 1   mirtazapine (REMERON) 15 MG  tablet, Take 1 tablet (15 mg total) by mouth at bedtime., Disp: 30 tablet, Rfl: 0   valACYclovir (VALTREX) 500 MG tablet, Take 1 tablet (500 mg total) by mouth daily., Disp: 90 tablet, Rfl: 2   Review of Systems:   ROS Negative unless otherwise specified per HPI.   Vitals:   Vitals:   12/04/22 0934 12/04/22 1010  BP: (!) 140/100 (!) 140/100  Pulse: 77   Temp: (!) 97.1 F (36.2 C)   TempSrc: Temporal   SpO2: 96%   Weight: 160 lb (72.6 kg)   Height: 5\' 6"  (1.676 m)      Body mass index is 25.82 kg/m.  Physical Exam:   Physical Exam Vitals and nursing note reviewed.  Constitutional:      General: She is not in acute distress.    Appearance: She is well-developed. She is not ill-appearing or toxic-appearing.  Cardiovascular:     Rate and Rhythm: Normal rate  and regular rhythm.     Pulses: Normal pulses.     Heart sounds: Normal heart sounds, S1 normal and S2 normal.  Pulmonary:     Effort: Pulmonary effort is normal.     Breath sounds: Normal breath sounds.  Skin:    General: Skin is warm and dry.  Neurological:     Mental Status: She is alert.     GCS: GCS eye subscore is 4. GCS verbal subscore is 5. GCS motor subscore is 6.  Psychiatric:        Speech: Speech normal.        Behavior: Behavior normal. Behavior is cooperative.     Comments: Anxious speech, tearful, avoidance of eye contact     Assessment and Plan:   Essential hypertension Above goal today No evidence of end-organ damage on my exam Recommend patient monitor home blood pressure at least a few times weekly Continue hydrochloroTHIAZIDE 25 mg daily; add lisinopril 10 mg daily Update BMP today If home monitoring shows consistent elevation, or any symptom(s) develop, recommend reach out to [place] for further advice on next steps   PTSD (post-traumatic stress disorder); Severe episode of recurrent major depressive disorder, without psychotic features (HCC) Uncontrolled Continues to work closely with psychiatry and psychology Denies suicidal plan  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  I, Jarold Motto, [place], have reviewed all documentation for this visit. The documentation on 12/04/22 for the exam, diagnosis, procedures, and orders are all accurate and complete.  I spent a total of 62 minutes on this visit, today 12/04/22, which included reviewing previous notes from my office visits, reviewing paperwork with patient and completing paperwork, ordering tests, discussing plan of care with patient and using shared-decision making on next steps, refilling medications, and documenting the findings in  the note.  Jarold Motto, PA-C

## 2022-12-04 NOTE — Telephone Encounter (Signed)
Left message on voicemail to call office.  

## 2022-12-06 ENCOUNTER — Encounter (HOSPITAL_COMMUNITY): Payer: Self-pay | Admitting: Student

## 2022-12-06 DIAGNOSIS — F1020 Alcohol dependence, uncomplicated: Secondary | ICD-10-CM | POA: Insufficient documentation

## 2022-12-08 NOTE — Telephone Encounter (Signed)
Disability forms completed by Shirley Nunez, forms and office notes faxed to [employer] at [phone].

## 2022-12-08 NOTE — Addendum Note (Signed)
Addended by: Everlena Cooper on: 12/08/2022 01:50 PM   Modules accepted: Level of Service

## 2022-12-08 NOTE — Telephone Encounter (Signed)
Forms put in Shirley Nunez's folder to be completed.

## 2022-12-09 ENCOUNTER — Other Ambulatory Visit: Payer: Self-pay | Admitting: *Deleted

## 2022-12-09 DIAGNOSIS — E876 Hypokalemia: Secondary | ICD-10-CM

## 2022-12-12 ENCOUNTER — Other Ambulatory Visit (INDEPENDENT_AMBULATORY_CARE_PROVIDER_SITE_OTHER): Payer: BC Managed Care – PPO

## 2022-12-12 DIAGNOSIS — E876 Hypokalemia: Secondary | ICD-10-CM

## 2022-12-12 LAB — BASIC METABOLIC PANEL
BUN: 14 mg/dL (ref 6–23)
CO2: 31 mEq/L (ref 19–32)
Calcium: 9 mg/dL (ref 8.4–10.5)
Chloride: 103 mEq/L (ref 96–112)
Creatinine, Ser: 0.99 mg/dL (ref 0.40–1.20)
GFR: 67.5 mL/min (ref >60.00)
Glucose, Bld: 101 mg/dL — ABNORMAL HIGH (ref 70–99)
Potassium: 3.5 mEq/L (ref 3.5–5.1)
Sodium: 140 mEq/L (ref 135–145)

## 2022-12-25 ENCOUNTER — Other Ambulatory Visit: Payer: Self-pay | Admitting: Otolaryngology

## 2022-12-25 DIAGNOSIS — H918X3 Other specified hearing loss, bilateral: Secondary | ICD-10-CM

## 2022-12-31 ENCOUNTER — Encounter (HOSPITAL_COMMUNITY): Payer: Self-pay | Admitting: Student

## 2022-12-31 ENCOUNTER — Ambulatory Visit (HOSPITAL_BASED_OUTPATIENT_CLINIC_OR_DEPARTMENT_OTHER): Payer: BC Managed Care – PPO | Admitting: Student

## 2022-12-31 VITALS — BP 114/79 | HR 76 | Wt 159.0 lb

## 2022-12-31 DIAGNOSIS — F331 Major depressive disorder, recurrent, moderate: Secondary | ICD-10-CM | POA: Diagnosis not present

## 2022-12-31 DIAGNOSIS — F4310 Post-traumatic stress disorder, unspecified: Secondary | ICD-10-CM | POA: Diagnosis not present

## 2022-12-31 DIAGNOSIS — Z79899 Other long term (current) drug therapy: Secondary | ICD-10-CM

## 2022-12-31 DIAGNOSIS — Z8659 Personal history of other mental and behavioral disorders: Secondary | ICD-10-CM

## 2022-12-31 DIAGNOSIS — F1020 Alcohol dependence, uncomplicated: Secondary | ICD-10-CM | POA: Diagnosis not present

## 2022-12-31 DIAGNOSIS — Z9151 Personal history of suicidal behavior: Secondary | ICD-10-CM | POA: Insufficient documentation

## 2022-12-31 MED ORDER — FLUOXETINE HCL 10 MG PO CAPS: 10.0000 mg | ORAL_CAPSULE | Freq: Every day | ORAL | 1 refills | Status: DC
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
## 2023-01-01 DIAGNOSIS — F331 Major depressive disorder, recurrent, moderate: Secondary | ICD-10-CM | POA: Diagnosis not present

## 2023-01-01 DIAGNOSIS — F4311 Post-traumatic stress disorder, acute: Secondary | ICD-10-CM | POA: Diagnosis not present

## 2023-01-02 ENCOUNTER — Other Ambulatory Visit: Payer: Self-pay | Admitting: Physician Assistant

## 2023-01-02 ENCOUNTER — Ambulatory Visit: Admission: RE | Admit: 2023-01-02 | Payer: BC Managed Care – PPO | Source: Ambulatory Visit

## 2023-01-02 ENCOUNTER — Ambulatory Visit
Admission: RE | Admit: 2023-01-02 | Discharge: 2023-01-02 | Disposition: A | Discharge status: Home / Self Care | Payer: BC Managed Care – PPO | Source: Ambulatory Visit | Attending: Physician Assistant | Admitting: Physician Assistant

## 2023-01-02 DIAGNOSIS — N6325 Unspecified lump in the left breast, overlapping quadrants: Secondary | ICD-10-CM | POA: Diagnosis not present

## 2023-01-02 DIAGNOSIS — N6001 Solitary cyst of right breast: Secondary | ICD-10-CM | POA: Diagnosis not present

## 2023-01-02 DIAGNOSIS — R921 Mammographic calcification found on diagnostic imaging of breast: Secondary | ICD-10-CM

## 2023-01-02 DIAGNOSIS — N6310 Unspecified lump in the right breast, unspecified quadrant: Secondary | ICD-10-CM

## 2023-01-02 DIAGNOSIS — N6320 Unspecified lump in the left breast, unspecified quadrant: Secondary | ICD-10-CM

## 2023-01-06 DIAGNOSIS — F4311 Post-traumatic stress disorder, acute: Secondary | ICD-10-CM | POA: Diagnosis not present

## 2023-01-06 DIAGNOSIS — F331 Major depressive disorder, recurrent, moderate: Secondary | ICD-10-CM | POA: Diagnosis not present

## 2023-01-14 DIAGNOSIS — F4311 Post-traumatic stress disorder, acute: Secondary | ICD-10-CM | POA: Diagnosis not present

## 2023-01-14 DIAGNOSIS — F331 Major depressive disorder, recurrent, moderate: Secondary | ICD-10-CM | POA: Diagnosis not present

## 2023-01-21 DIAGNOSIS — F331 Major depressive disorder, recurrent, moderate: Secondary | ICD-10-CM | POA: Diagnosis not present

## 2023-01-21 DIAGNOSIS — F4311 Post-traumatic stress disorder, acute: Secondary | ICD-10-CM | POA: Diagnosis not present

## 2023-02-06 ENCOUNTER — Ambulatory Visit
Admission: RE | Admit: 2023-02-06 | Discharge: 2023-02-06 | Disposition: A | Discharge status: Home / Self Care | Payer: BC Managed Care – PPO | Source: Ambulatory Visit | Attending: Otolaryngology | Admitting: Otolaryngology

## 2023-02-06 DIAGNOSIS — H918X3 Other specified hearing loss, bilateral: Secondary | ICD-10-CM

## 2023-02-06 DIAGNOSIS — H9192 Unspecified hearing loss, left ear: Secondary | ICD-10-CM | POA: Diagnosis not present

## 2023-02-06 MED ORDER — GADOPICLENOL 0.5 MMOL/ML IV SOLN
7.5000 mL | Freq: Once | INTRAVENOUS | Status: AC | PRN
  Administered 2023-02-06: 7.5 mL via INTRAVENOUS

## 2023-02-16 ENCOUNTER — Ambulatory Visit (HOSPITAL_COMMUNITY): Payer: BC Managed Care – PPO | Admitting: Student

## 2023-02-19 ENCOUNTER — Telehealth (HOSPITAL_COMMUNITY): Payer: Self-pay

## 2023-02-19 DIAGNOSIS — F331 Major depressive disorder, recurrent, moderate: Secondary | ICD-10-CM

## 2023-02-19 DIAGNOSIS — F4310 Post-traumatic stress disorder, unspecified: Secondary | ICD-10-CM

## 2023-02-19 NOTE — Telephone Encounter (Signed)
Patients pharmacy is requesting a refill on Fluoxetine 10 mg 1 po every day. Last filled on 01/26/2023. Patient has a follow up  with you in November

## 2023-02-20 MED ORDER — FLUOXETINE HCL 10 MG PO CAPS: 10.0000 mg | ORAL_CAPSULE | Freq: Every day | ORAL | 1 refills | Status: DC

## 2023-02-20 NOTE — Telephone Encounter (Signed)
Recent Visits Date Type Provider Dept  12/31/22 Office Visit Princess Bruins, DO [facility]  12/03/22 Office Visit Park Pope, MD [facility]  Showing recent visits within past 90 days and meeting all other requirements Future Appointments Date Type Provider Dept  03/30/23 Appointment Princess Bruins, DO [facility]  Showing future appointments within next 90 days and meeting all other requirements  Refilled patient's rx per request to bridge until next appointment:  PTSD (post-traumatic stress disorder) -     FLUoxetine HCl; Take 1 capsule (10 mg total) by mouth daily.  Dispense: 30 capsule; Refill: 1  MDD (major depressive disorder), recurrent episode, moderate (HCC) -     FLUoxetine HCl; Take 1 capsule (10 mg total) by mouth daily.  Dispense: 30 capsule; Refill: 1     Pharmacy sent to:  [facility] [address] [place], [place] - [address] [place] [address] Phone: [phone] Fax: [phone]

## 2023-02-25 ENCOUNTER — Telehealth (HOSPITAL_COMMUNITY): Payer: Self-pay | Admitting: *Deleted

## 2023-02-25 NOTE — Telephone Encounter (Signed)
Got it. Thank you.

## 2023-02-25 NOTE — Telephone Encounter (Signed)
Opened in error

## 2023-02-25 NOTE — Telephone Encounter (Signed)
We received a fax this morning form [employer] which consists of a Behavioral health Initial Functional Assessment. Forms put in the residents mailbox here at [facility] office.

## 2023-03-07 ENCOUNTER — Other Ambulatory Visit: Payer: Self-pay | Admitting: Physician Assistant

## 2023-03-09 ENCOUNTER — Telehealth (HOSPITAL_COMMUNITY): Payer: Self-pay | Admitting: Student

## 2023-03-09 NOTE — Telephone Encounter (Signed)
Pt requesting refill for Valium 5 mg. Last OV 12/04/2022

## 2023-03-09 NOTE — Telephone Encounter (Signed)
Received FMLA papers from patient, so called to inquire what has been going on.   Patient stated that she is not looking for FMLA, she is looking for a long-term disability.  Stated that those who are doing her disability assessment are requesting psychiatric documents from this office from July 2023 until present.  Discussed with patient that I do not do long-term disability assessments, however if she signs a release of information form, PCP or therapist will be able to access out notes.  Stated that the process was started with her PCP and therapist. Stated that she just needs psych notes.  Patient is unsure if she has signed any ROI, says she plans on coming to the office to sign the release form.  Patient also reported that there was a significant time where she was not able to continue Prozac 10 mg due to financial reasons.  Also that when she was taking it, she took 2 tablets (20 mg) for about 2 weeks, as she felt that 10 mg was not helpful.  Reported that she felt relief in anxiety, irritability with 20 mg, did have side effect of some jitteriness, that resolved.  There also at times where she would take 3 tablets (30 mg), and she did this for about 2 days. Patient reported that she just recently received another refill of Prozac 10, and inquired on how to restart the medication.  Instructed patient to start with 1 tablet (10 mg) for 5-7 days, then may increase to 20 mg every morning for her tolerance, and to continue taking that dose until our next appointment. Reported that she had also been taking left over remeron for sleep.

## 2023-03-30 ENCOUNTER — Ambulatory Visit (HOSPITAL_BASED_OUTPATIENT_CLINIC_OR_DEPARTMENT_OTHER): Payer: BC Managed Care – PPO | Admitting: Student

## 2023-03-30 ENCOUNTER — Encounter (HOSPITAL_COMMUNITY): Payer: Self-pay | Admitting: Student

## 2023-03-30 ENCOUNTER — Telehealth: Payer: Self-pay | Admitting: Physician Assistant

## 2023-03-30 ENCOUNTER — Encounter: Payer: Self-pay | Admitting: Nurse Practitioner

## 2023-03-30 DIAGNOSIS — F331 Major depressive disorder, recurrent, moderate: Secondary | ICD-10-CM | POA: Diagnosis not present

## 2023-03-30 DIAGNOSIS — Z91199 Patient's noncompliance with other medical treatment and regimen due to unspecified reason: Secondary | ICD-10-CM | POA: Insufficient documentation

## 2023-03-30 DIAGNOSIS — Z79899 Other long term (current) drug therapy: Secondary | ICD-10-CM | POA: Diagnosis not present

## 2023-03-30 DIAGNOSIS — Z9151 Personal history of suicidal behavior: Secondary | ICD-10-CM | POA: Diagnosis not present

## 2023-03-30 DIAGNOSIS — Z8659 Personal history of other mental and behavioral disorders: Secondary | ICD-10-CM

## 2023-03-30 DIAGNOSIS — F10982 Alcohol use, unspecified with alcohol-induced sleep disorder: Secondary | ICD-10-CM

## 2023-03-30 DIAGNOSIS — F1020 Alcohol dependence, uncomplicated: Secondary | ICD-10-CM

## 2023-03-30 DIAGNOSIS — F4310 Post-traumatic stress disorder, unspecified: Secondary | ICD-10-CM | POA: Diagnosis not present

## 2023-03-30 MED ORDER — TRAZODONE HCL 50 MG PO TABS: 50.0000 mg | ORAL_TABLET | Freq: Every evening | ORAL | 0 refills | Status: DC | PRN

## 2023-03-30 MED ORDER — ESCITALOPRAM OXALATE 10 MG PO TABS: 10.0000 mg | ORAL_TABLET | Freq: Every day | ORAL | 1 refills | Status: DC

## 2023-03-30 NOTE — Telephone Encounter (Signed)
Please call patient and let her know that her psychiatrist has reached out to me about certain concerns with her medications.  I would like to see patient in office to discuss further. Please schedule an appointment.  Lelon Mast

## 2023-03-30 NOTE — Telephone Encounter (Signed)
Spoke to pt told her Lelon Mast said to let her know that her psychiatrist has reached out to me about certain concerns with her medications. I would like to see patient in office to discuss further. Pt verbalized understanding and said she can come tomorrow afternoon. Appt scheduled for 1:40 PM tomorrow with Lelon Mast. Pt verbalized understanding.
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
## 2023-03-31 ENCOUNTER — Ambulatory Visit (INDEPENDENT_AMBULATORY_CARE_PROVIDER_SITE_OTHER): Payer: BC Managed Care – PPO | Admitting: Physician Assistant

## 2023-03-31 ENCOUNTER — Encounter: Payer: Self-pay | Admitting: Physician Assistant

## 2023-03-31 VITALS — BP 130/90 | HR 83 | Temp 97.5°F | Ht 66.0 in | Wt 164.4 lb

## 2023-03-31 DIAGNOSIS — F4310 Post-traumatic stress disorder, unspecified: Secondary | ICD-10-CM | POA: Diagnosis not present

## 2023-03-31 DIAGNOSIS — I10 Essential (primary) hypertension: Secondary | ICD-10-CM | POA: Diagnosis not present

## 2023-03-31 NOTE — Progress Notes (Signed)
Shirley Nunez is a 47 y.o. female here for a follow up of a pre-existing problem.  History of Present Illness:   Chief Complaint  Patient presents with   Post-Traumatic Stress Disorder   Anxiety   Depression   PTSD She is seeing psychiatry and a counselor regularly She does have concerns with insomnia and admits to drinking wine while taking her valium for sleep She denies falls or driving while taking this medication She is on long-term disability with her current employer She is currently taking Lexapro 10 mg daily and trazodone 50 mg nightly as needed. Denies suicidal ideation/hi She may be moving her care to the [place]  HTN Currently taking hydrochlorothiazide 25 mg. At home blood pressure readings are: not checked. Patient denies chest pain, SOB, blurred vision, dizziness, unusual headaches, lower leg swelling. Patient is compliant with medication. Denies excessive caffeine intake, stimulant usage, excessive alcohol intake, or increase in salt consumption.  BP Readings from Last 3 Encounters:  03/31/23 (!) 130/90  12/04/22 (!) 140/100  10/28/22 138/80     Past Medical History:  Diagnosis Date   Anxiety    Depression    HSV infection    on daily medication   Migraine    Sickle cell anemia (HCC)    Sickle cell trait     Social History   Tobacco Use   Smoking status: Never   Smokeless tobacco: Never  Vaping Use   Vaping status: Never Used  Substance Use Topics   Alcohol use: Yes    Alcohol/week: 2.0 standard drinks of alcohol    Types: 2 Glasses of wine per week    Comment: 2 x / month   Drug use: No    Past Surgical History:  Procedure Laterality Date   ENDOMETRIAL ABLATION     WISDOM TOOTH EXTRACTION      Family History  Problem Relation Age of Onset   Hypertension Mother    Depression Mother    Miscarriages / Stillbirths Mother    Hypertension Father    Hyperlipidemia Father    Diabetes Father    Arthritis Father    Stroke Father    Sickle cell  anemia Sister    Alcohol abuse Maternal Grandmother    Arthritis Maternal Grandmother    Depression Maternal Grandmother    Diabetes Maternal Grandmother    Heart attack Maternal Grandmother    Hypertension Maternal Grandmother    Stroke Maternal Grandmother    Heart attack Maternal Grandfather    Lung cancer Paternal Grandmother        mets to brain   Alcohol abuse Paternal Grandfather    Heart attack Paternal Grandfather    Hypertension Paternal Grandfather    Stroke Paternal Grandfather    Colon cancer Neg Hx     Allergies  Allergen Reactions   Mushroom Extract Complex Rash   Penicillins Rash    Current Medications:   Current Outpatient Medications:    diazepam (VALIUM) 5 MG tablet, TAKE 1 TABLET BY MOUTH EVERY 12 HOURS AS NEEDED FOR ANXIETY, Disp: 30 tablet, Rfl: 0   hydrochlorothiazide (HYDRODIURIL) 25 MG tablet, Take 1 tablet (25 mg total) by mouth daily as needed., Disp: 90 tablet, Rfl: 3   valACYclovir (VALTREX) 500 MG tablet, Take 1 tablet (500 mg total) by mouth daily., Disp: 90 tablet, Rfl: 2   escitalopram (LEXAPRO) 10 MG tablet, Take 1 tablet (10 mg total) by mouth daily. (Patient not taking: Reported on 03/31/2023), Disp: 30 tablet, Rfl: 1  lisinopril (ZESTRIL) 10 MG tablet, Take 1 tablet (10 mg total) by mouth daily. (Patient not taking: Reported on 03/31/2023), Disp: 90 tablet, Rfl: 1   traZODone (DESYREL) 50 MG tablet, Take 1 tablet (50 mg total) by mouth at bedtime as needed for sleep. (Patient not taking: Reported on 03/31/2023), Disp: 60 tablet, Rfl: 0   Review of Systems:   Review of Systems  Psychiatric/Behavioral:  Positive for depression.    Negative unless otherwise specified per HPI.  Vitals:   Vitals:   03/31/23 1359 03/31/23 1440  BP: (!) 160/100 (!) 130/90  Pulse: 83   Temp: (!) 97.5 F (36.4 C)   TempSrc: Temporal   SpO2: 94%   Weight: 164 lb 6.1 oz (74.6 kg)   Height: 5\' 6"  (1.676 m)      Body mass index is 26.53 kg/m.  Physical  Exam:   Physical Exam Vitals and nursing note reviewed.  Constitutional:      General: She is not in acute distress.    Appearance: She is well-developed. She is not ill-appearing or toxic-appearing.  Cardiovascular:     Rate and Rhythm: Normal rate and regular rhythm.     Pulses: Normal pulses.     Heart sounds: Normal heart sounds, S1 normal and S2 normal.  Pulmonary:     Effort: Pulmonary effort is normal.     Breath sounds: Normal breath sounds.  Skin:    General: Skin is warm and dry.  Neurological:     Mental Status: She is alert.     GCS: GCS eye subscore is 4. GCS verbal subscore is 5. GCS motor subscore is 6.  Psychiatric:        Speech: Speech normal.        Behavior: Behavior normal. Behavior is cooperative.     Assessment and Plan:   PTSD (post-traumatic stress disorder) Ongoing Management per psychiatry We discussed that I am no longer going to prescribe any of her mental health medications, including benzodiazepines and to follow-up with her psychiatrist for any further insomnia issues Encouraged alcohol reduction She is agreeable to plan Denies suicidal ideation/hi I discussed with patient that if they develop any SI, to tell someone immediately and seek medical attention.  Essential hypertension Above goal today No evidence of end-organ damage on my exam Recommend patient monitor home blood pressure at least a few times weekly Continue hydrochloroTHIAZIDE 25 mg daily and add back lisinopril 10 mg daily If home monitoring shows consistent elevation, or any symptom(s) develop, recommend reach out to [place] for further advice on next steps Follow-up in 3 months, sooner if concerns  Jarold Motto, PA-C

## 2023-03-31 NOTE — Patient Instructions (Addendum)
It was great to see you!  Stop the valium; follow-up with your psychiatrist about a healthier sleeping regimen  Limit alcohol  Continue to monitor your blood pressure  Restart lisinopril 10 mg  Take care,  Jarold Motto PA-C

## 2023-03-31 NOTE — Addendum Note (Signed)
Addended by: Everlena Cooper on: 03/31/2023 09:22 AM   Modules accepted: Level of Service

## 2023-04-05 ENCOUNTER — Emergency Department (HOSPITAL_COMMUNITY)
Admission: EM | Admit: 2023-04-05 | Discharge: 2023-04-05 | Disposition: A | Discharge status: Home / Self Care | Payer: BC Managed Care – PPO | Attending: Emergency Medicine | Admitting: Emergency Medicine

## 2023-04-05 ENCOUNTER — Other Ambulatory Visit: Payer: Self-pay

## 2023-04-05 ENCOUNTER — Emergency Department (HOSPITAL_COMMUNITY): Payer: BC Managed Care – PPO

## 2023-04-05 ENCOUNTER — Encounter (HOSPITAL_COMMUNITY): Payer: Self-pay

## 2023-04-05 DIAGNOSIS — R1013 Epigastric pain: Secondary | ICD-10-CM | POA: Diagnosis not present

## 2023-04-05 DIAGNOSIS — K2900 Acute gastritis without bleeding: Secondary | ICD-10-CM | POA: Insufficient documentation

## 2023-04-05 DIAGNOSIS — R1010 Upper abdominal pain, unspecified: Secondary | ICD-10-CM | POA: Diagnosis not present

## 2023-04-05 LAB — CBC
HCT: 33.6 % — ABNORMAL LOW (ref 36.0–46.0)
Hemoglobin: 11.2 g/dL — ABNORMAL LOW (ref 12.0–15.0)
MCH: 32.4 pg (ref 26.0–34.0)
MCHC: 33.3 g/dL (ref 30.0–36.0)
MCV: 97.1 fL (ref 80.0–100.0)
Platelets: 306 10*3/uL (ref 150–400)
RBC: 3.46 MIL/uL — ABNORMAL LOW (ref 3.87–5.11)
RDW: 11.4 % — ABNORMAL LOW (ref 11.5–15.5)
WBC: 6.4 10*3/uL (ref 4.0–10.5)
nRBC: 0 % (ref 0.0–0.2)

## 2023-04-05 LAB — COMPREHENSIVE METABOLIC PANEL
ALT: 16 U/L (ref 0–44)
AST: 18 U/L (ref 15–41)
Albumin: 3.7 g/dL (ref 3.5–5.0)
Alkaline Phosphatase: 54 U/L (ref 38–126)
Anion gap: 7 (ref 5–15)
BUN: 12 mg/dL (ref 6–20)
CO2: 28 mmol/L (ref 22–32)
Calcium: 9 mg/dL (ref 8.9–10.3)
Chloride: 101 mmol/L (ref 98–111)
Creatinine, Ser: 0.66 mg/dL (ref 0.44–1.00)
GFR, Estimated: >60 mL/min (ref >60)
Glucose, Bld: 96 mg/dL (ref 70–99)
Potassium: 3.3 mmol/L — ABNORMAL LOW (ref 3.5–5.1)
Sodium: 136 mmol/L (ref 135–145)
Total Bilirubin: 0.4 mg/dL (ref <1.2)
Total Protein: 7.4 g/dL (ref 6.5–8.1)

## 2023-04-05 LAB — HCG, SERUM, QUALITATIVE: Preg, Serum: NEGATIVE

## 2023-04-05 LAB — LIPASE, BLOOD: Lipase: 26 U/L (ref 11–51)

## 2023-04-05 MED ORDER — POTASSIUM CHLORIDE CRYS ER 20 MEQ PO TBCR
40.0000 meq | EXTENDED_RELEASE_TABLET | Freq: Once | ORAL | Status: AC
  Administered 2023-04-05: 40 meq via ORAL
  Filled 2023-04-05: qty 2

## 2023-04-05 MED ORDER — HYDROMORPHONE HCL 1 MG/ML IJ SOLN
1.0000 mg | Freq: Once | INTRAMUSCULAR | Status: AC
  Administered 2023-04-05: 1 mg via INTRAVENOUS
  Filled 2023-04-05: qty 1

## 2023-04-05 MED ORDER — FAMOTIDINE 20 MG PO TABS: 20.0000 mg | ORAL_TABLET | Freq: Two times a day (BID) | ORAL | 0 refills | Status: AC

## 2023-04-05 MED ORDER — FAMOTIDINE IN NACL 20-0.9 MG/50ML-% IV SOLN
20.0000 mg | Freq: Once | INTRAVENOUS | Status: AC
  Administered 2023-04-05: 20 mg via INTRAVENOUS
  Filled 2023-04-05: qty 50

## 2023-04-05 MED ORDER — PANTOPRAZOLE SODIUM 40 MG PO TBEC: 40.0000 mg | DELAYED_RELEASE_TABLET | Freq: Two times a day (BID) | ORAL | 0 refills | Status: AC

## 2023-04-05 MED ORDER — PANTOPRAZOLE SODIUM 40 MG PO TBEC
40.0000 mg | DELAYED_RELEASE_TABLET | Freq: Once | ORAL | Status: AC
Start: 2023-04-05 — End: 2023-04-05
  Administered 2023-04-05: 40 mg via ORAL
  Filled 2023-04-05: qty 1

## 2023-04-05 MED ORDER — SUCRALFATE 1 G PO TABS: 1.0000 g | ORAL_TABLET | Freq: Three times a day (TID) | ORAL | 0 refills | Status: AC

## 2023-04-05 MED ORDER — ALUM & MAG HYDROXIDE-SIMETH 200-200-20 MG/5ML PO SUSP
30.0000 mL | Freq: Once | ORAL | Status: AC
  Administered 2023-04-05: 30 mL via ORAL
  Filled 2023-04-05: qty 30

## 2023-04-05 NOTE — Discharge Instructions (Addendum)
You appear to have gastritis causing your symptoms.  We are treating you for with multiple different medications to help with this.  Be sure to follow-up closely with your primary care physician.  Avoid NSAIDs such as ibuprofen, Advil, Aleve, etc.  You may still take Tylenol.  Avoid alcohol and spicy foods for now.  If you develop worsening, continued, or recurrent abdominal pain, uncontrolled vomiting, fever, chest or back pain, or any other new/concerning symptoms then return to the ER for evaluation.

## 2023-04-05 NOTE — ED Provider Notes (Signed)
[place] [facility] AT [facility] Provider Note   CSN: [id number] Arrival date & time: 04/05/23  4403     History  Chief Complaint  Patient presents with   Abdominal Pain   Emesis   Diarrhea    Shirley Nunez is a 47 y.o. female.  HPI 47 year old female presents with about 3-4 days of upper abdominal pain.  The pain radiates to her back.  She has had 1 episode of vomiting as well as multiple episodes of nonbloody diarrhea.  The patient's pain seems to be gone when she first wakes up but then after eating it will occur after about 20 minutes.  It varies between dull and sharp and aching.  It makes her want to not eat and so she has been eating less.  She did not have any hematemesis.  No chest pain, shortness of breath.  About a year or so ago she had a similar presentation and went to the ER where they did not find thing specific and that she was diagnosed with H. pylori by her PCP.  Home Medications Prior to Admission medications   Medication Sig Start Date End Date Taking? Authorizing Provider  famotidine (PEPCID) 20 MG tablet Take 1 tablet (20 mg total) by mouth 2 (two) times daily. 04/05/23  Yes Pricilla Loveless, MD  pantoprazole (PROTONIX) 40 MG tablet Take 1 tablet (40 mg total) by mouth 2 (two) times daily before a meal for 14 days. 04/05/23 04/19/23 Yes Pricilla Loveless, MD  sucralfate (CARAFATE) 1 g tablet Take 1 tablet (1 g total) by mouth 4 (four) times daily -  with meals and at bedtime. 04/05/23  Yes Pricilla Loveless, MD  escitalopram (LEXAPRO) 10 MG tablet Take 1 tablet (10 mg total) by mouth daily. Patient not taking: Reported on 03/31/2023 03/30/23 05/29/23  Princess Bruins, DO  hydrochlorothiazide (HYDRODIURIL) 25 MG tablet Take 1 tablet (25 mg total) by mouth daily as needed. 06/11/22   Jarold Motto, PA  lisinopril (ZESTRIL) 10 MG tablet Take 1 tablet (10 mg total) by mouth daily. Patient not taking: Reported on 03/31/2023 12/04/22   Jarold Motto, PA  traZODone (DESYREL) 50 MG tablet Take 1 tablet (50 mg total) by mouth at bedtime as needed for sleep. Patient not taking: Reported on 03/31/2023 03/30/23 05/29/23  Princess Bruins, DO  valACYclovir (VALTREX) 500 MG tablet Take 1 tablet (500 mg total) by mouth daily. 06/11/22   Jarold Motto, PA      Allergies    Mushroom extract complex and Penicillins    Review of Systems   Review of Systems  Respiratory:  Negative for shortness of breath.   Cardiovascular:  Negative for chest pain.  Gastrointestinal:  Positive for abdominal pain, diarrhea, nausea and vomiting. Negative for blood in stool.  Genitourinary:  Negative for dysuria.  Musculoskeletal:  Positive for back pain.    Physical Exam Updated Vital Signs BP (!) 149/98   Pulse 73   Temp 97.9 F (36.6 C) (Oral)   Resp 14   Ht 5\' 6"  (1.676 m)   Wt 74.6 kg   SpO2 97%   BMI 26.55 kg/m  Physical Exam Vitals and nursing note reviewed.  Constitutional:      Appearance: She is well-developed.  HENT:     Head: Normocephalic and atraumatic.  Cardiovascular:     Rate and Rhythm: Normal rate and regular rhythm.     Heart sounds: Normal heart sounds.  Pulmonary:     Effort: Pulmonary effort  is normal.     Breath sounds: Normal breath sounds.  Abdominal:     Palpations: Abdomen is soft.     Tenderness: There is abdominal tenderness (worst in epigastrum) in the right upper quadrant, epigastric area and left upper quadrant.  Skin:    General: Skin is warm and dry.  Neurological:     Mental Status: She is alert.     ED Results / Procedures / Treatments   Labs (all labs ordered are listed, but only abnormal results are displayed) Labs Reviewed  COMPREHENSIVE METABOLIC PANEL - Abnormal; Notable for the following components:      Result Value   Potassium 3.3 (*)    All other components within normal limits  CBC - Abnormal; Notable for the following components:   RBC 3.46 (*)    Hemoglobin 11.2 (*)    HCT 33.6  (*)    RDW 11.4 (*)    All other components within normal limits  LIPASE, BLOOD  HCG, SERUM, QUALITATIVE  URINALYSIS, ROUTINE W REFLEX MICROSCOPIC    EKG EKG Interpretation Date/Time:  Sunday April 05 2023 19:51:56 EST Ventricular Rate:  81 PR Interval:  212 QRS Duration:  101 QT Interval:  400 QTC Calculation: 465 R Axis:   55  Text Interpretation: Sinus rhythm Prolonged PR interval Low voltage, precordial leads similar to July 2023 Confirmed by Pricilla Loveless [phone]) on 04/05/2023 8:29:39 PM  Radiology US Abdomen Limited RUQ (LIVER/GB)  Result Date: 04/05/2023 CLINICAL DATA:  Upper abdominal pain. EXAM: ULTRASOUND ABDOMEN LIMITED RIGHT UPPER QUADRANT COMPARISON:  11/14/2021. FINDINGS: Gallbladder: No gallstones or wall thickening visualized. No sonographic Murphy sign noted by sonographer. Common bile duct: Diameter: 5 mm Liver: No focal lesion identified. Within normal limits in parenchymal echogenicity. Portal vein is patent on color Doppler imaging with normal direction of blood flow towards the liver. Other: No free fluid. IMPRESSION: No cholelithiasis or acute cholecystitis. Electronically Signed   By: Thornell Sartorius M.D.   On: 04/05/2023 21:12    Procedures Procedures    Medications Ordered in ED Medications  HYDROmorphone (DILAUDID) injection 1 mg (1 mg Intravenous Given 04/05/23 2050)  famotidine (PEPCID) IVPB 20 mg premix (0 mg Intravenous Stopped 04/05/23 2126)  potassium chloride SA (KLOR-CON M) CR tablet 40 mEq (40 mEq Oral Given 04/05/23 2136)  alum & mag hydroxide-simeth (MAALOX/MYLANTA) 200-200-20 MG/5ML suspension 30 mL (30 mLs Oral Given 04/05/23 2221)  pantoprazole (PROTONIX) EC tablet 40 mg (40 mg Oral Given 04/05/23 2221)    ED Course/ Medical Decision Making/ A&P                                 Medical Decision Making Amount and/or Complexity of Data Reviewed Labs: ordered.    Details: Mild hypokalemia.  Normal WBC. Radiology: ordered and  independent interpretation performed.    Details: No cholecystitis ECG/medicine tests: ordered and independent interpretation performed.    Details: No ischemia  Risk OTC drugs. Prescription drug management.   Patient's pain is primarily epigastric and probably gastritis.  She states she was diagnosed with H. pylori in the past though is unclear if she has this again versus more generic gastritis.  I think she needs a follow-up with her PCP and she already has a GI appointment.  She is feeling better here and with a negative ultrasound and unremarkable labs compared to baseline, I do not think she needs further workup.  I have a  low suspicion that she has an intra-abdominal emergency.  Highly doubt perforation or bleeding ulcer.  She is feeling better and we will give her treatments for gastritis and have her avoid NSAIDs, alcohol, etc.  Will give return precautions and have her follow-up with PCP.        Final Clinical Impression(s) / ED Diagnoses Final diagnoses:  Acute gastritis without hemorrhage, unspecified gastritis type    Rx / DC Orders ED Discharge Orders          Ordered    pantoprazole (PROTONIX) 40 MG tablet  2 times daily before meals        04/05/23 2146    famotidine (PEPCID) 20 MG tablet  2 times daily        04/05/23 2146    sucralfate (CARAFATE) 1 g tablet  3 times daily with meals & bedtime        04/05/23 2146              Pricilla Loveless, MD 04/05/23 2230

## 2023-04-05 NOTE — ED Triage Notes (Signed)
Pt c/o epigastric pain, N/V/D, and lower back painx3d.

## 2023-04-07 ENCOUNTER — Ambulatory Visit (INDEPENDENT_AMBULATORY_CARE_PROVIDER_SITE_OTHER): Payer: BC Managed Care – PPO | Admitting: Physician Assistant

## 2023-04-07 VITALS — BP 122/80 | HR 64 | Temp 98.0°F | Ht 66.0 in | Wt 166.4 lb

## 2023-04-07 DIAGNOSIS — R1013 Epigastric pain: Secondary | ICD-10-CM

## 2023-04-07 NOTE — Progress Notes (Signed)
Shirley Nunez is a 47 y.o. female here for a follow up of a pre-existing problem.  History of Present Illness:   Chief Complaint  Patient presents with   Follow-up    Pt c/o epigastric pain, wants to be tested for H-pylori.    HPI  Abdominal pain She went to the ER on 04/05/23 for upper abdominal pain -- dull and aching about 20 minutes after eating. She was given dilaudid, IV Pepcid, potassium tablet, Maalox and oral protonix while in the ER. She was told to follow-up with [place] and to see gastroenterology. She was discharged home with protonix 40 mg twice daily, Pepcid 20 mg twice daily, and carafate 1 gram qid.  Has upcoming appointment with gastroenterology on 06/03/23. History of H Pylori in July 2023.  Symptom(s) started close to around when she had the Lexapro so she initially attributed her poor appetite to that. When she eats she gets significant bloating after she eats. Has had some diarrhea as well with the initiation of lexapro  No burning with urination, vaginal discharge, hematemesis, hematochezia.  Past Medical History:  Diagnosis Date   Anxiety    Depression    HSV infection    on daily medication   Migraine    Sickle cell anemia (HCC)    Sickle cell trait     Social History   Tobacco Use   Smoking status: Never   Smokeless tobacco: Never  Vaping Use   Vaping status: Never Used  Substance Use Topics   Alcohol use: Yes    Alcohol/week: 2.0 standard drinks of alcohol    Types: 2 Glasses of wine per week    Comment: 2 x / month   Drug use: No    Past Surgical History:  Procedure Laterality Date   ENDOMETRIAL ABLATION     WISDOM TOOTH EXTRACTION      Family History  Problem Relation Age of Onset   Hypertension Mother    Depression Mother    Miscarriages / Stillbirths Mother    Hypertension Father    Hyperlipidemia Father    Diabetes Father    Arthritis Father    Stroke Father    Sickle cell anemia Sister    Alcohol abuse Maternal  Grandmother    Arthritis Maternal Grandmother    Depression Maternal Grandmother    Diabetes Maternal Grandmother    Heart attack Maternal Grandmother    Hypertension Maternal Grandmother    Stroke Maternal Grandmother    Heart attack Maternal Grandfather    Lung cancer Paternal Grandmother        mets to brain   Alcohol abuse Paternal Grandfather    Heart attack Paternal Grandfather    Hypertension Paternal Grandfather    Stroke Paternal Grandfather    Colon cancer Neg Hx     Allergies  Allergen Reactions   Mushroom Extract Complex Rash   Penicillins Rash    Current Medications:   Current Outpatient Medications:    escitalopram (LEXAPRO) 10 MG tablet, Take 1 tablet (10 mg total) by mouth daily., Disp: 30 tablet, Rfl: 1   famotidine (PEPCID) 20 MG tablet, Take 1 tablet (20 mg total) by mouth 2 (two) times daily., Disp: 30 tablet, Rfl: 0   hydrochlorothiazide (HYDRODIURIL) 25 MG tablet, Take 1 tablet (25 mg total) by mouth daily as needed., Disp: 90 tablet, Rfl: 3   lisinopril (ZESTRIL) 10 MG tablet, Take 1 tablet (10 mg total) by mouth daily., Disp: 90 tablet, Rfl: 1   pantoprazole (  PROTONIX) 40 MG tablet, Take 1 tablet (40 mg total) by mouth 2 (two) times daily before a meal for 14 days., Disp: 28 tablet, Rfl: 0   traZODone (DESYREL) 50 MG tablet, Take 1 tablet (50 mg total) by mouth at bedtime as needed for sleep., Disp: 60 tablet, Rfl: 0   valACYclovir (VALTREX) 500 MG tablet, Take 1 tablet (500 mg total) by mouth daily., Disp: 90 tablet, Rfl: 2   sucralfate (CARAFATE) 1 g tablet, Take 1 tablet (1 g total) by mouth 4 (four) times daily -  with meals and at bedtime. (Patient not taking: Reported on 04/07/2023), Disp: 120 tablet, Rfl: 0   Review of Systems:   ROS Negative unless otherwise specified per HPI.  Vitals:   Vitals:   04/07/23 0907  BP: 122/80  Pulse: 64  Temp: 98 F (36.7 C)  TempSrc: Temporal  SpO2: 100%  Weight: 166 lb 6.1 oz (75.5 kg)  Height: 5\' 6"   (1.676 m)     Body mass index is 26.85 kg/m.  Physical Exam:   Physical Exam Vitals and nursing note reviewed.  Constitutional:      General: She is not in acute distress.    Appearance: She is well-developed. She is not ill-appearing or toxic-appearing.  Cardiovascular:     Rate and Rhythm: Normal rate and regular rhythm.     Pulses: Normal pulses.     Heart sounds: Normal heart sounds, S1 normal and S2 normal.  Pulmonary:     Effort: Pulmonary effort is normal.     Breath sounds: Normal breath sounds.  Abdominal:     General: Abdomen is flat. Bowel sounds are normal.     Palpations: Abdomen is soft.     Tenderness: There is abdominal tenderness in the epigastric area. There is no right CVA tenderness, left CVA tenderness, guarding or rebound.  Skin:    General: Skin is warm and dry.  Neurological:     Mental Status: She is alert.     GCS: GCS eye subscore is 4. GCS verbal subscore is 5. GCS motor subscore is 6.  Psychiatric:        Speech: Speech normal.        Behavior: Behavior normal. Behavior is cooperative.     Assessment and Plan:   Epigastric pain No red flags or evidence of acute abdomen Will order H Pylori testing Continue medications prescribed by ER in the meantime to treat for gastritis; work on low acid food intake and small, frequent meals of bland foods If new/worsening symptom(s), recommend return to ER She has requested that I change the status of her gastroenterology referral to urgent      Jarold Motto, PA-C

## 2023-04-13 LAB — H. PYLORI BREATH TEST: H. pylori Breath Test: NOT DETECTED

## 2023-04-21 ENCOUNTER — Encounter: Payer: Self-pay | Admitting: Gastroenterology

## 2023-04-21 ENCOUNTER — Ambulatory Visit (INDEPENDENT_AMBULATORY_CARE_PROVIDER_SITE_OTHER): Payer: BC Managed Care – PPO | Admitting: Gastroenterology

## 2023-04-21 VITALS — BP 138/84 | Ht 65.0 in | Wt 161.2 lb

## 2023-04-21 DIAGNOSIS — R1010 Upper abdominal pain, unspecified: Secondary | ICD-10-CM | POA: Diagnosis not present

## 2023-04-21 DIAGNOSIS — R1030 Lower abdominal pain, unspecified: Secondary | ICD-10-CM | POA: Diagnosis not present

## 2023-04-21 DIAGNOSIS — R630 Anorexia: Secondary | ICD-10-CM

## 2023-04-21 DIAGNOSIS — R6881 Early satiety: Secondary | ICD-10-CM

## 2023-04-21 DIAGNOSIS — R197 Diarrhea, unspecified: Secondary | ICD-10-CM | POA: Diagnosis not present

## 2023-04-21 DIAGNOSIS — Z8619 Personal history of other infectious and parasitic diseases: Secondary | ICD-10-CM

## 2023-04-21 DIAGNOSIS — R140 Abdominal distension (gaseous): Secondary | ICD-10-CM

## 2023-04-21 DIAGNOSIS — R110 Nausea: Secondary | ICD-10-CM

## 2023-04-21 DIAGNOSIS — R1013 Epigastric pain: Secondary | ICD-10-CM | POA: Insufficient documentation

## 2023-04-21 NOTE — Progress Notes (Unsigned)
     04/21/2023 Bessy Snoddy Fuhs [id number] 06/03/1975   HISTORY OF PRESENT ILLNESS:    Past Medical History:  Diagnosis Date   Anxiety    Depression    HSV infection    on daily medication   Migraine    Sickle cell anemia (HCC)    Sickle cell trait   Past Surgical History:  Procedure Laterality Date   ENDOMETRIAL ABLATION     WISDOM TOOTH EXTRACTION      reports that she has never smoked. She has never used smokeless tobacco. She reports current alcohol use of about 2.0 standard drinks of alcohol per week. She reports that she does not use drugs. family history includes Alcohol abuse in her maternal grandmother and paternal grandfather; Arthritis in her father and maternal grandmother; Depression in her maternal grandmother and mother; Diabetes in her father and maternal grandmother; Heart attack in her maternal grandfather, maternal grandmother, and paternal grandfather; Hyperlipidemia in her father; Hypertension in her father, maternal grandmother, mother, and paternal grandfather; Lung cancer in her paternal grandmother; Miscarriages / Stillbirths in her mother; Sickle cell anemia in her sister; Stroke in her father, maternal grandmother, and paternal grandfather. Allergies  Allergen Reactions   Mushroom Extract Complex (Do Not Select) Rash   Penicillins Rash      Outpatient Encounter Medications as of 04/21/2023  Medication Sig   escitalopram (LEXAPRO) 10 MG tablet Take 1 tablet (10 mg total) by mouth daily.   famotidine (PEPCID) 20 MG tablet Take 1 tablet (20 mg total) by mouth 2 (two) times daily.   hydrochlorothiazide (HYDRODIURIL) 25 MG tablet Take 1 tablet (25 mg total) by mouth daily as needed.   lisinopril (ZESTRIL) 10 MG tablet Take 1 tablet (10 mg total) by mouth daily.   sucralfate (CARAFATE) 1 g tablet Take 1 tablet (1 g total) by mouth 4 (four) times daily -  with meals and at bedtime.   traZODone (DESYREL) 50 MG tablet Take 1 tablet (50 mg total) by mouth at  bedtime as needed for sleep.   valACYclovir (VALTREX) 500 MG tablet Take 1 tablet (500 mg total) by mouth daily.   pantoprazole (PROTONIX) 40 MG tablet Take 1 tablet (40 mg total) by mouth 2 (two) times daily before a meal for 14 days.   No facility-administered encounter medications on file as of 04/21/2023.    REVIEW OF SYSTEMS  : All other systems reviewed and negative except where noted in the History of Present Illness.   PHYSICAL EXAM: BP 138/84   Ht 5\' 5"  (1.651 m)   Wt 161 lb 3.2 oz (73.1 kg)   BMI 26.83 kg/m  General: Well developed female in no acute distress Head: Normocephalic and atraumatic Eyes:  Sclerae anicteric, conjunctiva pink. Ears: Normal auditory acuity Lungs: Clear throughout to auscultation; no W/R/R. Heart: Regular rate and rhythm; no M/R/G. Abdomen: Soft, non-distended.  BS present.  Diffuse TTP. Rectal:  Will be done at the time of colonoscopy. Musculoskeletal: Symmetrical with no gross deformities  Skin: No lesions on visible extremities Extremities: No edema  Neurological: Alert oriented x 4, grossly non-focal Psychological:  Alert and cooperative. Normal mood and affect  ASSESSMENT AND PLAN:    CC:  Jarold Motto, PA

## 2023-04-21 NOTE — Patient Instructions (Signed)
You have been scheduled for an endoscopy and colonoscopy. Please follow the written instructions given to you at your visit today.  Please pick up your prep supplies at the pharmacy within the next 1-3 days.  If you use inhalers (even only as needed), please bring them with you on the day of your procedure.  DO NOT TAKE 7 DAYS PRIOR TO TEST- Trulicity (dulaglutide) Ozempic, Wegovy (semaglutide) Mounjaro (tirzepatide) Bydureon Bcise (exanatide extended release)  DO NOT TAKE 1 DAY PRIOR TO YOUR TEST Rybelsus (semaglutide) Adlyxin (lixisenatide) Victoza (liraglutide) Byetta (exanatide) ________________________________________________________________  _______________________________________________________  If your blood pressure at your visit was 140/90 or greater, please contact your primary care physician to follow up on this.  _______________________________________________________  If you are age 71 or older, your body mass index should be between 23-30. Your Body mass index is 26.83 kg/m. If this is out of the aforementioned range listed, please consider follow up with your Primary Care Provider.  If you are age 20 or younger, your body mass index should be between 19-25. Your Body mass index is 26.83 kg/m. If this is out of the aformentioned range listed, please consider follow up with your Primary Care Provider.   ________________________________________________________  The [facility] providers would like to encourage you to use [facility] to communicate with providers for non-urgent requests or questions.  Due to long hold times on the telephone, sending your provider a message by [facility] may be a faster and more efficient way to get a response.  Please allow 48 business hours for a response.  Please remember that this is for non-urgent requests.  _______________________________________________________

## 2023-04-24 ENCOUNTER — Ambulatory Visit (AMBULATORY_SURGERY_CENTER): Payer: BC Managed Care – PPO | Admitting: Gastroenterology

## 2023-04-24 ENCOUNTER — Encounter: Payer: Self-pay | Admitting: Gastroenterology

## 2023-04-24 VITALS — BP 161/85 | HR 74 | Temp 97.2°F | Resp 9 | Ht 65.0 in | Wt 161.0 lb

## 2023-04-24 DIAGNOSIS — R1013 Epigastric pain: Secondary | ICD-10-CM

## 2023-04-24 DIAGNOSIS — R140 Abdominal distension (gaseous): Secondary | ICD-10-CM

## 2023-04-24 DIAGNOSIS — R197 Diarrhea, unspecified: Secondary | ICD-10-CM | POA: Diagnosis not present

## 2023-04-24 DIAGNOSIS — K6389 Other specified diseases of intestine: Secondary | ICD-10-CM

## 2023-04-24 DIAGNOSIS — R110 Nausea: Secondary | ICD-10-CM

## 2023-04-24 DIAGNOSIS — R194 Change in bowel habit: Secondary | ICD-10-CM

## 2023-04-24 DIAGNOSIS — K2950 Unspecified chronic gastritis without bleeding: Secondary | ICD-10-CM | POA: Diagnosis not present

## 2023-04-24 MED ORDER — SODIUM CHLORIDE 0.9 % IV SOLN: 500.0000 mL | Freq: Once | INTRAVENOUS | Status: DC

## 2023-04-24 NOTE — Progress Notes (Signed)
Agree with the assessment and plan as outlined by Jessica Zehr, PA-C. ? ?Omesha Bowerman, DO, FACG ? ?

## 2023-04-24 NOTE — Patient Instructions (Addendum)
Await pathology results.  Resume previous diet. Continue present medications.  Repeat colonoscopy in 10 years for screening purposes. Use fiber, for example Citrucel, Fibercon, Konsyl, or metamucil.   YOU HAD AN ENDOSCOPIC PROCEDURE TODAY AT THE [facility]:   Refer to the procedure report that was given to you for any specific questions about what was found during the examination.  If the procedure report does not answer your questions, please call your gastroenterologist to clarify.  If you requested that your care partner not be given the details of your procedure findings, then the procedure report has been included in a sealed envelope for you to review at your convenience later.  YOU SHOULD EXPECT: Some feelings of bloating in the abdomen. Passage of more gas than usual.  Walking can help get rid of the air that was put into your GI tract during the procedure and reduce the bloating. If you had a lower endoscopy (such as a colonoscopy or flexible sigmoidoscopy) you may notice spotting of blood in your stool or on the toilet paper. If you underwent a bowel prep for your procedure, you may not have a normal bowel movement for a few days.  Please Note:  You might notice some irritation and congestion in your nose or some drainage.  This is from the oxygen used during your procedure.  There is no need for concern and it should clear up in a day or so.  SYMPTOMS TO REPORT IMMEDIATELY:  Following lower endoscopy (colonoscopy or flexible sigmoidoscopy):  Excessive amounts of blood in the stool  Significant tenderness or worsening of abdominal pains  Swelling of the abdomen that is new, acute  Fever of 100F or higher  Following upper endoscopy (EGD)  Vomiting of blood or coffee ground material  New chest pain or pain under the shoulder blades  Painful or persistently difficult swallowing  New shortness of breath  Fever of 100F or higher  Black, tarry-looking stools  For  urgent or emergent issues, a gastroenterologist can be reached at any hour by calling [phone]. Do not use MyChart messaging for urgent concerns.    DIET:  We do recommend a small meal at first, but then you may proceed to your regular diet.  Drink plenty of fluids but you should avoid alcoholic beverages for 24 hours.  ACTIVITY:  You should plan to take it easy for the rest of today and you should NOT DRIVE or use heavy machinery until tomorrow (because of the sedation medicines used during the test).    FOLLOW UP: Our staff will call the number listed on your records the next business day following your procedure.  We will call around 7:15- 8:00 am to check on you and address any questions or concerns that you may have regarding the information given to you following your procedure. If we do not reach you, we will leave a message.     If any biopsies were taken you will be contacted by phone or by letter within the next 1-3 weeks.  Please call us at [phone] if you have not heard about the biopsies in 3 weeks.    SIGNATURES/CONFIDENTIALITY: You and/or your care partner have signed paperwork which will be entered into your electronic medical record.  These signatures attest to the fact that that the information above on your After Visit Summary has been reviewed and is understood.  Full responsibility of the confidentiality of this discharge information lies with you and/or your care-partner.

## 2023-04-24 NOTE — Op Note (Signed)
[facility] Patient Name: Shirley Nunez Procedure Date: 04/24/2023 2:00 PM MRN: [id number] Endoscopist: Doristine Locks , MD, [id number] Age: 47 Referring MD:  Date of Birth: 1976-04-10 Gender: Female Account #: [id number] Procedure:                Colonoscopy Indications:              Epigastric abdominal pain, Change in bowel habits,                            Diarrhea, Bloating Medicines:                Monitored Anesthesia Care Procedure:                Pre-Anesthesia Assessment:                           - Prior to the procedure, a History and Physical                            was performed, and patient medications and                            allergies were reviewed. The patient's tolerance of                            previous anesthesia was also reviewed. The risks                            and benefits of the procedure and the sedation                            options and risks were discussed with the patient.                            All questions were answered, and informed consent                            was obtained. Prior Anticoagulants: The patient has                            taken no anticoagulant or antiplatelet agents. ASA                            Grade Assessment: II - A patient with mild systemic                            disease. After reviewing the risks and benefits,                            the patient was deemed in satisfactory condition to                            undergo the procedure.  After obtaining informed consent, the colonoscope                            was passed under direct vision. Throughout the                            procedure, the patient's blood pressure, pulse, and                            oxygen saturations were monitored continuously. The                            CF [id number] #[id number] was introduced through the anus                            and advanced to the the terminal  ileum. The                            colonoscopy was performed without difficulty. The                            patient tolerated the procedure well. The quality                            of the bowel preparation was good. The terminal                            ileum, ileocecal valve, appendiceal orifice, and                            rectum were photographed. Scope In: 2:19:47 PM Scope Out: 2:35:21 PM Scope Withdrawal Time: 0 hours 9 minutes 43 seconds  Total Procedure Duration: 0 hours 15 minutes 34 seconds  Findings:                 The perianal and digital rectal examinations were                            normal.                           The entire colon appeared normal. Biopsies for                            histology were taken with a cold forceps from the                            right colon and left colon for evaluation of                            microscopic colitis. Estimated blood loss was                            minimal.  The retroflexed view of the distal rectum and anal                            verge was normal and showed no anal or rectal                            abnormalities.                           The terminal ileum appeared normal. Complications:            No immediate complications. Estimated Blood Loss:     Estimated blood loss was minimal. Impression:               - The entire examined colon is normal. Biopsied.                           - The distal rectum and anal verge are normal on                            retroflexion view.                           - The examined portion of the ileum was normal. Recommendation:           - Patient has a contact number available for                            emergencies. The signs and symptoms of potential                            delayed complications were discussed with the                            patient. Return to normal activities tomorrow.                             Written discharge instructions were provided to the                            patient.                           - Resume previous diet.                           - Continue present medications.                           - Await pathology results.                           - Repeat colonoscopy in 10 years for screening                            purposes.                           -  Return to GI office PRN.                           - Use fiber, for example Citrucel, Fibercon, Konsyl                            or Metamucil. Doristine Locks, MD 04/24/2023 2:50:16 PM

## 2023-04-24 NOTE — Progress Notes (Signed)
Called to room to assist during endoscopic procedure.  Patient ID and intended procedure confirmed with present staff. Received instructions for my participation in the procedure from the performing physician.  

## 2023-04-24 NOTE — Progress Notes (Signed)
Vss nad trans to pacu 

## 2023-04-24 NOTE — Progress Notes (Signed)
GASTROENTEROLOGY PROCEDURE H&P NOTE   Primary Care Physician: Jarold Motto, PA    Reason for Procedure:   Abdominal pain, bloating, diarrhea, change in stools, nausea, decreased appetite, hx of H pylori, dyspepsia  Plan:    EGD, colonoscopy  Patient is appropriate for endoscopic procedure(s) in the ambulatory ([facility]) setting.  The nature of the procedure, as well as the risks, benefits, and alternatives were carefully and thoroughly reviewed with the patient. Ample time for discussion and questions allowed. The patient understood, was satisfied, and agreed to proceed.     HPI: Shirley Nunez is a 47 y.o. female who presents for EGD and colonoscopy for evaluation of multiple GI sxs, to include abdominal pain, dyspepsia, nausea, bloating, diarrhea, change in stools, decreased appetite, and hx of H pylori.  Patient was most recently seen in the Gastroenterology Clinic on 04/21/2023 by Doug Sou, PA-C.  No interval change in medical history since that appointment. Please refer to that note for full details regarding GI history and clinical presentation.   Past Medical History:  Diagnosis Date   Anxiety    Depression    HSV infection    on daily medication   Hypertension    Migraine    Sickle cell anemia (HCC)    Sickle cell trait    Past Surgical History:  Procedure Laterality Date   ENDOMETRIAL ABLATION     WISDOM TOOTH EXTRACTION      Prior to Admission medications   Medication Sig Start Date End Date Taking? Authorizing Provider  escitalopram (LEXAPRO) 10 MG tablet Take 1 tablet (10 mg total) by mouth daily. 03/30/23 05/29/23 Yes Princess Bruins, DO  hydrochlorothiazide (HYDRODIURIL) 25 MG tablet Take 1 tablet (25 mg total) by mouth daily as needed. 06/11/22  Yes Jarold Motto, PA  lisinopril (ZESTRIL) 10 MG tablet Take 1 tablet (10 mg total) by mouth daily. 12/04/22  Yes Jarold Motto, PA  traZODone (DESYREL) 50 MG tablet Take 1 tablet (50 mg total) by mouth at  bedtime as needed for sleep. 03/30/23 05/29/23 Yes Princess Bruins, DO  valACYclovir (VALTREX) 500 MG tablet Take 1 tablet (500 mg total) by mouth daily. 06/11/22  Yes Jarold Motto, PA  famotidine (PEPCID) 20 MG tablet Take 1 tablet (20 mg total) by mouth 2 (two) times daily. 04/05/23   Pricilla Loveless, MD  pantoprazole (PROTONIX) 40 MG tablet Take 1 tablet (40 mg total) by mouth 2 (two) times daily before a meal for 14 days. 04/05/23 04/19/23  Pricilla Loveless, MD  sucralfate (CARAFATE) 1 g tablet Take 1 tablet (1 g total) by mouth 4 (four) times daily -  with meals and at bedtime. 04/05/23   Pricilla Loveless, MD    Current Outpatient Medications  Medication Sig Dispense Refill   escitalopram (LEXAPRO) 10 MG tablet Take 1 tablet (10 mg total) by mouth daily. 30 tablet 1   hydrochlorothiazide (HYDRODIURIL) 25 MG tablet Take 1 tablet (25 mg total) by mouth daily as needed. 90 tablet 3   lisinopril (ZESTRIL) 10 MG tablet Take 1 tablet (10 mg total) by mouth daily. 90 tablet 1   traZODone (DESYREL) 50 MG tablet Take 1 tablet (50 mg total) by mouth at bedtime as needed for sleep. 60 tablet 0   valACYclovir (VALTREX) 500 MG tablet Take 1 tablet (500 mg total) by mouth daily. 90 tablet 2   famotidine (PEPCID) 20 MG tablet Take 1 tablet (20 mg total) by mouth 2 (two) times daily. 30 tablet 0   pantoprazole (PROTONIX)  40 MG tablet Take 1 tablet (40 mg total) by mouth 2 (two) times daily before a meal for 14 days. 28 tablet 0   sucralfate (CARAFATE) 1 g tablet Take 1 tablet (1 g total) by mouth 4 (four) times daily -  with meals and at bedtime. 120 tablet 0   Current Facility-Administered Medications  Medication Dose Route Frequency Provider Last Rate Last Admin   0.9 %  sodium chloride infusion  500 mL Intravenous Once Hava Massingale V, DO        Allergies as of 04/24/2023 - Review Complete 04/24/2023  Allergen Reaction Noted   Mushroom extract complex (do not select) Rash 07/16/2017   Penicillins  Rash 07/12/2014    Family History  Problem Relation Age of Onset   Hypertension Mother    Depression Mother    Miscarriages / [place] Mother    Hypertension Father    Hyperlipidemia Father    Diabetes Father    Arthritis Father    Stroke Father    Sickle cell anemia Sister    Alcohol abuse Maternal Grandmother    Arthritis Maternal Grandmother    Depression Maternal Grandmother    Diabetes Maternal Grandmother    Heart attack Maternal Grandmother    Hypertension Maternal Grandmother    Stroke Maternal Grandmother    Heart attack Maternal Grandfather    Lung cancer Paternal Grandmother        mets to brain   Alcohol abuse Paternal Grandfather    Heart attack Paternal Grandfather    Hypertension Paternal Grandfather    Stroke Paternal Grandfather    Colon cancer Neg Hx    Colon polyps Neg Hx    Esophageal cancer Neg Hx    Rectal cancer Neg Hx    Stomach cancer Neg Hx     Social History   Socioeconomic History   Marital status: Single    Spouse name: Not on file   Number of children: 2   Years of education: Not on file   Highest education level: Associate degree: occupational, Scientist, product/process development, or vocational program  Occupational History   Not on file  Tobacco Use   Smoking status: Never   Smokeless tobacco: Never  Vaping Use   Vaping status: Never Used  Substance and Sexual Activity   Alcohol use: Yes    Alcohol/week: 2.0 standard drinks of alcohol    Types: 2 Glasses of wine per week    Comment: 2 x / month   Drug use: No   Sexual activity: Yes    Birth control/protection: Other-see comments, None    Comment: ablation  Other Topics Concern   Not on file  Social History Narrative   Sales promotion account executive at [employer]    Married   2 kids   Likes: writing and reading   One of her sons passed away 16 years ago   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, 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   Social Drivers of Health   Financial Resource Strain: Medium Risk  (04/07/2023)   Overall Financial Resource Strain (CARDIA)    Difficulty of Paying Living Expenses: Somewhat hard  Food Insecurity: Food Insecurity Present (04/07/2023)   Hunger Vital Sign    Worried About Running Out of Food in the Last Year: Sometimes true    Ran Out of Food in the Last Year: Sometimes true  Transportation Needs: No Transportation Needs (04/07/2023)   PRAPARE - Administrator, Civil Service (Medical): No  Lack of Transportation (Non-Medical): No  Physical Activity: Unknown (04/07/2023)   Exercise Vital Sign    Days of Exercise per Week: 1 day    Minutes of Exercise per Session: Patient declined  Stress: Stress Concern Present (04/07/2023)   [employer] of Occupational Health - Occupational Stress Questionnaire    Feeling of Stress : Very much  Social Connections: Moderately Isolated (04/07/2023)   Social Connection and Isolation Panel [NHANES]    Frequency of Communication with Friends and Family: Once a week    Frequency of Social Gatherings with Friends and Family: Never    Attends Religious Services: 1 to 4 times per year    Active Member of [employer] or Organizations: Yes    Attends Banker Meetings: 1 to 4 times per year    Marital Status: Divorced  Catering manager Violence: Not on file    Physical Exam: Vital signs in last 24 hours: @BP  (!) 129/100 (BP Location: Left Arm)   Temp (!) 97.2 F (36.2 C)   Ht 5\' 5"  (1.651 m)   Wt 161 lb (73 kg)   SpO2 100%   BMI 26.79 kg/m  GEN: NAD EYE: Sclerae anicteric ENT: MMM CV: Non-tachycardic Pulm: CTA b/l GI: Soft, NT/ND NEURO:  Alert & Oriented x 3   Doristine Locks, DO  Gastroenterology   04/24/2023 2:03 PM

## 2023-04-24 NOTE — Op Note (Signed)
[facility] Patient Name: Shirley Nunez Procedure Date: 04/24/2023 2:00 PM MRN: [id number] Endoscopist: Doristine Locks , MD, [id number] Age: 47 Referring MD:  Date of Birth: 07/26/1975 Gender: Female Account #: [id number] Procedure:                Upper GI endoscopy Indications:              Epigastric abdominal pain, Abdominal bloating,                            Diarrhea, Dyspepsia, Hx of H pylori, Decreased                            appetite Medicines:                Monitored Anesthesia Care Procedure:                Pre-Anesthesia Assessment:                           - Prior to the procedure, a History and Physical                            was performed, and patient medications and                            allergies were reviewed. The patient's tolerance of                            previous anesthesia was also reviewed. The risks                            and benefits of the procedure and the sedation                            options and risks were discussed with the patient.                            All questions were answered, and informed consent                            was obtained. Prior Anticoagulants: The patient has                            taken no anticoagulant or antiplatelet agents. ASA                            Grade Assessment: II - A patient with mild systemic                            disease. After reviewing the risks and benefits,                            the patient was deemed in satisfactory condition to  undergo the procedure.                           After obtaining informed consent, the endoscope was                            passed under direct vision. Throughout the                            procedure, the patient's blood pressure, pulse, and                            oxygen saturations were monitored continuously. The                            Olympus Scope SN [id number] was introduced through  the                            mouth, and advanced to the third part of duodenum.                            The upper GI endoscopy was accomplished without                            difficulty. The patient tolerated the procedure                            well. Scope In: Scope Out: Findings:                 The examined esophagus was normal.                           The Z-line was regular and was found 40 cm from the                            incisors.                           The gastroesophageal flap valve was visualized                            endoscopically and classified as Hill Grade II                            (fold present, opens with respiration).                           Diffuse mild inflammation characterized by                            congestion (edema) was found in the gastric body                            and in the gastric antrum. Biopsies were taken with  a cold forceps for histology and Helicobacter                            pylori testing. Estimated blood loss was minimal.                           The examined duodenum was normal. Biopsies were                            taken with a cold forceps for histology. Estimated                            blood loss was minimal. Complications:            No immediate complications. Estimated Blood Loss:     Estimated blood loss was minimal. Impression:               - Normal esophagus.                           - Z-line regular, 40 cm from the incisors.                           - Gastroesophageal flap valve classified as Hill                            Grade II (fold present, opens with respiration).                           - Gastritis. Biopsied.                           - Normal examined duodenum. Biopsied. Recommendation:           - Patient has a contact number available for                            emergencies. The signs and symptoms of potential                             delayed complications were discussed with the                            patient. Return to normal activities tomorrow.                            Written discharge instructions were provided to the                            patient.                           - Resume previous diet.                           - Continue present medications.                           -  Await pathology results.                           - Colonoscopy today. Doristine Locks, MD 04/24/2023 2:44:51 PM

## 2023-04-27 ENCOUNTER — Telehealth: Payer: Self-pay

## 2023-04-27 NOTE — Telephone Encounter (Signed)
  Follow up Call-     04/24/2023    1:14 PM  Call back number  Post procedure Call Back phone  # [phone]  Permission to leave phone message Yes     Patient questions:  Do you have a fever, pain , or abdominal swelling? No. Pain Score  0 *  Have you tolerated food without any problems? Yes.    Have you been able to return to your normal activities? Yes.    Do you have any questions about your discharge instructions: Diet   No. Medications  No. Follow up visit  No.  Do you have questions or concerns about your Care? No.  Actions: * If pain score is 4 or above: No action needed, pain <4.  Patient complains of scratchy throat. Instructed to gargle with salty and water. Call back if needed. No difficulty swallowing or breathing.

## 2023-04-29 LAB — SURGICAL PATHOLOGY

## 2023-05-05 DIAGNOSIS — Z0189 Encounter for other specified special examinations: Secondary | ICD-10-CM | POA: Diagnosis not present

## 2023-05-18 ENCOUNTER — Encounter (HOSPITAL_COMMUNITY): Payer: Self-pay | Admitting: Student

## 2023-05-18 ENCOUNTER — Other Ambulatory Visit (HOSPITAL_COMMUNITY): Payer: Self-pay | Admitting: Student

## 2023-05-18 DIAGNOSIS — F10982 Alcohol use, unspecified with alcohol-induced sleep disorder: Secondary | ICD-10-CM

## 2023-05-18 DIAGNOSIS — F331 Major depressive disorder, recurrent, moderate: Secondary | ICD-10-CM

## 2023-05-18 DIAGNOSIS — Z9151 Personal history of suicidal behavior: Secondary | ICD-10-CM

## 2023-05-18 DIAGNOSIS — Z8659 Personal history of other mental and behavioral disorders: Secondary | ICD-10-CM

## 2023-05-18 DIAGNOSIS — F4310 Post-traumatic stress disorder, unspecified: Secondary | ICD-10-CM

## 2023-05-18 MED ORDER — ESCITALOPRAM OXALATE 10 MG PO TABS: 10.0000 mg | ORAL_TABLET | Freq: Every day | ORAL | 0 refills | Status: DC

## 2023-05-18 MED ORDER — TRAZODONE HCL 50 MG PO TABS: 50.0000 mg | ORAL_TABLET | Freq: Every evening | ORAL | 0 refills | Status: DC | PRN

## 2023-05-20 ENCOUNTER — Ambulatory Visit (HOSPITAL_COMMUNITY): Payer: BC Managed Care – PPO | Admitting: Student

## 2023-05-27 ENCOUNTER — Ambulatory Visit (HOSPITAL_COMMUNITY): Payer: BC Managed Care – PPO | Admitting: Student

## 2023-05-27 ENCOUNTER — Encounter (HOSPITAL_COMMUNITY): Payer: Self-pay | Admitting: Student

## 2023-05-27 VITALS — BP 145/80 | HR 80 | Ht 66.73 in | Wt 162.6 lb

## 2023-05-27 DIAGNOSIS — Z9151 Personal history of suicidal behavior: Secondary | ICD-10-CM

## 2023-05-27 DIAGNOSIS — F4310 Post-traumatic stress disorder, unspecified: Secondary | ICD-10-CM

## 2023-05-27 DIAGNOSIS — F10982 Alcohol use, unspecified with alcohol-induced sleep disorder: Secondary | ICD-10-CM

## 2023-05-27 DIAGNOSIS — F331 Major depressive disorder, recurrent, moderate: Secondary | ICD-10-CM

## 2023-05-27 DIAGNOSIS — Z72821 Inadequate sleep hygiene: Secondary | ICD-10-CM | POA: Diagnosis not present

## 2023-05-27 DIAGNOSIS — F1020 Alcohol dependence, uncomplicated: Secondary | ICD-10-CM

## 2023-05-27 DIAGNOSIS — Z8659 Personal history of other mental and behavioral disorders: Secondary | ICD-10-CM

## 2023-05-27 MED ORDER — ESCITALOPRAM OXALATE 20 MG PO TABS
20.0000 mg | ORAL_TABLET | Freq: Every day | ORAL | 1 refills | Status: AC
Start: 2023-05-27 — End: 2023-07-26

## 2023-05-27 MED ORDER — TRAZODONE HCL 50 MG PO TABS
50.0000 mg | ORAL_TABLET | Freq: Every evening | ORAL | 1 refills | Status: AC | PRN
Start: 2023-05-27 — End: 2023-07-26
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
## 2023-05-28 ENCOUNTER — Encounter (HOSPITAL_COMMUNITY): Payer: Self-pay | Admitting: Student

## 2023-05-29 NOTE — Addendum Note (Signed)
Addended by: Everlena Cooper on: 05/29/2023 06:55 PM   Modules accepted: Level of Service

## 2023-06-01 ENCOUNTER — Telehealth (INDEPENDENT_AMBULATORY_CARE_PROVIDER_SITE_OTHER): Payer: Self-pay | Admitting: Otolaryngology

## 2023-06-01 NOTE — Telephone Encounter (Signed)
LVM to confirm appt & location [id number] afm

## 2023-06-02 ENCOUNTER — Encounter (INDEPENDENT_AMBULATORY_CARE_PROVIDER_SITE_OTHER): Payer: Self-pay

## 2023-06-02 ENCOUNTER — Ambulatory Visit (INDEPENDENT_AMBULATORY_CARE_PROVIDER_SITE_OTHER): Payer: BC Managed Care – PPO

## 2023-06-02 VITALS — BP 154/73 | HR 77 | Ht 65.5 in | Wt 158.0 lb

## 2023-06-02 DIAGNOSIS — H903 Sensorineural hearing loss, bilateral: Secondary | ICD-10-CM | POA: Diagnosis not present

## 2023-06-02 DIAGNOSIS — H9312 Tinnitus, left ear: Secondary | ICD-10-CM

## 2023-06-03 ENCOUNTER — Ambulatory Visit: Payer: BC Managed Care – PPO | Admitting: Nurse Practitioner

## 2023-06-03 DIAGNOSIS — H903 Sensorineural hearing loss, bilateral: Secondary | ICD-10-CM | POA: Insufficient documentation

## 2023-06-03 DIAGNOSIS — H9312 Tinnitus, left ear: Secondary | ICD-10-CM | POA: Insufficient documentation

## 2023-06-03 NOTE — Progress Notes (Signed)
Patient ID: Kearia First July, female   DOB: March 12, 1976, 48 y.o.   MRN: [id number]  Follow-up: Persistent tinnitus  HPI: The patient is a 48 year old female who returns today for her follow-up evaluation.  She was last seen 6 months ago.  At that time, she was complaining of bilateral tinnitus, worse on the left side.  She was also noted to have bilateral sensorineural hearing loss, worse on the left side.  Her MRI scan was negative for retrocochlear lesion.  The patient returns today complaining of persistent left ear tinnitus.  She is having difficulty hearing out of the left ear.  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  She is currently being treated at a [facility].  She denies any otalgia or otorrhea.  Exam: General: Communicates without difficulty, well nourished, no acute distress. Head: Normocephalic, no evidence injury, no tenderness, facial buttresses intact without stepoff. Face/sinus: No tenderness to palpation and percussion. Facial movement is normal and symmetric. Eyes: PERRL, EOMI. No scleral icterus, conjunctivae clear. Neuro: CN II exam reveals vision grossly intact.  No nystagmus at any point of gaze. Ears: Auricles well formed without lesions.  Ear canals are intact without mass or lesion.  No erythema or edema is appreciated.  The TMs are intact without fluid. Nose: External evaluation reveals normal support and skin without lesions.  Dorsum is intact.  Anterior rhinoscopy reveals normal mucosa over anterior aspect of inferior turbinates and intact septum.  No purulence noted. Oral:  Oral cavity and oropharynx are intact, symmetric, without erythema or edema.  Mucosa is moist without lesions. Neck: Full range of motion without pain.  There is no significant lymphadenopathy.  No masses palpable.  Thyroid bed within normal limits to palpation.  Parotid glands and submandibular glands equal bilaterally without mass.   Trachea is midline. Neuro:  CN 2-12 grossly intact.   Assessment: 1.  The patient's ear canals, tympanic membranes, and middle ear spaces are normal. 2.  Bilateral moderate sensorineural hearing loss, worse on the left side.  The patient has a history of significant occupational loud noise exposure, secondary to serving in the [employer].  Her MRI scan was negative for retrocochlear lesion. 3.  Her persistent left ear tinnitus is likely a direct result of her hearing loss.  Plan: 1.  The physical exam findings are reviewed with the patient. 2.  The strategies to cope with tinnitus, including the use of masker, hearing aids, tinnitus retraining therapy, and avoidance of caffeine and alcohol are discussed.  3.  The patient is a candidate for hearing amplification.  She will benefit from hearing aid fitting at the [facility].  This will help with her hearing and tinnitus management. 4.  The patient will return for reevaluation in 1 year, sooner if needed.

## 2023-06-17 DIAGNOSIS — F4311 Post-traumatic stress disorder, acute: Secondary | ICD-10-CM | POA: Diagnosis not present

## 2023-06-17 DIAGNOSIS — F331 Major depressive disorder, recurrent, moderate: Secondary | ICD-10-CM | POA: Diagnosis not present

## 2023-06-23 DIAGNOSIS — F331 Major depressive disorder, recurrent, moderate: Secondary | ICD-10-CM | POA: Diagnosis not present

## 2023-06-23 DIAGNOSIS — F4311 Post-traumatic stress disorder, acute: Secondary | ICD-10-CM | POA: Diagnosis not present

## 2023-06-24 ENCOUNTER — Other Ambulatory Visit: Payer: Self-pay | Admitting: Physician Assistant

## 2023-06-29 ENCOUNTER — Other Ambulatory Visit: Payer: Self-pay | Admitting: Physician Assistant

## 2023-07-01 ENCOUNTER — Ambulatory Visit (HOSPITAL_COMMUNITY): Payer: BC Managed Care – PPO | Admitting: Student

## 2023-07-01 DIAGNOSIS — F4311 Post-traumatic stress disorder, acute: Secondary | ICD-10-CM | POA: Diagnosis not present

## 2023-07-01 DIAGNOSIS — F331 Major depressive disorder, recurrent, moderate: Secondary | ICD-10-CM | POA: Diagnosis not present

## 2023-07-08 ENCOUNTER — Ambulatory Visit (HOSPITAL_COMMUNITY): Payer: BC Managed Care – PPO | Admitting: Student

## 2023-07-08 ENCOUNTER — Encounter (HOSPITAL_COMMUNITY): Payer: Self-pay | Admitting: Student

## 2023-07-08 NOTE — Patient Instructions (Incomplete)
For even more resources, please see link below or search "NIAAA Finding Help" then search for "Resource" LInk  https://www.gardner-medina.com/   Treatment Finders NIAAA Alcohol Treatment Navigator--How to Find Quality Alcohol Treatment https://alcoholtreatment.niaaa.nih.gov/how-to-find-alcohol-treatment?_gl=1*72zxnr*_ga*OTE3NDI1MTkyLjE3MzI1Njc3OTc.*_ga_E2D8B2PVE9*MTc0MDU5MjE1NS4xLjEuMTc0MDU5MjM0OS42MC4wLjA.  SAMHSA's Behavioral Health Treatment Services Locator 1-800-662-HELP (480) 521-0755) http://lester.info/  Mutual-Support Groups Alcoholics Anonymous (AA)-- app for iOS and Android smartphones 339 684 2492  EscrowEtc.es  LifeRing  305-826-8176 BingoPublishing.hu  SMART Recovery  (971)121-3736 https://smartrecovery.org/  Women for Sobriety  561-581-4742 http://www.flowers.com/  Groups for Family and Friends Adult Children of Alcoholics Dysfunctional Families World Service Organization (323)858-0637 https://adultchildren.org/  Al-Anon Family Groups (310)341-4695 for meetings https://al-anon.org/  SMART Recovery Family & Friends 315-761-3091 https://smartrecovery.org/family  Online-Only Resources If you have internet access, you may wish to visit the following websites:  NIAAA Alcohol Treatment Navigator (Three-step road map to evidence-based treatment) https://alcoholtreatment.niaaa.nih.gov/?_gl=1*16ufsfu*_ga*OTE3NDI1MTkyLjE3MzI1Njc3OTc.*_ga_E2D8B2PVE9*MTc0MDU5MjE1NS4xLjEuMTc0MDU5MjM0OS42MC4wLjA.  NIAAA Facts About Teen Drinking https://niaaaforteens.niaaa.nih.gov/?_gl=1*16ufsfu*_ga*OTE3NDI1MTkyLjE3MzI1Njc3OTc.*_ga_E2D8B2PVE9*MTc0MDU5MjE1NS4xLjEuMTc0MDU5MjM0OS42MC4wLjA.  National Association of Social Workers Arts development officer for social workers with addiction specialties.) TastyShow.cz  Moderation ManagementT (Mutual-support  group) https://moderation.org/  Secular AA (Mutual-support group) https://www.sherman-hill.com/  E-Health Alcohol Treatment Tools* Below are samples of e-health tools developed with NIAAA funding. Each of these fee-based tools has a research base that shows its potential to help people cut down or quit drinking.  A-CHESS--A mobile tool to prevent a return to drinking; available from some specialty treatment providers and programs.  CBT4CBT--A self-guided, web-based cognitive-behavioral therapy program that teaches skills to help people stop or reduce drinking; health professionals can provide a prescription.  CheckUp & Choices--A digital self-help program to guide people in deciding whether to change their drinking habits and developing skills to make a change.

## 2023-07-10 DIAGNOSIS — F4311 Post-traumatic stress disorder, acute: Secondary | ICD-10-CM | POA: Diagnosis not present

## 2023-07-10 DIAGNOSIS — F331 Major depressive disorder, recurrent, moderate: Secondary | ICD-10-CM | POA: Diagnosis not present

## 2023-07-14 DIAGNOSIS — F4311 Post-traumatic stress disorder, acute: Secondary | ICD-10-CM | POA: Diagnosis not present

## 2023-07-14 DIAGNOSIS — F331 Major depressive disorder, recurrent, moderate: Secondary | ICD-10-CM | POA: Diagnosis not present

## 2023-07-21 DIAGNOSIS — F4311 Post-traumatic stress disorder, acute: Secondary | ICD-10-CM | POA: Diagnosis not present

## 2023-07-21 DIAGNOSIS — F331 Major depressive disorder, recurrent, moderate: Secondary | ICD-10-CM | POA: Diagnosis not present

## 2023-07-25 ENCOUNTER — Other Ambulatory Visit: Payer: Self-pay | Admitting: Physician Assistant

## 2023-08-22 ENCOUNTER — Other Ambulatory Visit: Payer: Self-pay | Admitting: Physician Assistant

## 2023-11-09 ENCOUNTER — Telehealth (HOSPITAL_COMMUNITY): Payer: Self-pay | Admitting: Student

## 2023-11-09 NOTE — Telephone Encounter (Signed)
 Patient stated she will be following up through the [place] and will not need to schedule appointments at [facility] office.

## 2024-03-08 ENCOUNTER — Other Ambulatory Visit: Payer: Self-pay | Admitting: Physician Assistant

## 2024-06-01 ENCOUNTER — Other Ambulatory Visit: Payer: Self-pay | Admitting: Family Medicine

## 2024-06-01 DIAGNOSIS — R928 Other abnormal and inconclusive findings on diagnostic imaging of breast: Secondary | ICD-10-CM

## 2024-06-17 ENCOUNTER — Other Ambulatory Visit: Payer: Self-pay | Admitting: Family Medicine

## 2024-06-17 DIAGNOSIS — N63 Unspecified lump in unspecified breast: Secondary | ICD-10-CM

## 2024-06-17 DIAGNOSIS — R928 Other abnormal and inconclusive findings on diagnostic imaging of breast: Secondary | ICD-10-CM

## 2024-06-17 DIAGNOSIS — N6310 Unspecified lump in the right breast, unspecified quadrant: Secondary | ICD-10-CM

## 2024-06-21 ENCOUNTER — Other Ambulatory Visit: Payer: Self-pay
